# Patient Record
Sex: Male | Born: 1955 | Race: Black or African American | Hispanic: No | Marital: Single | Smoking: Current every day smoker
Health system: Southern US, Community
[De-identification: ages and names within clinical notes are randomized; demographics above are authoritative.]

## PROBLEM LIST (undated history)

## (undated) DIAGNOSIS — C801 Malignant (primary) neoplasm, unspecified: Secondary | ICD-10-CM

## (undated) DIAGNOSIS — F329 Major depressive disorder, single episode, unspecified: Secondary | ICD-10-CM

## (undated) DIAGNOSIS — F32A Depression, unspecified: Secondary | ICD-10-CM

## (undated) DIAGNOSIS — F419 Anxiety disorder, unspecified: Secondary | ICD-10-CM

## (undated) HISTORY — PX: OTHER SURGICAL HISTORY: SHX169

## (undated) SURGERY — BRONCHOSCOPY, FLEXIBLE
Anesthesia: Moderate Sedation | Laterality: Bilateral

---

## 2004-03-16 ENCOUNTER — Other Ambulatory Visit: Payer: Self-pay

## 2004-04-20 ENCOUNTER — Other Ambulatory Visit: Payer: Self-pay

## 2004-04-20 ENCOUNTER — Emergency Department: Payer: Self-pay | Admitting: Emergency Medicine

## 2004-05-26 ENCOUNTER — Other Ambulatory Visit: Payer: Self-pay

## 2004-05-26 ENCOUNTER — Emergency Department: Payer: Self-pay | Admitting: Emergency Medicine

## 2004-06-19 ENCOUNTER — Inpatient Hospital Stay: Payer: Self-pay

## 2004-07-29 ENCOUNTER — Ambulatory Visit: Payer: Self-pay | Admitting: Family Medicine

## 2004-10-10 ENCOUNTER — Emergency Department: Payer: Self-pay | Admitting: General Practice

## 2006-02-16 ENCOUNTER — Emergency Department: Payer: Self-pay | Admitting: Emergency Medicine

## 2006-02-17 ENCOUNTER — Other Ambulatory Visit: Payer: Self-pay

## 2006-07-21 ENCOUNTER — Emergency Department: Payer: Self-pay | Admitting: Emergency Medicine

## 2006-07-21 ENCOUNTER — Other Ambulatory Visit: Payer: Self-pay

## 2009-08-15 ENCOUNTER — Emergency Department: Payer: Self-pay | Admitting: Emergency Medicine

## 2010-04-17 ENCOUNTER — Ambulatory Visit: Payer: Self-pay | Admitting: Family Medicine

## 2011-11-05 ENCOUNTER — Emergency Department: Payer: Self-pay | Admitting: Emergency Medicine

## 2011-11-05 LAB — CBC
HCT: 41.8 % (ref 40.0–52.0)
HGB: 13.3 g/dL (ref 13.0–18.0)
MCV: 81 fL (ref 80–100)
RDW: 15.4 % — ABNORMAL HIGH (ref 11.5–14.5)
WBC: 4.4 10*3/uL (ref 3.8–10.6)

## 2011-11-05 LAB — COMPREHENSIVE METABOLIC PANEL
Calcium, Total: 8.6 mg/dL (ref 8.5–10.1)
Chloride: 107 mmol/L (ref 98–107)
EGFR (African American): >60
EGFR (Non-African Amer.): >60
Glucose: 94 mg/dL (ref 65–99)
Potassium: 3.9 mmol/L (ref 3.5–5.1)
SGOT(AST): 39 U/L — ABNORMAL HIGH (ref 15–37)
SGPT (ALT): 23 U/L
Sodium: 138 mmol/L (ref 136–145)
Total Protein: 7.9 g/dL (ref 6.4–8.2)

## 2011-11-05 LAB — URINALYSIS, COMPLETE
Bilirubin,UR: NEGATIVE
Glucose,UR: NEGATIVE mg/dL (ref 0–75)
Ketone: NEGATIVE
Nitrite: NEGATIVE
Ph: 5 (ref 4.5–8.0)

## 2011-11-05 LAB — DRUG SCREEN, URINE
Amphetamines, Ur Screen: NEGATIVE (ref ?–1000)
Barbiturates, Ur Screen: NEGATIVE (ref ?–200)
Benzodiazepine, Ur Scrn: NEGATIVE (ref ?–200)
Cannabinoid 50 Ng, Ur ~~LOC~~: NEGATIVE (ref ?–50)
Tricyclic, Ur Screen: NEGATIVE (ref ?–1000)

## 2011-11-05 LAB — TSH: Thyroid Stimulating Horm: 1.48 u[IU]/mL

## 2011-11-05 LAB — SALICYLATE LEVEL: Salicylates, Serum: 2.9 mg/dL — ABNORMAL HIGH

## 2011-11-05 LAB — ETHANOL: Ethanol: 27 mg/dL

## 2012-08-20 ENCOUNTER — Emergency Department: Payer: Self-pay | Admitting: Emergency Medicine

## 2014-07-07 ENCOUNTER — Emergency Department: Payer: Self-pay | Admitting: Emergency Medicine

## 2014-07-07 LAB — DRUG SCREEN, URINE
Amphetamines, Ur Screen: NEGATIVE (ref ?–1000)
BARBITURATES, UR SCREEN: NEGATIVE (ref ?–200)
Benzodiazepine, Ur Scrn: NEGATIVE (ref ?–200)
CANNABINOID 50 NG, UR ~~LOC~~: NEGATIVE (ref ?–50)
COCAINE METABOLITE, UR ~~LOC~~: POSITIVE (ref ?–300)
MDMA (Ecstasy)Ur Screen: NEGATIVE (ref ?–500)
Methadone, Ur Screen: NEGATIVE (ref ?–300)
OPIATE, UR SCREEN: NEGATIVE (ref ?–300)
Phencyclidine (PCP) Ur S: NEGATIVE (ref ?–25)
TRICYCLIC, UR SCREEN: NEGATIVE (ref ?–1000)

## 2014-07-07 LAB — URINALYSIS, COMPLETE
BACTERIA: NONE SEEN
Bilirubin,UR: NEGATIVE
Blood: NEGATIVE
GLUCOSE, UR: NEGATIVE mg/dL (ref 0–75)
Ketone: NEGATIVE
LEUKOCYTE ESTERASE: NEGATIVE
Nitrite: NEGATIVE
Ph: 5 (ref 4.5–8.0)
Protein: NEGATIVE
RBC, UR: NONE SEEN /HPF (ref 0–5)
Specific Gravity: 1.002 (ref 1.003–1.030)
Squamous Epithelial: <1
WBC UR: 1 /HPF (ref 0–5)

## 2014-07-07 LAB — CBC
HCT: 46.5 % (ref 40.0–52.0)
HGB: 15 g/dL (ref 13.0–18.0)
MCH: 25.8 pg — ABNORMAL LOW (ref 26.0–34.0)
MCHC: 32.3 g/dL (ref 32.0–36.0)
MCV: 80 fL (ref 80–100)
Platelet: 208 10*3/uL (ref 150–440)
RBC: 5.81 10*6/uL (ref 4.40–5.90)
RDW: 15.3 % — AB (ref 11.5–14.5)
WBC: 5.9 10*3/uL (ref 3.8–10.6)

## 2014-07-07 LAB — COMPREHENSIVE METABOLIC PANEL
ALBUMIN: 4 g/dL (ref 3.4–5.0)
ALK PHOS: 70 U/L
Anion Gap: 9 (ref 7–16)
BUN: 7 mg/dL (ref 7–18)
Bilirubin,Total: 0.3 mg/dL (ref 0.2–1.0)
CALCIUM: 8.4 mg/dL — AB (ref 8.5–10.1)
Chloride: 108 mmol/L — ABNORMAL HIGH (ref 98–107)
Co2: 25 mmol/L (ref 21–32)
Creatinine: 0.96 mg/dL (ref 0.60–1.30)
EGFR (African American): >60
EGFR (Non-African Amer.): >60
GLUCOSE: 88 mg/dL (ref 65–99)
OSMOLALITY: 281 (ref 275–301)
Potassium: 4.2 mmol/L (ref 3.5–5.1)
SGOT(AST): 58 U/L — ABNORMAL HIGH (ref 15–37)
SGPT (ALT): 35 U/L
SODIUM: 142 mmol/L (ref 136–145)
TOTAL PROTEIN: 8.4 g/dL — AB (ref 6.4–8.2)

## 2014-07-07 LAB — ACETAMINOPHEN LEVEL: Acetaminophen: <2 ug/mL

## 2014-07-07 LAB — ETHANOL: ETHANOL LVL: 296 mg/dL

## 2014-07-07 LAB — SALICYLATE LEVEL: SALICYLATES, SERUM: 3.3 mg/dL — AB

## 2014-10-20 NOTE — Consult Note (Signed)
Brief Consult Note: Diagnosis: Alcohol dependence, cocaine dependence, substance inbduced mood disorder.   Patient was seen by consultant.   Consult note dictated.   Recommend further assessment or treatment.   Orders entered.   Discussed with Attending MD.   Comments: Mr. Frontera has a h/o alcoholism and mood instability. He was discharged fron [facility] on 5/3. He relapsed on alcohol imediately drinking a case of beer daily. He came to the ER complaining of auditory command hallucinations requesting rehab. He did not require alcohol detox. He is no longer suicidal or homicidal. He is no longer psychotic.   PLAN: 1. The patient is on IVC.  2. He will be transferred to [facility] re[facility] facility today.   3. He is to continue current medications of Haldol and Trazodone.  4. He was started on Cipro on 5/11 for UTI.  Electronic Signatures: Orson Slick (MD)  (Signed 13-May-13 19:11)  Authored: Brief Consult Note   Last Updated: 13-May-13 19:11 by Orson Slick (MD)

## 2014-10-20 NOTE — Consult Note (Signed)
PATIENT NAME:  Wayne Chapman, Wayne Chapman MR#:  [id number] DATE OF BIRTH:  1956/04/29  DATE OF CONSULTATION:  11/08/2011  REFERRING PHYSICIAN:  Graciella Freer, MD  CONSULTING PHYSICIAN:  Jolanta B. Pucilowska, MD  REASON FOR CONSULTATION: To evaluate a patient with alcoholism.   IDENTIFYING DATA: Mr. Bolar is a 59 year old male with history of alcoholism and psychotic depression.   CHIEF COMPLAINT: "I hear voices".   HISTORY OF PRESENT ILLNESS: Mr. Dismuke reports that he was just detoxed from alcohol at [facility] in [facility] and immediately relapsed on alcohol. He reports drinking a case of beer a day and having auditory command hallucinations telling him to kill himself. He came to the Emergency Room asking for long-term treatment. He reportedly did see Dr. Jacqualine Code at [facility] but does not have access to his medication of Haldol and trazodone that he finds useful. In the Emergency Room, he was given his medications. He did not need alcohol detox. His vital signs were stable and he did not experience any symptoms of withdrawal. He was referred to [facility] rehab facility. The patient reports symptoms of depression with poor sleep, decreased appetite, anhedonia, feeling of guilt, hopelessness, worthlessness, crying, social isolation, low energy, poor memory and concentration. He tends to blame his bipolar rather than his drinking for it. He realizes that he was unrealistic believing that five day detox was enough and now wants to get proper rehab and would appreciate placement in a treatment center. He reports on and off psychotic symptoms that are usually exacerbated by substance abuse.   PAST PSYCHIATRIC HISTORY: He reportedly was diagnosed with depression at the age of 72 at [facility] where he was also hospitalized for alcoholism. He denies suicide attempt, participation with substance abuse treatment program.   FAMILY PSYCHIATRIC HISTORY: Multiple family members with alcoholism.   ALLERGIES: No  known drug allergies.   MEDICATIONS ON ADMISSION:  1. Haldol 2 mg at night. 2. Trazodone 100 mg at night.   SOCIAL HISTORY: He is homeless and unemployed. He has no resources, no health insurance. He has not been employed for several years after he lost his job at [employer]. He is not married and reports having no children.   REVIEW OF SYSTEMS: CONSTITUTIONAL: No fevers or chills. Positive for fatigue. No weight changes. EYES: No double or blurred vision. ENT: No hearing loss. RESPIRATORY: No shortness of breath or cough. CARDIOVASCULAR: No chest pain or orthopnea. GASTROINTESTINAL: No abdominal pain, nausea, vomiting, or diarrhea. GU: No incontinence or frequency. ENDOCRINE: No heat or cold intolerance. LYMPHATIC: No anemia or easy bruising. INTEGUMENTARY: No acne or rash. MUSCULOSKELETAL: No muscle or joint pain. NEUROLOGIC: No tingling or weakness. PSYCHIATRIC: See history of present illness for details.   PHYSICAL EXAMINATION:   VITAL SIGNS: Blood pressure 119/71, pulse 55, respirations 16, temperature 96.2.   GENERAL: This is a slender male in no acute distress. The rest of the physical examination is deferred to his primary attending.   LABORATORY DATA: Chemistries are within normal limits. Blood alcohol level 0.027. LFTs within normal limits except for AST of 39. TSH 1.48. Urine tox screen positive for cocaine. CBC within normal limits. Urinalysis is suggestive of urinary tract infection confirmed by urine culture. Serum acetaminophen less than 2. Serum salicylates 2.9.   MENTAL STATUS EXAMINATION: The patient is alert and oriented to person, place, time, and situation. He is pleasant, polite, and cooperative. He is wearing hospital scrubs. He is adequately groomed. He took a shower today. He maintains  good eye contact. His speech is of normal rhythm, rate, and volume. Mood is fine with full affect. Thought processing is logical and goal oriented. Thought content he denies suicidal or homicidal  ideation. There are no delusions or paranoia. There are no auditory or visual hallucinations. His cognition is grossly intact. His insight and judgment are questionable.   SUICIDE RISK ASSESSMENT: This is a patient with a lifelong history of substance abuse and mood problems who became psychotic after he relapsed on alcohol. He now is ready to start substance abuse rehab program.    DIAGNOSES:  AXIS I:  1. Alcohol dependence.  2. Cocaine dependence.  3. Mood disorder, not otherwise specified.   AXIS II: Deferred.   AXIS III: None.   AXIS IV: Mental illness, substance abuse, financial, employment, housing, primary support, access to care.   AXIS V: GAF 40.   PLAN:  1. The patient was accepted to [facility] re[facility] facility. He will be transferred today.  2. He is to continue trazodone and Haldol.  3. He was started on Cipro on May 11th for urinary tract infection and is to continue treatment.   ____________________________ Wardell Honour. Bary Leriche, MD jbp:drc D: 11/08/2011 19:20:17 ET T: 11/09/2011 10:13:32 ET JOB#: [id number]  cc: Jolanta B. Bary Leriche, MD, <Dictator> Clovis Fredrickson MD ELECTRONICALLY SIGNED 11/09/2011 22:16

## 2017-08-09 ENCOUNTER — Emergency Department: Payer: Medicare Other

## 2017-08-09 ENCOUNTER — Inpatient Hospital Stay
Admission: EM | Admit: 2017-08-09 | Discharge: 2017-08-12 | DRG: 177 | Disposition: A | Discharge status: Home / Self Care | Payer: Medicare Other | Attending: Internal Medicine | Admitting: Internal Medicine

## 2017-08-09 ENCOUNTER — Other Ambulatory Visit: Payer: Self-pay

## 2017-08-09 ENCOUNTER — Encounter: Payer: Self-pay | Admitting: Emergency Medicine

## 2017-08-09 DIAGNOSIS — R042 Hemoptysis: Secondary | ICD-10-CM | POA: Diagnosis present

## 2017-08-09 DIAGNOSIS — E876 Hypokalemia: Secondary | ICD-10-CM | POA: Diagnosis present

## 2017-08-09 DIAGNOSIS — R112 Nausea with vomiting, unspecified: Secondary | ICD-10-CM

## 2017-08-09 DIAGNOSIS — F17210 Nicotine dependence, cigarettes, uncomplicated: Secondary | ICD-10-CM | POA: Diagnosis present

## 2017-08-09 DIAGNOSIS — Z681 Body mass index (BMI) 19 or less, adult: Secondary | ICD-10-CM

## 2017-08-09 DIAGNOSIS — J984 Other disorders of lung: Secondary | ICD-10-CM | POA: Diagnosis present

## 2017-08-09 DIAGNOSIS — X58XXXA Exposure to other specified factors, initial encounter: Secondary | ICD-10-CM | POA: Diagnosis present

## 2017-08-09 DIAGNOSIS — F1410 Cocaine abuse, uncomplicated: Secondary | ICD-10-CM | POA: Diagnosis present

## 2017-08-09 DIAGNOSIS — E871 Hypo-osmolality and hyponatremia: Secondary | ICD-10-CM | POA: Diagnosis present

## 2017-08-09 DIAGNOSIS — Z8611 Personal history of tuberculosis: Secondary | ICD-10-CM

## 2017-08-09 DIAGNOSIS — R61 Generalized hyperhidrosis: Secondary | ICD-10-CM | POA: Diagnosis present

## 2017-08-09 DIAGNOSIS — J690 Pneumonitis due to inhalation of food and vomit: Secondary | ICD-10-CM | POA: Diagnosis present

## 2017-08-09 DIAGNOSIS — T17818A Gastric contents in other parts of respiratory tract causing other injury, initial encounter: Secondary | ICD-10-CM | POA: Diagnosis present

## 2017-08-09 DIAGNOSIS — Z7151 Drug abuse counseling and surveillance of drug abuser: Secondary | ICD-10-CM

## 2017-08-09 DIAGNOSIS — Z8659 Personal history of other mental and behavioral disorders: Secondary | ICD-10-CM

## 2017-08-09 DIAGNOSIS — E43 Unspecified severe protein-calorie malnutrition: Secondary | ICD-10-CM

## 2017-08-09 DIAGNOSIS — J850 Gangrene and necrosis of lung: Secondary | ICD-10-CM | POA: Diagnosis not present

## 2017-08-09 DIAGNOSIS — B9689 Other specified bacterial agents as the cause of diseases classified elsewhere: Secondary | ICD-10-CM | POA: Diagnosis present

## 2017-08-09 DIAGNOSIS — R402413 Glasgow coma scale score 13-15, at hospital admission: Secondary | ICD-10-CM | POA: Diagnosis present

## 2017-08-09 DIAGNOSIS — F1020 Alcohol dependence, uncomplicated: Secondary | ICD-10-CM | POA: Diagnosis present

## 2017-08-09 DIAGNOSIS — Z23 Encounter for immunization: Secondary | ICD-10-CM

## 2017-08-09 HISTORY — DX: Major depressive disorder, single episode, unspecified: F32.9

## 2017-08-09 HISTORY — DX: Depression, unspecified: F32.A

## 2017-08-09 HISTORY — DX: Anxiety disorder, unspecified: F41.9

## 2017-08-09 LAB — URINALYSIS, COMPLETE (UACMP) WITH MICROSCOPIC
BACTERIA UA: NONE SEEN
Bilirubin Urine: NEGATIVE
Glucose, UA: NEGATIVE mg/dL
Ketones, ur: NEGATIVE mg/dL
Leukocytes, UA: NEGATIVE
Nitrite: NEGATIVE
PH: 7 (ref 5.0–8.0)
Protein, ur: NEGATIVE mg/dL
SPECIFIC GRAVITY, URINE: 1.003 — AB (ref 1.005–1.030)
Squamous Epithelial / LPF: NONE SEEN

## 2017-08-09 LAB — URINE DRUG SCREEN, QUALITATIVE (ARMC ONLY)
Amphetamines, Ur Screen: NOT DETECTED
BENZODIAZEPINE, UR SCRN: NOT DETECTED
Barbiturates, Ur Screen: NOT DETECTED
Cannabinoid 50 Ng, Ur ~~LOC~~: NOT DETECTED
Cocaine Metabolite,Ur ~~LOC~~: POSITIVE — AB
MDMA (Ecstasy)Ur Screen: NOT DETECTED
METHADONE SCREEN, URINE: NOT DETECTED
OPIATE, UR SCREEN: NOT DETECTED
Phencyclidine (PCP) Ur S: NOT DETECTED
Tricyclic, Ur Screen: NOT DETECTED

## 2017-08-09 LAB — CBC WITH DIFFERENTIAL/PLATELET
BASOS ABS: 0 10*3/uL (ref 0–0.1)
Basophils Relative: 0 %
Eosinophils Absolute: 0 10*3/uL (ref 0–0.7)
Eosinophils Relative: 0 %
HEMATOCRIT: 41.2 % (ref 40.0–52.0)
Hemoglobin: 13.4 g/dL (ref 13.0–18.0)
LYMPHS PCT: 11 %
Lymphs Abs: 0.9 10*3/uL — ABNORMAL LOW (ref 1.0–3.6)
MCH: 24.9 pg — ABNORMAL LOW (ref 26.0–34.0)
MCHC: 32.5 g/dL (ref 32.0–36.0)
MCV: 76.6 fL — AB (ref 80.0–100.0)
MONO ABS: 1.5 10*3/uL — AB (ref 0.2–1.0)
Monocytes Relative: 18 %
NEUTROS ABS: 5.9 10*3/uL (ref 1.4–6.5)
Neutrophils Relative %: 71 %
Platelets: 218 10*3/uL (ref 150–440)
RBC: 5.38 MIL/uL (ref 4.40–5.90)
RDW: 15 % — AB (ref 11.5–14.5)
WBC: 8.3 10*3/uL (ref 3.8–10.6)

## 2017-08-09 LAB — TROPONIN I: Troponin I: <0.03 ng/mL (ref <0.03)

## 2017-08-09 LAB — COMPREHENSIVE METABOLIC PANEL
ALT: 13 U/L — AB (ref 17–63)
AST: 26 U/L (ref 15–41)
Albumin: 3.1 g/dL — ABNORMAL LOW (ref 3.5–5.0)
Alkaline Phosphatase: 65 U/L (ref 38–126)
Anion gap: 10 (ref 5–15)
BILIRUBIN TOTAL: 0.8 mg/dL (ref 0.3–1.2)
BUN: 7 mg/dL (ref 6–20)
CALCIUM: 8.3 mg/dL — AB (ref 8.9–10.3)
CO2: 28 mmol/L (ref 22–32)
CREATININE: 0.72 mg/dL (ref 0.61–1.24)
Chloride: 89 mmol/L — ABNORMAL LOW (ref 101–111)
GFR calc Af Amer: >60 mL/min (ref >60)
Glucose, Bld: 124 mg/dL — ABNORMAL HIGH (ref 65–99)
POTASSIUM: 3 mmol/L — AB (ref 3.5–5.1)
Sodium: 127 mmol/L — ABNORMAL LOW (ref 135–145)
TOTAL PROTEIN: 7.8 g/dL (ref 6.5–8.1)

## 2017-08-09 LAB — TSH: TSH: 1.377 u[IU]/mL (ref 0.350–4.500)

## 2017-08-09 LAB — EXPECTORATED SPUTUM ASSESSMENT W REFEX TO RESP CULTURE: SPECIAL REQUESTS: NORMAL

## 2017-08-09 LAB — INFLUENZA PANEL BY PCR (TYPE A & B)
INFLBPCR: NEGATIVE
Influenza A By PCR: NEGATIVE

## 2017-08-09 LAB — MRSA PCR SCREENING: MRSA BY PCR: NEGATIVE

## 2017-08-09 LAB — GLUCOSE, CAPILLARY: Glucose-Capillary: 115 mg/dL — ABNORMAL HIGH (ref 65–99)

## 2017-08-09 LAB — LIPASE, BLOOD: LIPASE: 17 U/L (ref 11–51)

## 2017-08-09 LAB — EXPECTORATED SPUTUM ASSESSMENT W GRAM STAIN, RFLX TO RESP C

## 2017-08-09 LAB — ETHANOL: Alcohol, Ethyl (B): <10 mg/dL (ref <10)

## 2017-08-09 MED ORDER — ACETAMINOPHEN 650 MG RE SUPP: 650.0000 mg | Freq: Four times a day (QID) | RECTAL | Status: DC | PRN

## 2017-08-09 MED ORDER — ONDANSETRON HCL 4 MG/2ML IJ SOLN
4.0000 mg | Freq: Once | INTRAMUSCULAR | Status: AC
  Administered 2017-08-09: 4 mg via INTRAVENOUS
  Filled 2017-08-09: qty 2

## 2017-08-09 MED ORDER — ONDANSETRON HCL 4 MG/2ML IJ SOLN: 4.0000 mg | Freq: Four times a day (QID) | INTRAMUSCULAR | Status: DC | PRN

## 2017-08-09 MED ORDER — ENSURE ENLIVE PO LIQD
237.0000 mL | Freq: Three times a day (TID) | ORAL | Status: DC
  Administered 2017-08-09 – 2017-08-12 (×7): 237 mL via ORAL

## 2017-08-09 MED ORDER — PIPERACILLIN-TAZOBACTAM 3.375 G IVPB
3.3750 g | Freq: Three times a day (TID) | INTRAVENOUS | Status: DC
  Administered 2017-08-09 (×2): 3.375 g via INTRAVENOUS
  Filled 2017-08-09 (×2): qty 50

## 2017-08-09 MED ORDER — VANCOMYCIN HCL IN DEXTROSE 1-5 GM/200ML-% IV SOLN
1000.0000 mg | Freq: Two times a day (BID) | INTRAVENOUS | Status: DC
  Administered 2017-08-09: 1000 mg via INTRAVENOUS
  Filled 2017-08-09 (×2): qty 200

## 2017-08-09 MED ORDER — PIPERACILLIN-TAZOBACTAM 3.375 G IVPB
3.3750 g | Freq: Three times a day (TID) | INTRAVENOUS | Status: DC
  Administered 2017-08-10 – 2017-08-12 (×8): 3.375 g via INTRAVENOUS
  Filled 2017-08-09 (×8): qty 50

## 2017-08-09 MED ORDER — KETOROLAC TROMETHAMINE 30 MG/ML IJ SOLN
30.0000 mg | Freq: Once | INTRAMUSCULAR | Status: AC
  Administered 2017-08-09: 30 mg via INTRAVENOUS
  Filled 2017-08-09: qty 1

## 2017-08-09 MED ORDER — ACETAMINOPHEN 325 MG PO TABS: 650.0000 mg | ORAL_TABLET | Freq: Four times a day (QID) | ORAL | Status: DC | PRN

## 2017-08-09 MED ORDER — SODIUM CHLORIDE 0.9 % IV BOLUS (SEPSIS)
1000.0000 mL | Freq: Once | INTRAVENOUS | Status: AC
  Administered 2017-08-09: 1000 mL via INTRAVENOUS

## 2017-08-09 MED ORDER — SODIUM CHLORIDE 0.9 % IV SOLN
INTRAVENOUS | Status: DC
  Administered 2017-08-09: 09:00:00 via INTRAVENOUS

## 2017-08-09 MED ORDER — INFLUENZA VAC SPLIT QUAD 0.5 ML IM SUSY
0.5000 mL | PREFILLED_SYRINGE | INTRAMUSCULAR | Status: AC
  Administered 2017-08-11: 0.5 mL via INTRAMUSCULAR
  Filled 2017-08-09: qty 0.5

## 2017-08-09 MED ORDER — ONDANSETRON HCL 4 MG PO TABS: 4.0000 mg | ORAL_TABLET | Freq: Four times a day (QID) | ORAL | Status: DC | PRN

## 2017-08-09 MED ORDER — CEFTRIAXONE SODIUM 1 G IJ SOLR: 1.0000 g | Freq: Once | INTRAMUSCULAR | Status: DC

## 2017-08-09 MED ORDER — ADULT MULTIVITAMIN W/MINERALS CH
1.0000 | ORAL_TABLET | Freq: Every day | ORAL | Status: DC
  Administered 2017-08-09 – 2017-08-12 (×4): 1 via ORAL
  Filled 2017-08-09 (×4): qty 1

## 2017-08-09 MED ORDER — DOCUSATE SODIUM 100 MG PO CAPS
100.0000 mg | ORAL_CAPSULE | Freq: Two times a day (BID) | ORAL | Status: DC
  Administered 2017-08-09 – 2017-08-10 (×3): 100 mg via ORAL
  Filled 2017-08-09 (×6): qty 1

## 2017-08-09 MED ORDER — FOLIC ACID 1 MG PO TABS
1.0000 mg | ORAL_TABLET | Freq: Every day | ORAL | Status: DC
  Administered 2017-08-09 – 2017-08-12 (×4): 1 mg via ORAL
  Filled 2017-08-09 (×4): qty 1

## 2017-08-09 MED ORDER — SODIUM CHLORIDE 0.9 % IV SOLN
500.0000 mg | Freq: Once | INTRAVENOUS | Status: AC
  Administered 2017-08-09: 500 mg via INTRAVENOUS
  Filled 2017-08-09: qty 500

## 2017-08-09 MED ORDER — LORAZEPAM 2 MG/ML IJ SOLN: 1.0000 mg | Freq: Four times a day (QID) | INTRAMUSCULAR | Status: AC | PRN

## 2017-08-09 MED ORDER — LORAZEPAM 2 MG/ML IJ SOLN: 0.0000 mg | Freq: Two times a day (BID) | INTRAMUSCULAR | Status: DC

## 2017-08-09 MED ORDER — LORAZEPAM 1 MG PO TABS: 1.0000 mg | ORAL_TABLET | Freq: Four times a day (QID) | ORAL | Status: AC | PRN

## 2017-08-09 MED ORDER — POTASSIUM CHLORIDE IN NACL 20-0.9 MEQ/L-% IV SOLN
INTRAVENOUS | Status: DC
  Administered 2017-08-09 – 2017-08-10 (×2): via INTRAVENOUS
  Filled 2017-08-09 (×3): qty 1000

## 2017-08-09 MED ORDER — IOPAMIDOL (ISOVUE-300) INJECTION 61%
75.0000 mL | Freq: Once | INTRAVENOUS | Status: AC | PRN
  Administered 2017-08-09: 75 mL via INTRAVENOUS

## 2017-08-09 MED ORDER — VANCOMYCIN HCL IN DEXTROSE 1-5 GM/200ML-% IV SOLN
1000.0000 mg | Freq: Once | INTRAVENOUS | Status: AC
  Administered 2017-08-09: 1000 mg via INTRAVENOUS
  Filled 2017-08-09: qty 200

## 2017-08-09 MED ORDER — SODIUM CHLORIDE 0.9 % IV SOLN
1.0000 g | Freq: Three times a day (TID) | INTRAVENOUS | Status: DC
  Administered 2017-08-09: 1 g via INTRAVENOUS
  Filled 2017-08-09 (×3): qty 1

## 2017-08-09 MED ORDER — THIAMINE HCL 100 MG/ML IJ SOLN: 100.0000 mg | Freq: Every day | INTRAMUSCULAR | Status: DC

## 2017-08-09 MED ORDER — POTASSIUM CHLORIDE CRYS ER 20 MEQ PO TBCR
40.0000 meq | EXTENDED_RELEASE_TABLET | ORAL | Status: AC
  Administered 2017-08-09 (×2): 40 meq via ORAL
  Filled 2017-08-09: qty 2

## 2017-08-09 MED ORDER — LORAZEPAM 2 MG/ML IJ SOLN: 0.0000 mg | Freq: Four times a day (QID) | INTRAMUSCULAR | Status: DC

## 2017-08-09 MED ORDER — VITAMIN B-1 100 MG PO TABS
100.0000 mg | ORAL_TABLET | Freq: Every day | ORAL | Status: DC
  Administered 2017-08-09 – 2017-08-12 (×4): 100 mg via ORAL
  Filled 2017-08-09 (×4): qty 1

## 2017-08-09 NOTE — Care Management (Signed)
RNCM team to follow for medication needs that can be determined closer to discharge. Patient placed in OBServation bed in ICU unit. Patient is currently being tested for TB also.

## 2017-08-09 NOTE — ED Triage Notes (Signed)
Patient complaining of emesis, coughing up blood and abd pain and black stools.  Patient coming in EMS from home and given 215ml of NS on truck. Patient did some recreational drugs on Saturday.

## 2017-08-09 NOTE — Consult Note (Signed)
Pharmacy Antibiotic Note  Wayne Chapman is a 62 y.o. male admitted on 08/09/2017 with Necrotizing Pneumonia.   Pharmacy has been consulted for Vancomycin and Zosyn  Dosing. Patient received Vancomycin 1g IV x 1 dose.   Plan: Ke: 0.071   VD: 42.4  T1.2: 9.76  Start vancomycin 1g IV every 12 hours with 6 hour stack dosing. Trough ordered prior to 4th dose. Calculated trough at Css is 19. Will monitor renal function and adjust dose as needed.   Start Zosyn 3.375 IV EI every 8 hours.    Height: 6\' 1"  (185.4 cm) Weight: 133 lb 6.1 oz (60.5 kg) IBW/kg (Calculated) : 79.9  Temp (24hrs), Avg:98.6 F (37 C), Min:98.1 F (36.7 C), Max:99.1 F (37.3 C)  Recent Labs  Lab 08/09/17 0116  WBC 8.3  CREATININE 0.72    Estimated Creatinine Clearance: 84 mL/min (by C-G formula based on SCr of 0.72 mg/dL).    No Known Allergies  Antimicrobials this admission: 2/12 vancomycin >>  2/12 Zosyn  >>   Dose adjustments this admission:  Microbiology results: 2/12 BCx: pending 2/12  MRSA PCR: negative   Thank you for allowing pharmacy to be a part of this patient's care.  Pernell Dupre, PharmD, BCPS Clinical Pharmacist 08/09/2017 11:22 AM

## 2017-08-09 NOTE — Consult Note (Signed)
[facility] Infectious Disease     Reason for Consult: PNA    Referring Physician: Claria Dice Date of Admission:  08/09/2017   Active Problems:   Hemoptysis   Protein-calorie malnutrition, severe   HPI: Wayne Chapman is a 62 y.o. male admitted with necrotizing PNA. He reports that he developed a cough approx 3 days ago after he celebrated his birtday on 2/8. He did drink more than usual that night and did vomit some.  He then developed worsening productive cough, sob and fevers. He has hx of tobacco and etoh use (3-4 40 oz beer daily) as well as cocaine use. He lives with his wife, is on disability. 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  He has a hx as well of incarceration.  On further questioning he does report a month long hx of not feeling well with some weight loss due to poor appetite and some night sweats.    Past Medical History:  Diagnosis Date  . Anxiety   . Depression    Past Surgical History:  Procedure Laterality Date  . none     Social History   Tobacco Use  . Smoking status: Current Every Day Smoker    Packs/day: 1.00    Types: Cigarettes  . Smokeless tobacco: Never Used  Substance Use Topics  . Alcohol use: Yes    Comment: alot   . Drug use: Yes   Family History  Problem Relation Age of Onset  . Diabetes Mellitus II Mother     Allergies: No Known Allergies  Current antibiotics: Antibiotics Given (last 72 hours)    Date/Time Action Medication Dose Rate   08/09/17 0723 New Bag/Given   meropenem (MERREM) 1 g in sodium chloride 0.9 % 100 mL IVPB 1 g 200 mL/hr   08/09/17 0724 New Bag/Given   azithromycin (ZITHROMAX) 500 mg in sodium chloride 0.9 % 250 mL IVPB 500 mg 250 mL/hr   08/09/17 0912 New Bag/Given   vancomycin (VANCOCIN) IVPB 1000 mg/200 mL premix 1,000 mg 200 mL/hr   08/09/17 0913 New Bag/Given   piperacillin-tazobactam (ZOSYN) IVPB 3.375 g 3.375 g 12.5 mL/hr       MEDICATIONS: . docusate sodium  100 mg Oral BID  . feeding supplement (ENSURE ENLIVE)  237 mL Oral TID BM  . folic acid  1 mg Oral Daily  . [START ON 08/10/2017] Influenza vac split quadrivalent PF  0.5 mL Intramuscular Tomorrow-1000  . LORazepam  0-4 mg Intravenous Q6H   Followed by  . [START ON 08/11/2017] LORazepam  0-4 mg Intravenous Q12H  . multivitamin with minerals  1 tablet Oral Daily  . thiamine  100 mg Oral Daily   Or  . thiamine  100 mg Intravenous Daily    Review of Systems - 11 systems reviewed and negative per HPI   OBJECTIVE: Temp:  [98.1 F (36.7 C)-99.1 F (37.3 C)] 98.1 F (36.7 C) (02/12 0830) Pulse Rate:  [73-102] 73 (02/12 0830) Resp:  [18-26] 22 (02/12 0730) BP: (86-107)/(65-68) 92/68 (02/12 0830) SpO2:  [97 %-99 %] 99 % (02/12 0730) Weight:  [60.5 kg (133 lb 6.1 oz)-73.5 kg (162 lb)] 60.5 kg (133 lb 6.1 oz) (02/12 0830) Physical Exam  Constitutional: He is oriented to person, place, and time. Thin, nad HENT: anicteric, muddy sclera Mouth/Throat: Oropharynx is clear and moist. Edentulous, has dentures Cardiovascular: Normal rate, regular rhythm and normal heart sounds. Exam  reveals no gallop and no friction rub.  No murmur heard.  Pulmonary/Chest: bil rhonhci Abdominal: Soft. Bowel sounds are normal. He exhibits no distension. There is no tenderness.  Lymphadenopathy:  He has no cervical adenopathy.  Neurological: He is alert and oriented to person, place, and time.  Skin: Skin is warm and dry. No rash noted. No erythema.  Psychiatric: He has a normal mood and affect. His behavior is normal.     LABS: Results for orders placed or performed during the hospital encounter of 08/09/17 (from the past 48 hour(s))  CBC with Differential     Status: Abnormal   Collection Time: 08/09/17  1:16 AM  Result Value Ref Range   WBC 8.3 3.8 - 10.6 K/uL   RBC 5.38 4.40 - 5.90 MIL/uL   Hemoglobin 13.4 13.0 - 18.0 g/dL   HCT 41.2 40.0 - 52.0 %   MCV 76.6 (L)  80.0 - 100.0 fL   MCH 24.9 (L) 26.0 - 34.0 pg   MCHC 32.5 32.0 - 36.0 g/dL   RDW 15.0 (H) 11.5 - 14.5 %   Platelets 218 150 - 440 K/uL   Neutrophils Relative % 71 %   Neutro Abs 5.9 1.4 - 6.5 K/uL   Lymphocytes Relative 11 %   Lymphs Abs 0.9 (L) 1.0 - 3.6 K/uL   Monocytes Relative 18 %   Monocytes Absolute 1.5 (H) 0.2 - 1.0 K/uL   Eosinophils Relative 0 %   Eosinophils Absolute 0.0 0 - 0.7 K/uL   Basophils Relative 0 %   Basophils Absolute 0.0 0 - 0.1 K/uL    Comment: Performed at [facility], [address] [place], [place] [address]  Comprehensive metabolic panel     Status: Abnormal   Collection Time: 08/09/17  1:16 AM  Result Value Ref Range   Sodium 127 (L) 135 - 145 mmol/L   Potassium 3.0 (L) 3.5 - 5.1 mmol/L   Chloride 89 (L) 101 - 111 mmol/L   CO2 28 22 - 32 mmol/L   Glucose, Bld 124 (H) 65 - 99 mg/dL   BUN 7 6 - 20 mg/dL   Creatinine, Ser 0.72 0.61 - 1.24 mg/dL   Calcium 8.3 (L) 8.9 - 10.3 mg/dL   Total Protein 7.8 6.5 - 8.1 g/dL   Albumin 3.1 (L) 3.5 - 5.0 g/dL   AST 26 15 - 41 U/L   ALT 13 (L) 17 - 63 U/L   Alkaline Phosphatase 65 38 - 126 U/L   Total Bilirubin 0.8 0.3 - 1.2 mg/dL   GFR calc non Af Amer >60 >60 mL/min   GFR calc Af Amer >60 >60 mL/min    Comment: (NOTE) The eGFR has been calculated using the CKD EPI equation. This calculation has not been validated in all clinical situations. eGFR's persistently <60 mL/min signify possible Chronic Kidney Disease.    Anion gap 10 5 - 15    Comment: Performed at [facility], [address] [place], [place] [address]  Lipase, blood     Status: None   Collection Time: 08/09/17  1:16 AM  Result Value Ref Range   Lipase 17 11 - 51 U/L    Comment: Performed at [facility], [address] [place], [place] [address]  Troponin I     Status: None   Collection Time: 08/09/17  1:16 AM  Result Value Ref Range   Troponin I <0.03 <0.03 ng/mL    Comment: Performed at [facility], [address] [place], [place] [address]  Ethanol     Status: None   Collection Time: 08/09/17  1:16 AM  Result Value Ref Range   Alcohol, Ethyl (B) <10 <10 mg/dL    Comment:        LOWEST DETECTABLE LIMIT FOR SERUM ALCOHOL IS 10 mg/dL FOR MEDICAL PURPOSES ONLY Performed at [facility], [address] [place], [place] [address]   Urinalysis, Complete w Microscopic     Status: Abnormal   Collection Time: 08/09/17  1:18 AM  Result Value Ref Range   Color, Urine YELLOW (A) YELLOW   APPearance CLEAR (A) CLEAR   Specific Gravity, Urine 1.003 (L) 1.005 - 1.030   pH 7.0 5.0 - 8.0   Glucose, UA NEGATIVE NEGATIVE mg/dL   Hgb urine dipstick SMALL (A) NEGATIVE   Bilirubin Urine NEGATIVE NEGATIVE   Ketones, ur NEGATIVE NEGATIVE mg/dL   Protein, ur NEGATIVE NEGATIVE mg/dL   Nitrite NEGATIVE NEGATIVE   Leukocytes, UA NEGATIVE NEGATIVE   RBC / HPF 0-5 0 - 5 RBC/hpf   WBC, UA 0-5 0 - 5 WBC/hpf   Bacteria, UA NONE SEEN NONE SEEN   Squamous Epithelial / LPF NONE SEEN NONE SEEN    Comment: Performed at [facility], [address] [place], [place] [address]  Urine Drug Screen, Qualitative ([facility] only)     Status: Abnormal   Collection Time: 08/09/17  1:18 AM  Result Value Ref Range   Tricyclic, Ur Screen NONE DETECTED NONE DETECTED   Amphetamines, Ur Screen NONE DETECTED NONE DETECTED   MDMA (Ecstasy)Ur Screen NONE DETECTED NONE DETECTED   Cocaine Metabolite,Ur [place] POSITIVE (A) NONE DETECTED   Opiate, Ur Screen NONE DETECTED NONE DETECTED   Phencyclidine (PCP) Ur S NONE DETECTED NONE DETECTED   Cannabinoid 50 Ng, Ur [place] NONE DETECTED NONE DETECTED   Barbiturates, Ur Screen NONE DETECTED NONE DETECTED   Benzodiazepine, Ur Scrn NONE DETECTED NONE DETECTED   Methadone Scn, Ur NONE DETECTED NONE DETECTED    Comment: (NOTE) Tricyclics + metabolites, urine    Cutoff 1000 ng/mL Amphetamines + metabolites, urine  Cutoff 1000 ng/mL MDMA (Ecstasy), urine               Cutoff 500 ng/mL Cocaine Metabolite, urine          Cutoff 300 ng/mL Opiate + metabolites, urine        Cutoff 300 ng/mL Phencyclidine (PCP), urine         Cutoff 25 ng/mL Cannabinoid, urine                 Cutoff 50 ng/mL Barbiturates + metabolites, urine  Cutoff 200 ng/mL Benzodiazepine, urine              Cutoff 200 ng/mL Methadone, urine                   Cutoff 300 ng/mL The urine drug screen provides only a preliminary, unconfirmed analytical test result and should not be used for non-medical purposes. Clinical consideration and professional judgment should be applied to any positive drug screen result due to possible interfering substances. A more specific alternate chemical method must be used in order to obtain a confirmed analytical result. Gas chromatography / mass spectrometry (GC/MS) is the preferred confirmat ory method. Performed at [facility], [address] [place], [place] [address]   Influenza panel by PCR (type A & B)     Status: None   Collection Time:  08/09/17  1:18 AM  Result Value Ref Range   Influenza A By PCR NEGATIVE NEGATIVE   Influenza B By PCR NEGATIVE NEGATIVE    Comment: (NOTE) The Xpert Xpress Flu assay is intended as an aid in the diagnosis of  influenza and should not be used as a sole basis for treatment.  This  assay is FDA approved for nasopharyngeal swab specimens only. Nasal  washings and aspirates are unacceptable for Xpert Xpress Flu testing. Performed at [facility], [address] [place], [place] [address]   Glucose, capillary     Status: Abnormal   Collection Time: 08/09/17  8:29 AM  Result Value Ref Range   Glucose-Capillary 115 (H) 65 - 99 mg/dL  MRSA PCR Screening     Status: None   Collection Time: 08/09/17  8:42 AM  Result Value Ref Range   MRSA by PCR NEGATIVE NEGATIVE    Comment:        The GeneXpert MRSA Assay (FDA approved for NASAL specimens only), is one component of a comprehensive MRSA  colonization surveillance program. It is not intended to diagnose MRSA infection nor to guide or monitor treatment for MRSA infections. Performed at [facility], [address] [place], [place] [address]   TSH     Status: None   Collection Time: 08/09/17 10:14 AM  Result Value Ref Range   TSH 1.377 0.350 - 4.500 uIU/mL    Comment: Performed by a 3rd Generation assay with a functional sensitivity of <=0.01 uIU/mL. Performed at [facility], [address] [place], [place] [address]   Culture, expectorated sputum-assessment     Status: None   Collection Time: 08/09/17 11:44 AM  Result Value Ref Range   Specimen Description Expect. Sput    Special Requests Normal    Sputum evaluation      THIS SPECIMEN IS ACCEPTABLE FOR SPUTUM CULTURE Performed at [facility], [address] [place], [place] [address]    Report Status 08/09/2017 FINAL    No components found for: ESR, C REACTIVE PROTEIN MICRO: Recent Results (from the past 720 hour(s))  MRSA PCR Screening     Status: None   Collection Time: 08/09/17  8:42 AM  Result Value Ref Range Status   MRSA by PCR NEGATIVE NEGATIVE Final    Comment:        The GeneXpert MRSA Assay (FDA approved for NASAL specimens only), is one component of a comprehensive MRSA colonization surveillance program. It is not intended to diagnose MRSA infection nor to guide or monitor treatment for MRSA infections. Performed at [facility], [address] [place], [place] [address]   Culture, expectorated sputum-assessment     Status: None   Collection Time: 08/09/17 11:44 AM  Result Value Ref Range Status   Specimen Description Expect. Sput  Final   Special Requests Normal  Final   Sputum evaluation   Final    THIS SPECIMEN IS ACCEPTABLE FOR SPUTUM CULTURE Performed at [facility], [address] [place], [place] [address]    Report Status 08/09/2017 FINAL  Final    IMAGING: Dg Chest 2  View  Result Date: 08/09/2017 CLINICAL DATA:  Acute onset of vomiting and hemoptysis. EXAM: CHEST  2 VIEW COMPARISON:  Chest radiograph performed 04/17/2010 FINDINGS: A new right apical cavitary mass is noted, measuring perhaps 9-10 cm. Malignancy is a concern. Alternatively, atypical infection might have a similar appearance. No pleural effusion or pneumothorax is seen. The heart  is normal in size. No acute osseous abnormalities are identified. IMPRESSION: New right apical cavitary mass, measuring perhaps 9-10 cm. Malignancy is a concern. Alternatively, atypical infection might have a similar appearance. CT of the chest is recommended for further evaluation. These results were called by telephone at the time of interpretation on 08/09/2017 at 3:20 am to Dr. Charlesetta Ivory, who verbally acknowledged these results. Electronically Signed   By: Garald Balding M.D.   On: 08/09/2017 03:21   Ct Chest W Contrast  Result Date: 08/09/2017 CLINICAL DATA:  62 y/o M; emesis, hemoptysis, abdominal pain, black stools. EXAM: CT CHEST WITH CONTRAST TECHNIQUE: Multidetector CT imaging of the chest was performed during intravenous contrast administration. CONTRAST:  65m ISOVUE-300 IOPAMIDOL (ISOVUE-300) INJECTION 61% COMPARISON:  08/09/2017 chest radiograph FINDINGS: Cardiovascular: Normal caliber thoracic aorta with mild calcific atherosclerosis. Moderate coronary artery calcification. Normal heart size. No pericardial effusion. Mediastinum/Nodes: Right supraclavicular, right paratracheal, right hilar adenopathy. Right supraclavicular node measures 10 x 13 mm (series 2: Image 17), right lower paratracheal measures 25 x 22 mm (2:57), right lower hilar measures 13 x 18 mm (2:73). Additionally, there is a prominent left lower paratracheal node measuring 16 x 11 mm (2:57). Lungs/Pleura: Partial collapse of right lung with necrosis. Confluent consolidation/soft tissue is present extending into the right hilum and along course of  right mainstem bronchus to the carina. Focus of consolidation right lower lobe superior segment. Bronchiolitis in the right lung base. No pleural effusion or pneumothorax. Upper Abdomen: No acute abnormality. Musculoskeletal: No chest wall abnormality. No acute or significant osseous findings. IMPRESSION: 1. Partial collapse and necrosis of the right upper lobe probably representing necrotic pneumonia. Tuberculosis should be excluded if there is a risk of exposure. An underlying hilar mass with postobstructive necrotic pneumonia is possible. 2. Right supraclavicular, paratracheal, and hilar adenopathy. These results were called by telephone at the time of interpretation on 08/09/2017 at 5:05 am to Dr. ACharlesetta Ivory, who verbally acknowledged these results. Electronically Signed   By: LKristine GarbeM.D.   On: 08/09/2017 05:08    Assessment:   Wayne WESTHOFFis a 62y.o. male with hx of tobacco, etoh (3-4 40 oz beer daily) as well as cocaine use admitted with RUL necrotizing PNA.  He has hx Latent TB treated in 2007. Also has hx of incarceration. HIV test pending.  Flu PCR negative.  He likely has aspiration PNA given substance use but some concern for malignancy and post obstructive PNA esp given his LAN noted on CT but could be reactive. 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  The sputum at bedside is thick and brown in character and is a large amount.  Suspect mixed, anaerobic infection  Will need wu for TB, coverage for aspiration PNA, and eventual fu imaging. Will likely need prolonged oral therapy. Hopefully will have growth on culture to guide therapy.    Recommendations Since MRSA PCR neg can dc vanco.  Cont zosyn No need for azitrho  Thank you very much for allowing me to participate in the care of this patient. Please call with questions.   DCheral Marker FOla Spurr MD

## 2017-08-09 NOTE — H&P (Signed)
Wayne Chapman is an 62 y.o. male.   Chief Complaint: Vomiting HPI: The patient with past medical history of anxiety and depression presents to the emergency department complaining of vomiting and abdominal pain.  The patient states that he has been unable to keep food down for the last few days including alcohol with which he admits he has an ongoing problem.  The patient admits to weight loss of approximately 15-20 pounds over a matter of days (?)  As well as night sweats and hemoptysis.  CT of his chest revealed collapse and necrosis of the right upper lobe.  The patient was started on antibiotics prior to the emergency department staff called the hospitalist service for admission.  Past Medical History:  Diagnosis Date  . Anxiety   . Depression     Past Surgical History:  Procedure Laterality Date  . none      Family History  Problem Relation Age of Onset  . Diabetes Mellitus II Mother    Social History:  reports that he has been smoking cigarettes.  He has been smoking about 1.00 pack per day. He does not have any smokeless tobacco history on file. He reports that he drinks alcohol. He reports that he uses drugs.  Allergies: No Known Allergies  Prior to Admission medications   Not on File     Results for orders placed or performed during the hospital encounter of 08/09/17 (from the past 48 hour(s))  CBC with Differential     Status: Abnormal   Collection Time: 08/09/17  1:16 AM  Result Value Ref Range   WBC 8.3 3.8 - 10.6 K/uL   RBC 5.38 4.40 - 5.90 MIL/uL   Hemoglobin 13.4 13.0 - 18.0 g/dL   HCT 41.2 40.0 - 52.0 %   MCV 76.6 (L) 80.0 - 100.0 fL   MCH 24.9 (L) 26.0 - 34.0 pg   MCHC 32.5 32.0 - 36.0 g/dL   RDW 15.0 (H) 11.5 - 14.5 %   Platelets 218 150 - 440 K/uL   Neutrophils Relative % 71 %   Neutro Abs 5.9 1.4 - 6.5 K/uL   Lymphocytes Relative 11 %   Lymphs Abs 0.9 (L) 1.0 - 3.6 K/uL   Monocytes Relative 18 %   Monocytes Absolute 1.5 (H) 0.2 - 1.0 K/uL    Eosinophils Relative 0 %   Eosinophils Absolute 0.0 0 - 0.7 K/uL   Basophils Relative 0 %   Basophils Absolute 0.0 0 - 0.1 K/uL    Comment: Performed at [facility], [address] [place], [place] [address]  Comprehensive metabolic panel     Status: Abnormal   Collection Time: 08/09/17  1:16 AM  Result Value Ref Range   Sodium 127 (L) 135 - 145 mmol/L   Potassium 3.0 (L) 3.5 - 5.1 mmol/L   Chloride 89 (L) 101 - 111 mmol/L   CO2 28 22 - 32 mmol/L   Glucose, Bld 124 (H) 65 - 99 mg/dL   BUN 7 6 - 20 mg/dL   Creatinine, Ser 0.72 0.61 - 1.24 mg/dL   Calcium 8.3 (L) 8.9 - 10.3 mg/dL   Total Protein 7.8 6.5 - 8.1 g/dL   Albumin 3.1 (L) 3.5 - 5.0 g/dL   AST 26 15 - 41 U/L   ALT 13 (L) 17 - 63 U/L   Alkaline Phosphatase 65 38 - 126 U/L   Total Bilirubin 0.8 0.3 - 1.2 mg/dL   GFR calc non Af Amer >60 >60 mL/min  GFR calc Af Amer >60 >60 mL/min    Comment: (NOTE) The eGFR has been calculated using the CKD EPI equation. This calculation has not been validated in all clinical situations. eGFR's persistently <60 mL/min signify possible Chronic Kidney Disease.    Anion gap 10 5 - 15    Comment: Performed at [facility], [address] [place], [place] [address]  Lipase, blood     Status: None   Collection Time: 08/09/17  1:16 AM  Result Value Ref Range   Lipase 17 11 - 51 U/L    Comment: Performed at [facility], [address] [place], [place] [address]  Troponin I     Status: None   Collection Time: 08/09/17  1:16 AM  Result Value Ref Range   Troponin I <0.03 <0.03 ng/mL    Comment: Performed at [facility], [address] [place], [place] [address]  Ethanol     Status: None   Collection Time: 08/09/17  1:16 AM  Result Value Ref Range   Alcohol, Ethyl (B) <10 <10 mg/dL    Comment:        LOWEST DETECTABLE LIMIT FOR SERUM ALCOHOL IS 10 mg/dL FOR MEDICAL PURPOSES ONLY Performed at [facility], [address]  [place], [place] [address]   Urinalysis, Complete w Microscopic     Status: Abnormal   Collection Time: 08/09/17  1:18 AM  Result Value Ref Range   Color, Urine YELLOW (A) YELLOW   APPearance CLEAR (A) CLEAR   Specific Gravity, Urine 1.003 (L) 1.005 - 1.030   pH 7.0 5.0 - 8.0   Glucose, UA NEGATIVE NEGATIVE mg/dL   Hgb urine dipstick SMALL (A) NEGATIVE   Bilirubin Urine NEGATIVE NEGATIVE   Ketones, ur NEGATIVE NEGATIVE mg/dL   Protein, ur NEGATIVE NEGATIVE mg/dL   Nitrite NEGATIVE NEGATIVE   Leukocytes, UA NEGATIVE NEGATIVE   RBC / HPF 0-5 0 - 5 RBC/hpf   WBC, UA 0-5 0 - 5 WBC/hpf   Bacteria, UA NONE SEEN NONE SEEN   Squamous Epithelial / LPF NONE SEEN NONE SEEN    Comment: Performed at [facility], [address], [place], [place] [address]  Urine Drug Screen, Qualitative ([facility] only)     Status: Abnormal   Collection Time: 08/09/17  1:18 AM  Result Value Ref Range   Tricyclic, Ur Screen NONE DETECTED NONE DETECTED   Amphetamines, Ur Screen NONE DETECTED NONE DETECTED   MDMA (Ecstasy)Ur Screen NONE DETECTED NONE DETECTED   Cocaine Metabolite,Ur [place] POSITIVE (A) NONE DETECTED   Opiate, Ur Screen NONE DETECTED NONE DETECTED   Phencyclidine (PCP) Ur S NONE DETECTED NONE DETECTED   Cannabinoid 50 Ng, Ur [place] NONE DETECTED NONE DETECTED   Barbiturates, Ur Screen NONE DETECTED NONE DETECTED   Benzodiazepine, Ur Scrn NONE DETECTED NONE DETECTED   Methadone Scn, Ur NONE DETECTED NONE DETECTED    Comment: (NOTE) Tricyclics + metabolites, urine    Cutoff 1000 ng/mL Amphetamines + metabolites, urine  Cutoff 1000 ng/mL MDMA (Ecstasy), urine              Cutoff 500 ng/mL Cocaine Metabolite, urine          Cutoff 300 ng/mL Opiate + metabolites, urine        Cutoff 300 ng/mL Phencyclidine (PCP), urine         Cutoff 25 ng/mL Cannabinoid, urine                 Cutoff  50 ng/mL Barbiturates + metabolites, urine  Cutoff 200 ng/mL Benzodiazepine, urine              Cutoff 200  ng/mL Methadone, urine                   Cutoff 300 ng/mL The urine drug screen provides only a preliminary, unconfirmed analytical test result and should not be used for non-medical purposes. Clinical consideration and professional judgment should be applied to any positive drug screen result due to possible interfering substances. A more specific alternate chemical method must be used in order to obtain a confirmed analytical result. Gas chromatography / mass spectrometry (GC/MS) is the preferred confirmat ory method. Performed at [facility], [address] [place], [place] [address]   Influenza panel by PCR (type A & B)     Status: None   Collection Time: 08/09/17  1:18 AM  Result Value Ref Range   Influenza A By PCR NEGATIVE NEGATIVE   Influenza B By PCR NEGATIVE NEGATIVE    Comment: (NOTE) The Xpert Xpress Flu assay is intended as an aid in the diagnosis of  influenza and should not be used as a sole basis for treatment.  This  assay is FDA approved for nasopharyngeal swab specimens only. Nasal  washings and aspirates are unacceptable for Xpert Xpress Flu testing. Performed at [facility], [address] [place], [place] [address]    Dg Chest 2 View  Result Date: 08/09/2017 CLINICAL DATA:  Acute onset of vomiting and hemoptysis. EXAM: CHEST  2 VIEW COMPARISON:  Chest radiograph performed 04/17/2010 FINDINGS: A new right apical cavitary mass is noted, measuring perhaps 9-10 cm. Malignancy is a concern. Alternatively, atypical infection might have a similar appearance. No pleural effusion or pneumothorax is seen. The heart is normal in size. No acute osseous abnormalities are identified. IMPRESSION: New right apical cavitary mass, measuring perhaps 9-10 cm. Malignancy is a concern. Alternatively, atypical infection might have a similar appearance. CT of the chest is recommended for further evaluation. These results were called by telephone at the time of  interpretation on 08/09/2017 at 3:20 am to Dr. Charlesetta Ivory, who verbally acknowledged these results. Electronically Signed   By: Garald Balding M.D.   On: 08/09/2017 03:21   Ct Chest W Contrast  Result Date: 08/09/2017 CLINICAL DATA:  62 y/o M; emesis, hemoptysis, abdominal pain, black stools. EXAM: CT CHEST WITH CONTRAST TECHNIQUE: Multidetector CT imaging of the chest was performed during intravenous contrast administration. CONTRAST:  75m ISOVUE-300 IOPAMIDOL (ISOVUE-300) INJECTION 61% COMPARISON:  08/09/2017 chest radiograph FINDINGS: Cardiovascular: Normal caliber thoracic aorta with mild calcific atherosclerosis. Moderate coronary artery calcification. Normal heart size. No pericardial effusion. Mediastinum/Nodes: Right supraclavicular, right paratracheal, right hilar adenopathy. Right supraclavicular node measures 10 x 13 mm (series 2: Image 17), right lower paratracheal measures 25 x 22 mm (2:57), right lower hilar measures 13 x 18 mm (2:73). Additionally, there is a prominent left lower paratracheal node measuring 16 x 11 mm (2:57). Lungs/Pleura: Partial collapse of right lung with necrosis. Confluent consolidation/soft tissue is present extending into the right hilum and along course of right mainstem bronchus to the carina. Focus of consolidation right lower lobe superior segment. Bronchiolitis in the right lung base. No pleural effusion or pneumothorax. Upper Abdomen: No acute abnormality. Musculoskeletal: No chest wall abnormality. No acute or significant osseous findings. IMPRESSION: 1. Partial collapse and necrosis of the right upper lobe probably representing necrotic pneumonia. Tuberculosis should be excluded if  there is a risk of exposure. An underlying hilar mass with postobstructive necrotic pneumonia is possible. 2. Right supraclavicular, paratracheal, and hilar adenopathy. These results were called by telephone at the time of interpretation on 08/09/2017 at 5:05 am to Dr. Charlesetta Ivory  , who verbally acknowledged these results. Electronically Signed   By: Kristine Garbe M.D.   On: 08/09/2017 05:08    Review of Systems  Constitutional: Positive for chills and weight loss. Negative for fever.  HENT: Negative for sore throat and tinnitus.   Eyes: Negative for blurred vision and redness.  Respiratory: Positive for cough and hemoptysis. Negative for shortness of breath.   Cardiovascular: Negative for chest pain, palpitations, orthopnea and PND.  Gastrointestinal: Positive for abdominal pain and vomiting. Negative for diarrhea and nausea.  Genitourinary: Negative for dysuria, frequency and urgency.  Musculoskeletal: Negative for joint pain and myalgias.  Skin: Negative for rash.       No lesions  Neurological: Negative for speech change, focal weakness and weakness.  Endo/Heme/Allergies: Does not bruise/bleed easily.       No temperature intolerance  Psychiatric/Behavioral: Negative for depression and suicidal ideas.    Blood pressure 95/67, pulse 86, temperature 99.1 F (37.3 C), temperature source Oral, resp. rate 18, height _0  (1.854 m), weight 73.5 kg (162 lb), SpO2 97 %. Physical Exam  Vitals reviewed. Constitutional: He is oriented to person, place, and time. He appears well-developed and well-nourished. No distress.  HENT:  Head: Normocephalic and atraumatic.  Mouth/Throat: Oropharynx is clear and moist.  Eyes: Conjunctivae and EOM are normal. Pupils are equal, round, and reactive to light. No scleral icterus.  Neck: Normal range of motion. Neck supple. No JVD present. No tracheal deviation present. No thyromegaly present.  Cardiovascular: Normal rate, regular rhythm and normal heart sounds. Exam reveals no gallop and no friction rub.  No murmur heard. Respiratory: Effort normal and breath sounds normal. No respiratory distress.  GI: Soft. Bowel sounds are normal. He exhibits no distension. There is no tenderness.  Genitourinary:  Genitourinary  Comments: Deferred  Musculoskeletal: Normal range of motion. He exhibits no edema.  Lymphadenopathy:    He has no cervical adenopathy.  Neurological: He is alert and oriented to person, place, and time. No cranial nerve deficit.  Skin: Skin is warm and dry. No rash noted. No erythema.  Psychiatric: He has a normal mood and affect. His behavior is normal. Judgment and thought content normal.     Assessment/Plan This is a 62 year old male admitted for hemoptysis. 1.  Hemoptysis: Necrotizing pneumonia of the lung.  Differential diagnosis includes tuberculosis and Klebsiella.  The patient is on airborne precautions.  We have started meropenem for Klebsiella coverage and we will obtain a QuantiFERON gold assay to rule out TB.  Consult infectious disease.  No hypoxia.  Check HIV status.  (The patient admits to incarceration at one point). 2.  Hyponatremia: Secondary to chronic lung disease.  Hydrate with normal saline. 3.  Alcohol abuse: Predispose patient to opportunistic infection such as Klebsiella.  The patient is on a CIWA scale.  (The patient's last drink was 24 hours ago). 4.  Cocaine abuse: Counseled on cessation of polysubstance abuse. 5.  DVT prophylaxis: SCDs 6.  GI prophylaxis: None The patient is a full code.  Time spent on admission orders and patient care approximately 45 minutes  Harrie Foreman, MD 08/09/2017, 6:03 AM

## 2017-08-09 NOTE — ED Provider Notes (Signed)
[facility] Emergency Department Provider Note   ____________________________________________   First MD Initiated Contact with Patient 08/09/17 0109     (approximate)  I have reviewed the triage vital signs and the nursing notes.   HISTORY  Chief Complaint Emesis and Abdominal Pain    HPI Wayne Chapman is a 62 y.o. male who comes into the hospital today with some fever nausea and vomiting.  His emesis and stools are dark appearing with a concern for blood.  The patient is also had some chest pain for a couple of days.  2 days ago he used a recreational drug that he is not sure exactly what it was.  There is a possibility it could have been cocaine.  The patient states that normally he drinks but he was unable to keep down any alcohol today as well.  He also has not smokes.  The patient states that he is been having the symptoms for 3 days.  He has not taken his temperature at home but he has had cough and cold flashes with some sweats.  The patient has some generalized abdominal pain as well as some chest pain.  The patient rates his pain a 4 out of 10 in intensity.  He reports that when he coughs that it is blood.  He denies any sick contacts.  The patient is here today for evaluation.   Past Medical History:  Diagnosis Date  . Anxiety   . Depression     Patient Active Problem List   Diagnosis Date Noted  . Hemoptysis 08/09/2017    Past Surgical History:  Procedure Laterality Date  . none      Prior to Admission medications   Not on File    Allergies Patient has no known allergies.  Family History  Problem Relation Age of Onset  . Diabetes Mellitus II Mother     Social History Social History   Tobacco Use  . Smoking status: Current Every Day Smoker    Packs/day: 1.00    Types: Cigarettes  Substance Use Topics  . Alcohol use: Yes    Comment: alot   . Drug use: Yes    Review of Systems  Constitutional:  fever/chills Eyes: No  visual changes. ENT: No sore throat. Cardiovascular:  chest pain. Respiratory: cough and shortness of breath. Gastrointestinal:  abdominal pain.   nausea,  vomiting.  No diarrhea.  No constipation. Genitourinary: Negative for dysuria. Musculoskeletal: Negative for back pain. Skin: Negative for rash. Neurological: Negative for headaches, focal weakness or numbness.   ____________________________________________   PHYSICAL EXAM:  VITAL SIGNS: ED Triage Vitals  Enc Vitals Group     BP 08/09/17 0116 105/65     Pulse Rate 08/09/17 0116 (!) 102     Resp 08/09/17 0116 (!) 25     Temp 08/09/17 0116 99.1 F (37.3 C)     Temp Source 08/09/17 0116 Oral     SpO2 08/09/17 0116 99 %     Weight 08/09/17 0118 162 lb (73.5 kg)     Height 08/09/17 0118 6\' 1"  (1.854 m)     Head Circumference --      Peak Flow --      Pain Score 08/09/17 0118 4     Pain Loc --      Pain Edu? --      Excl. in [place]? --    Constitutional: Alert and oriented. Well appearing and in moderate distress. Eyes: Conjunctivae are normal. PERRL.  EOMI. Head: Atraumatic. Nose: No congestion/rhinnorhea. Mouth/Throat: Mucous membranes are moist.  Oropharynx non-erythematous. Cardiovascular:Tachycardia, regular rhythm. Grossly normal heart sounds.  Good peripheral circulation. Respiratory: Normal respiratory effort.  No retractions. Lungs CTAB. Gastrointestinal: Soft and nontender. No distention. Positive bowel sounds Musculoskeletal: No lower extremity tenderness nor edema.   Neurologic:  Normal speech and language.  Skin:  Skin is warm, dry and intact.  Psychiatric: Mood and affect are normal.   ____________________________________________   LABS (all labs ordered are listed, but only abnormal results are displayed)  Labs Reviewed  CBC WITH DIFFERENTIAL/PLATELET - Abnormal; Notable for the following components:      Result Value   MCV 76.6 (*)    MCH 24.9 (*)    RDW 15.0 (*)    Lymphs Abs 0.9 (*)    Monocytes  Absolute 1.5 (*)    All other components within normal limits  COMPREHENSIVE METABOLIC PANEL - Abnormal; Notable for the following components:   Sodium 127 (*)    Potassium 3.0 (*)    Chloride 89 (*)    Glucose, Bld 124 (*)    Calcium 8.3 (*)    Albumin 3.1 (*)    ALT 13 (*)    All other components within normal limits  URINALYSIS, COMPLETE (UACMP) WITH MICROSCOPIC - Abnormal; Notable for the following components:   Color, Urine YELLOW (*)    APPearance CLEAR (*)    Specific Gravity, Urine 1.003 (*)    Hgb urine dipstick SMALL (*)    All other components within normal limits  URINE DRUG SCREEN, QUALITATIVE ([facility] ONLY) - Abnormal; Notable for the following components:   Cocaine Metabolite,Ur [place] POSITIVE (*)    All other components within normal limits  CULTURE, BLOOD (ROUTINE X 2)  CULTURE, BLOOD (ROUTINE X 2)  LIPASE, BLOOD  TROPONIN I  ETHANOL  INFLUENZA PANEL BY PCR (TYPE A & B)   ____________________________________________  EKG  ED ECG REPORT I, Loney Hering, the attending physician, personally viewed and interpreted this ECG.   Date: 08/09/2017  EKG Time: 127  Rate: 97  Rhythm: normal sinus rhythm  Axis: normal  Intervals:none  ST&T Change: none  ____________________________________________  RADIOLOGY  ED MD interpretation:  CXR  Official radiology report(s): Dg Chest 2 View  Result Date: 08/09/2017 CLINICAL DATA:  Acute onset of vomiting and hemoptysis. EXAM: CHEST  2 VIEW COMPARISON:  Chest radiograph performed 04/17/2010 FINDINGS: A new right apical cavitary mass is noted, measuring perhaps 9-10 cm. Malignancy is a concern. Alternatively, atypical infection might have a similar appearance. No pleural effusion or pneumothorax is seen. The heart is normal in size. No acute osseous abnormalities are identified. IMPRESSION: New right apical cavitary mass, measuring perhaps 9-10 cm. Malignancy is a concern. Alternatively, atypical infection might have a  similar appearance. CT of the chest is recommended for further evaluation. These results were called by telephone at the time of interpretation on 08/09/2017 at 3:20 am to Dr. Charlesetta Ivory, who verbally acknowledged these results. Electronically Signed   By: Garald Balding M.D.   On: 08/09/2017 03:21   Ct Chest W Contrast  Result Date: 08/09/2017 CLINICAL DATA:  62 y/o M; emesis, hemoptysis, abdominal pain, black stools. EXAM: CT CHEST WITH CONTRAST TECHNIQUE: Multidetector CT imaging of the chest was performed during intravenous contrast administration. CONTRAST:  68mL ISOVUE-300 IOPAMIDOL (ISOVUE-300) INJECTION 61% COMPARISON:  08/09/2017 chest radiograph FINDINGS: Cardiovascular: Normal caliber thoracic aorta with mild calcific atherosclerosis. Moderate coronary artery calcification. Normal heart size. No  pericardial effusion. Mediastinum/Nodes: Right supraclavicular, right paratracheal, right hilar adenopathy. Right supraclavicular node measures 10 x 13 mm (series 2: Image 17), right lower paratracheal measures 25 x 22 mm (2:57), right lower hilar measures 13 x 18 mm (2:73). Additionally, there is a prominent left lower paratracheal node measuring 16 x 11 mm (2:57). Lungs/Pleura: Partial collapse of right lung with necrosis. Confluent consolidation/soft tissue is present extending into the right hilum and along course of right mainstem bronchus to the carina. Focus of consolidation right lower lobe superior segment. Bronchiolitis in the right lung base. No pleural effusion or pneumothorax. Upper Abdomen: No acute abnormality. Musculoskeletal: No chest wall abnormality. No acute or significant osseous findings. IMPRESSION: 1. Partial collapse and necrosis of the right upper lobe probably representing necrotic pneumonia. Tuberculosis should be excluded if there is a risk of exposure. An underlying hilar mass with postobstructive necrotic pneumonia is possible. 2. Right supraclavicular, paratracheal, and  hilar adenopathy. These results were called by telephone at the time of interpretation on 08/09/2017 at 5:05 am to Dr. Charlesetta Ivory , who verbally acknowledged these results. Electronically Signed   By: Kristine Garbe M.D.   On: 08/09/2017 05:08    ____________________________________________   PROCEDURES  Procedure(s) performed: None  Procedures  Critical Care performed: No  ____________________________________________   INITIAL IMPRESSION / ASSESSMENT AND PLAN / ED COURSE  As part of my medical decision making, I reviewed the following data within the electronic MEDICAL RECORD NUMBER Notes from prior ED visits and  Controlled Substance Database   This is a 24-year-old male who comes into the hospital today with fever nausea and vomiting.  The patient states that he has been having black appearing emesis.  The patient did do some illicit substances and is a drinker.  My differential diagnosis includes GI bleed, influenza, pneumonia, drug-induced chest pain, acute coronary syndrome.  I did check some blood work on the patient.  The patient CBC is negative but his CMP shows a sodium of 127.  The patient had a urine drug screen with that was positive for cocaine.  I will check a chest x-ray and I am awaiting the results of the influenza panel.  I will give the patient a shot of Toradol and he will be reassessed.  Also had a lipase troponin and ethanol there were all negative.  Clinical Course as of Aug 09 610  Tue Aug 09, 2017  0405 I did receive a phone call from radiology with a concern about a right apical cavitary mass.  They did recommend sending the patient for a CT of his chest with contrast looking for either atypical infection versus mass.  [AW]    Clinical Course User Index [AW] Loney Hering, MD   I received a subsequent call from the radiologist stating that he had noticed some partial collapse and necrosis of the right upper lobe representing necrotic  pneumonia.  He did state that tuberculosis should be excluded.  Given this necrotic pneumonia I will give the patient a dose of meropenem and azithromycin.  I will admit the patient to the hospitalist service for TB evaluation and further treatment.  The patient's vital signs remained unremarkable he will be admitted to the hospitalist service.  ____________________________________________   FINAL CLINICAL IMPRESSION(S) / ED DIAGNOSES  Final diagnoses:  Necrotizing pneumonia ([place])  Non-intractable vomiting with nausea, unspecified vomiting type     ED Discharge Orders    None       Note:  This document  was prepared using Systems analyst and may include unintentional dictation errors.    Loney Hering, MD 08/09/17 [phone]

## 2017-08-09 NOTE — Progress Notes (Signed)
Initial Nutrition Assessment  DOCUMENTATION CODES:   Severe malnutrition in context of chronic illness, Underweight  INTERVENTION:  Provide Ensure Enlive po TID, each supplement provides 350 kcal and 20 grams of protein.  Encouraged adequate intake of calories and protein from meals, snacks, and oral nutrition supplements.  Continue MVI daily, folic acid 1 mg daily, thiamine 100 mg daily.  Monitor magnesium, potassium, and phosphorus daily for at least 3 days, MD to replete as needed, as pt is at risk for refeeding syndrome given severe malnutrition.  NUTRITION DIAGNOSIS:   Severe Malnutrition related to chronic illness as evidenced by severe fat depletion, severe muscle depletion.  GOAL:   Patient will meet greater than or equal to 90% of their needs  MONITOR:   PO intake, Supplement acceptance, Labs, Weight trends, I & O's  REASON FOR ASSESSMENT:   Malnutrition Screening Tool, Consult Assessment of nutrition requirement/status  ASSESSMENT:   62 year old male with PMHx of anxiety, depression, EtOH abuse, cocaine abuse, and a self-reported hx of TB s/p treatment for 1 year (not in chart) who presented with a few day hx of vomiting and abdominal pain, weight loss, night sweats, and hemoptysis found to have collapse and necrosis of right upper lobe on CT chest admitted to rule out TB and HIV.   Met with patient at bedside. He reports he has had a poor appetite for 2-3 days PTA and was not able to keep any food down. Before that he reports he typically has a good appetite and eats 3-4 good meals daily. He reports eating a good breakfast today and denies any post-prandial abdominal pain or nausea. Denies any difficulty chewing or swallowing.  Patient reports his UBW is 170-180 lbs. He is reporting he has lost about 40 lbs (23.5% body weight) sometime over the past year. No weight history in chart to verify.  Medications reviewed and include: Colace, folic acid 1 mg daily, Ativan,  MVI daily, potassium chloride 40 mEq Q4hrs PO, thiamine 100 mg daily, NS @ 125 mL/hr, Zosyn, vancomycin.  Labs reviewed: CBG 115, Sodium 127, Potassium 3, Chloride 89.   Discussed with RN.  NUTRITION - FOCUSED PHYSICAL EXAM:    Most Recent Value  Orbital Region  Severe depletion  Upper Arm Region  Severe depletion  Thoracic and Lumbar Region  Severe depletion  Buccal Region  Severe depletion  Temple Region  Severe depletion  Clavicle Bone Region  Severe depletion  Clavicle and Acromion Bone Region  Severe depletion  Scapular Bone Region  Severe depletion  Dorsal Hand  Moderate depletion  Patellar Region  Moderate depletion  Anterior Thigh Region  Moderate depletion  Posterior Calf Region  Moderate depletion  Edema (RD Assessment)  None  Hair  Reviewed  Eyes  Reviewed  Mouth  Reviewed  Skin  Reviewed  Nails  Reviewed     Diet Order:  Diet regular Room service appropriate? Yes; Fluid consistency: Thin  EDUCATION NEEDS:   No education needs have been identified at this time  Skin:  Skin Assessment: Reviewed RN Assessment  Last BM:  PTA (08/08/2017 per chart)  Height:   Ht Readings from Last 1 Encounters:  08/09/17 6' 1"  (1.854 m)    Weight:   Wt Readings from Last 1 Encounters:  08/09/17 133 lb 6.1 oz (60.5 kg)    Ideal Body Weight:  83.6 kg  BMI:  Body mass index is 17.6 kg/m.  Estimated Nutritional Needs:   Kcal:  1770-2060 (MSJ x 1.2-1.4)  Protein:  90-100 grams (1.5-1.7 grams/kg)  Fluid:  1.8-2.1 L/day (30-35 mL/kg)  Willey Blade, MS, RD, LDN Office: [phone] Pager: [phone] After Hours/Weekend Pager: [phone]

## 2017-08-09 NOTE — Progress Notes (Signed)
[facility] [place] NAME: [place] Chapman    MR#:  [id number]  DATE OF BIRTH:  1955/11/11  SUBJECTIVE:  CHIEF COMPLAINT:   Chief Complaint  Patient presents with  . Emesis  . Abdominal Pain   -Came in with nausea vomiting and difficulty breathing. Found to have necrotizing pneumonia. -Complains of some shortness of breath and cough.  REVIEW OF SYSTEMS:  Review of Systems  Constitutional: Positive for fever and malaise/fatigue. Negative for chills.  HENT: Negative for congestion, ear discharge, hearing loss, nosebleeds and sinus pain.   Eyes: Negative for blurred vision and double vision.  Respiratory: Positive for cough and sputum production. Negative for shortness of breath and wheezing.   Cardiovascular: Negative for chest pain and palpitations.  Gastrointestinal: Negative for abdominal pain, constipation, diarrhea, nausea and vomiting.  Genitourinary: Negative for dysuria.  Musculoskeletal: Negative for myalgias.  Neurological: Negative for dizziness, speech change, focal weakness, seizures and headaches.  Psychiatric/Behavioral: Negative for depression.    DRUG ALLERGIES:  No Known Allergies  VITALS:  Blood pressure 92/68, pulse 73, temperature 98.1 F (36.7 C), temperature source Oral, resp. rate (!) 22, height 6\' 1"  (1.854 m), weight 60.5 kg (133 lb 6.1 oz), SpO2 99 %.  PHYSICAL EXAMINATION:  Physical Exam  GENERAL:  63 y.o.-year-old ill nourished patient lying in the bed with no acute distress.  EYES: Pupils equal, round, reactive to light and accommodation. No scleral icterus. Extraocular muscles intact.  HEENT: Head atraumatic, normocephalic. Oropharynx and nasopharynx clear.  NECK:  Supple, no jugular venous distention. No thyroid enlargement, no tenderness.  LUNGS: Normal breath sounds bilaterally, upper airway coarse rhonchi, no wheezing, rales or crepitation. No use of accessory muscles of respiration.    CARDIOVASCULAR: S1, S2 normal. No murmurs, rubs, or gallops.  ABDOMEN: Soft, nontender, nondistended. Bowel sounds present. No organomegaly or mass.  EXTREMITIES: No pedal edema, cyanosis, or clubbing.  NEUROLOGIC: Cranial nerves II through XII are intact. Muscle strength 5/5 in all extremities. Sensation intact. Gait not checked.  PSYCHIATRIC: The patient is alert and oriented x 3.  SKIN: No obvious rash, lesion, or ulcer.    LABORATORY PANEL:   CBC Recent Labs  Lab 08/09/17 0116  WBC 8.3  HGB 13.4  HCT 41.2  PLT 218   ------------------------------------------------------------------------------------------------------------------  Chemistries  Recent Labs  Lab 08/09/17 0116  NA 127*  K 3.0*  CL 89*  CO2 28  GLUCOSE 124*  BUN 7  CREATININE 0.72  CALCIUM 8.3*  AST 26  ALT 13*  ALKPHOS 65  BILITOT 0.8   ------------------------------------------------------------------------------------------------------------------  Cardiac Enzymes Recent Labs  Lab 08/09/17 0116  TROPONINI <0.03   ------------------------------------------------------------------------------------------------------------------  RADIOLOGY:  Dg Chest 2 View  Result Date: 08/09/2017 CLINICAL DATA:  Acute onset of vomiting and hemoptysis. EXAM: CHEST  2 VIEW COMPARISON:  Chest radiograph performed 04/17/2010 FINDINGS: A new right apical cavitary mass is noted, measuring perhaps 9-10 cm. Malignancy is a concern. Alternatively, atypical infection might have a similar appearance. No pleural effusion or pneumothorax is seen. The heart is normal in size. No acute osseous abnormalities are identified. IMPRESSION: New right apical cavitary mass, measuring perhaps 9-10 cm. Malignancy is a concern. Alternatively, atypical infection might have a similar appearance. CT of the chest is recommended for further evaluation. These results were called by telephone at the time of interpretation on 08/09/2017 at 3:20 am  to Dr. Charlesetta Ivory, who verbally acknowledged these results. Electronically Signed   By: Jacqulynn Cadet  Chang M.D.   On: 08/09/2017 03:21   Ct Chest W Contrast  Result Date: 08/09/2017 CLINICAL DATA:  62 y/o M; emesis, hemoptysis, abdominal pain, black stools. EXAM: CT CHEST WITH CONTRAST TECHNIQUE: Multidetector CT imaging of the chest was performed during intravenous contrast administration. CONTRAST:  35mL ISOVUE-300 IOPAMIDOL (ISOVUE-300) INJECTION 61% COMPARISON:  08/09/2017 chest radiograph FINDINGS: Cardiovascular: Normal caliber thoracic aorta with mild calcific atherosclerosis. Moderate coronary artery calcification. Normal heart size. No pericardial effusion. Mediastinum/Nodes: Right supraclavicular, right paratracheal, right hilar adenopathy. Right supraclavicular node measures 10 x 13 mm (series 2: Image 17), right lower paratracheal measures 25 x 22 mm (2:57), right lower hilar measures 13 x 18 mm (2:73). Additionally, there is a prominent left lower paratracheal node measuring 16 x 11 mm (2:57). Lungs/Pleura: Partial collapse of right lung with necrosis. Confluent consolidation/soft tissue is present extending into the right hilum and along course of right mainstem bronchus to the carina. Focus of consolidation right lower lobe superior segment. Bronchiolitis in the right lung base. No pleural effusion or pneumothorax. Upper Abdomen: No acute abnormality. Musculoskeletal: No chest wall abnormality. No acute or significant osseous findings. IMPRESSION: 1. Partial collapse and necrosis of the right upper lobe probably representing necrotic pneumonia. Tuberculosis should be excluded if there is a risk of exposure. An underlying hilar mass with postobstructive necrotic pneumonia is possible. 2. Right supraclavicular, paratracheal, and hilar adenopathy. These results were called by telephone at the time of interpretation on 08/09/2017 at 5:05 am to Dr. Charlesetta Ivory , who verbally acknowledged these  results. Electronically Signed   By: Kristine Garbe M.D.   On: 08/09/2017 05:08    EKG:   Orders placed or performed during the hospital encounter of 08/09/17  . ED EKG  . ED EKG  . EKG 12-Lead  . EKG 12-Lead    ASSESSMENT AND PLAN:   62 year old male with past medical history significant for alcohol abuse, anxiety and depression presents to hospital secondary to ongoing nausea and vomiting associated with night sweats and weight loss.  1. Right upper lobe necrotizing pneumonia-could be aspiration pneumonia since he is an alcoholic, cannot rule out underlying tuberculosis. -Patient has had history of tuberculosis that was treated several years ago. Also has history of incarceration - TB quantiferon test, AFB smears pending - Infectious disease consult. -For now started on vancomycin and Zosyn -Oxygen support as needed.  2. Hyponatremia and hypokalemia-will be replaced appropriately with fluids  3. Severe malnutrition-appreciate dietitian input. Electrolytes will be managed  4. Alcohol abuse-monitor for any withdrawals. On Ativan as needed and -also polysubstance abuse. Counseled on admission  5. DVT prophylaxis-since patient came in with hemoptysis, monitor at this time. Teds and SCDs on the    All the records are reviewed and case discussed with Care Management/Social Workerr. Management plans discussed with the patient, family and they are in agreement.  CODE STATUS: Full Code  TOTAL TIME TAKING CARE OF THIS PATIENT: 38 minutes.   POSSIBLE D/C IN 3-4 DAYS, DEPENDING ON CLINICAL CONDITION.   Gladstone Lighter M.D on 08/09/2017 at 1:52 PM  Between 7am to 6pm - Pager - [phone]  After 6pm go to [email or web address] - password [email or web address] [facility]  Office  [phone]  CC: Primary care physician; Patient, [name]

## 2017-08-09 NOTE — ED Notes (Signed)
Patient moved to new negative pressure room

## 2017-08-10 LAB — PHOSPHORUS: PHOSPHORUS: 1.8 mg/dL — AB (ref 2.5–4.6)

## 2017-08-10 LAB — BASIC METABOLIC PANEL
Anion gap: 7 (ref 5–15)
BUN: 10 mg/dL (ref 6–20)
CALCIUM: 8.3 mg/dL — AB (ref 8.9–10.3)
CO2: 23 mmol/L (ref 22–32)
Chloride: 103 mmol/L (ref 101–111)
Creatinine, Ser: 0.68 mg/dL (ref 0.61–1.24)
GFR calc Af Amer: >60 mL/min (ref >60)
GLUCOSE: 115 mg/dL — AB (ref 65–99)
Potassium: 3.6 mmol/L (ref 3.5–5.1)
Sodium: 133 mmol/L — ABNORMAL LOW (ref 135–145)

## 2017-08-10 LAB — MAGNESIUM: MAGNESIUM: 2.1 mg/dL (ref 1.7–2.4)

## 2017-08-10 LAB — HIV ANTIBODY (ROUTINE TESTING W REFLEX): HIV Screen 4th Generation wRfx: NONREACTIVE

## 2017-08-10 MED ORDER — POTASSIUM PHOSPHATES 15 MMOLE/5ML IV SOLN
30.0000 mmol | Freq: Once | INTRAVENOUS | Status: AC
  Administered 2017-08-10: 30 mmol via INTRAVENOUS
  Filled 2017-08-10: qty 10

## 2017-08-10 MED ORDER — SODIUM CHLORIDE 0.9 % IV SOLN
20.0000 mmol | Freq: Once | INTRAVENOUS | Status: DC
  Filled 2017-08-10: qty 20

## 2017-08-10 MED ORDER — K PHOS MONO-SOD PHOS DI & MONO 155-852-130 MG PO TABS
500.0000 mg | ORAL_TABLET | Freq: Once | ORAL | Status: DC
  Filled 2017-08-10: qty 2

## 2017-08-10 NOTE — Progress Notes (Signed)
Pt is alert and oriented x4 . Cooperative. CIWA score=0. Up in room to use bathroom.Steady on feet. No ShOB noted. States he feels more tired than normal. But otherwise much better. Lungs are diminished. Congested strong cough of clear sputum.  New orders for repeat TB acidfast smear and cx. Pt will call when specimen collected. Appetite good for breakfast and supper. UOP adequate.

## 2017-08-10 NOTE — Progress Notes (Signed)
[facility] [place] NAME: [place] Chapman    MR#:  [id number]  DATE OF BIRTH:  Nov 05, 1955  SUBJECTIVE:  CHIEF COMPLAINT:   Chief Complaint  Patient presents with  . Emesis  . Abdominal Pain   -Feeling much better today.  Cough is improving.  Appetite is improving  REVIEW OF SYSTEMS:  Review of Systems  Constitutional: Positive for malaise/fatigue. Negative for chills and fever.  HENT: Negative for congestion, ear discharge, hearing loss, nosebleeds and sinus pain.   Eyes: Negative for blurred vision and double vision.  Respiratory: Positive for cough and sputum production. Negative for shortness of breath and wheezing.   Cardiovascular: Negative for chest pain and palpitations.  Gastrointestinal: Negative for abdominal pain, constipation, diarrhea, nausea and vomiting.  Genitourinary: Negative for dysuria.  Musculoskeletal: Negative for myalgias.  Neurological: Negative for dizziness, speech change, focal weakness, seizures and headaches.  Psychiatric/Behavioral: Negative for depression.    DRUG ALLERGIES:  No Known Allergies  VITALS:  Blood pressure 104/68, pulse 65, temperature 98.4 F (36.9 C), temperature source Oral, resp. rate (!) 27, height 6\' 1"  (1.854 m), weight 65.2 kg (143 lb 11.8 oz), SpO2 99 %.  PHYSICAL EXAMINATION:  Physical Exam  GENERAL:  62 y.o.-year-old ill nourished patient lying in the bed with no acute distress.  EYES: Pupils equal, round, reactive to light and accommodation. No scleral icterus. Extraocular muscles intact.  HEENT: Head atraumatic, normocephalic. Oropharynx and nasopharynx clear.  NECK:  Supple, no jugular venous distention. No thyroid enlargement, no tenderness.  LUNGS: Normal breath sounds bilaterally, decreased upper lobe breath sounds, or tumor, no wheezing, rales or crepitation. No use of accessory muscles of respiration.  CARDIOVASCULAR: S1, S2 normal. No murmurs, rubs, or gallops.    ABDOMEN: Soft, nontender, nondistended. Bowel sounds present. No organomegaly or mass.  EXTREMITIES: No pedal edema, cyanosis, or clubbing.  NEUROLOGIC: Cranial nerves II through XII are intact. Muscle strength 5/5 in all extremities. Sensation intact. Gait not checked.  PSYCHIATRIC: The patient is alert and oriented x 3.  SKIN: No obvious rash, lesion, or ulcer.    LABORATORY PANEL:   CBC Recent Labs  Lab 08/09/17 0116  WBC 8.3  HGB 13.4  HCT 41.2  PLT 218   ------------------------------------------------------------------------------------------------------------------  Chemistries  Recent Labs  Lab 08/09/17 0116 08/10/17 0537  NA 127* 133*  K 3.0* 3.6  CL 89* 103  CO2 28 23  GLUCOSE 124* 115*  BUN 7 10  CREATININE 0.72 0.68  CALCIUM 8.3* 8.3*  MG  --  2.1  AST 26  --   ALT 13*  --   ALKPHOS 65  --   BILITOT 0.8  --    ------------------------------------------------------------------------------------------------------------------  Cardiac Enzymes Recent Labs  Lab 08/09/17 0116  TROPONINI <0.03   ------------------------------------------------------------------------------------------------------------------  RADIOLOGY:  Dg Chest 2 View  Result Date: 08/09/2017 CLINICAL DATA:  Acute onset of vomiting and hemoptysis. EXAM: CHEST  2 VIEW COMPARISON:  Chest radiograph performed 04/17/2010 FINDINGS: A new right apical cavitary mass is noted, measuring perhaps 9-10 cm. Malignancy is a concern. Alternatively, atypical infection might have a similar appearance. No pleural effusion or pneumothorax is seen. The heart is normal in size. No acute osseous abnormalities are identified. IMPRESSION: New right apical cavitary mass, measuring perhaps 9-10 cm. Malignancy is a concern. Alternatively, atypical infection might have a similar appearance. CT of the chest is recommended for further evaluation. These results were called by telephone at the time of interpretation  on  08/09/2017 at 3:20 am to Dr. Charlesetta Ivory, who verbally acknowledged these results. Electronically Signed   By: Garald Balding M.D.   On: 08/09/2017 03:21   Ct Chest W Contrast  Result Date: 08/09/2017 CLINICAL DATA:  62 y/o M; emesis, hemoptysis, abdominal pain, black stools. EXAM: CT CHEST WITH CONTRAST TECHNIQUE: Multidetector CT imaging of the chest was performed during intravenous contrast administration. CONTRAST:  16mL ISOVUE-300 IOPAMIDOL (ISOVUE-300) INJECTION 61% COMPARISON:  08/09/2017 chest radiograph FINDINGS: Cardiovascular: Normal caliber thoracic aorta with mild calcific atherosclerosis. Moderate coronary artery calcification. Normal heart size. No pericardial effusion. Mediastinum/Nodes: Right supraclavicular, right paratracheal, right hilar adenopathy. Right supraclavicular node measures 10 x 13 mm (series 2: Image 17), right lower paratracheal measures 25 x 22 mm (2:57), right lower hilar measures 13 x 18 mm (2:73). Additionally, there is a prominent left lower paratracheal node measuring 16 x 11 mm (2:57). Lungs/Pleura: Partial collapse of right lung with necrosis. Confluent consolidation/soft tissue is present extending into the right hilum and along course of right mainstem bronchus to the carina. Focus of consolidation right lower lobe superior segment. Bronchiolitis in the right lung base. No pleural effusion or pneumothorax. Upper Abdomen: No acute abnormality. Musculoskeletal: No chest wall abnormality. No acute or significant osseous findings. IMPRESSION: 1. Partial collapse and necrosis of the right upper lobe probably representing necrotic pneumonia. Tuberculosis should be excluded if there is a risk of exposure. An underlying hilar mass with postobstructive necrotic pneumonia is possible. 2. Right supraclavicular, paratracheal, and hilar adenopathy. These results were called by telephone at the time of interpretation on 08/09/2017 at 5:05 am to Dr. Charlesetta Ivory , who verbally  acknowledged these results. Electronically Signed   By: Kristine Garbe M.D.   On: 08/09/2017 05:08    EKG:   Orders placed or performed during the hospital encounter of 08/09/17  . ED EKG  . ED EKG  . EKG 12-Lead  . EKG 12-Lead    ASSESSMENT AND PLAN:   62 year old male with past medical history significant for alcohol abuse, anxiety and depression presents to hospital secondary to ongoing nausea and vomiting associated with night sweats and weight loss.  1. Right upper lobe necrotizing pneumonia-could be aspiration pneumonia since he is an alcoholic, cannot rule out underlying tuberculosis or tumor. -Patient has had history of tuberculosis that was treated in 2007.  Also has history of incarceration -Sputum culture sent, AFB smears pending - Infectious disease consult is appreciated -MRSA PCR is negative, discontinued vancomycin.  Currently only on Zosyn -Continue IV antibiotics for now.  Will need repeat CT follow-up as outpatient to rule out underlying malignancy -Oxygen support as needed.  2. Hyponatremia and hypokalemia-replaced appropriately with fluids  3. Severe malnutrition-appreciate dietitian input. Electrolytes will be managed  4. Alcohol abuse-monitor for any withdrawals. On Ativan as needed and -also polysubstance abuse. Counseled on admission  5. DVT prophylaxis-since patient came in with hemoptysis, monitor at this time. Teds and SCDs on the    All the records are reviewed and case discussed with Care Management/Social Workerr. Management plans discussed with the patient, family and they are in agreement.  CODE STATUS: Full Code  TOTAL TIME TAKING CARE OF THIS PATIENT: 36 minutes.   POSSIBLE D/C IN 3-4 DAYS, DEPENDING ON CLINICAL CONDITION.   Gladstone Lighter M.D on 08/10/2017 at 9:03 AM  Between 7am to 6pm - Pager - [phone]  After 6pm go to [email or web address] - Proofreader  [employer]   [phone]  CC: Primary care physician; Patient, [name]

## 2017-08-10 NOTE — Care Management Obs Status (Signed)
[place] NOTIFICATION   Patient Details  Name: CHAWN SPRAGGINS MRN: [id number] Date of Birth: 16-May-1956   Medicare Observation Status Notification Given:  Yes  Patient struggled with [facility] mouse on signature  Marshell Garfinkel, RN 08/10/2017, 9:41 AM

## 2017-08-10 NOTE — Progress Notes (Signed)
[facility] PROGRESS NOTE Date of Admission:  08/09/2017     ID: Wayne Chapman is a 62 y.o. male with  Severe PNA Active Problems:   Hemoptysis   Protein-calorie malnutrition, severe   Subjective: No fevers, cough a little better.   ROS  Eleven systems are reviewed and negative except per hpi  Medications:  Antibiotics Given (last 72 hours)    Date/Time Action Medication Dose Rate   08/09/17 0723 New Bag/Given   meropenem (MERREM) 1 g in sodium chloride 0.9 % 100 mL IVPB 1 g 200 mL/hr   08/09/17 0724 New Bag/Given   azithromycin (ZITHROMAX) 500 mg in sodium chloride 0.9 % 250 mL IVPB 500 mg 250 mL/hr   08/09/17 0912 New Bag/Given   vancomycin (VANCOCIN) IVPB 1000 mg/200 mL premix 1,000 mg 200 mL/hr   08/09/17 0913 New Bag/Given   piperacillin-tazobactam (ZOSYN) IVPB 3.375 g 3.375 g 12.5 mL/hr   08/09/17 1606 New Bag/Given   piperacillin-tazobactam (ZOSYN) IVPB 3.375 g 3.375 g 12.5 mL/hr   08/09/17 1606 New Bag/Given   vancomycin (VANCOCIN) IVPB 1000 mg/200 mL premix 1,000 mg 200 mL/hr   08/10/17 0153 New Bag/Given   piperacillin-tazobactam (ZOSYN) IVPB 3.375 g 3.375 g 12.5 mL/hr   08/10/17 0854 New Bag/Given   piperacillin-tazobactam (ZOSYN) IVPB 3.375 g 3.375 g 12.5 mL/hr     . docusate sodium  100 mg Oral BID  . feeding supplement (ENSURE ENLIVE)  237 mL Oral TID BM  . folic acid  1 mg Oral Daily  . Influenza vac split quadrivalent PF  0.5 mL Intramuscular Tomorrow-1000  . LORazepam  0-4 mg Intravenous Q6H   Followed by  . [START ON 08/11/2017] LORazepam  0-4 mg Intravenous Q12H  . multivitamin with minerals  1 tablet Oral Daily  . thiamine  100 mg Oral Daily    Objective: Vital signs in last 24 hours: Temp:  [98 F (36.7 C)-98.4 F (36.9 C)] 98 F (36.7 C) (02/13 1200) Pulse Rate:  [57-80] 57 (02/13 1300) Resp:  [12-30] 21 (02/13 1300) BP: (80-119)/(56-72) 105/66 (02/13 1300) SpO2:  [95 %-100 %] 98 % (02/13 1300) Weight:  [65.2 kg (143  lb 11.8 oz)] 65.2 kg (143 lb 11.8 oz) (02/13 0425) Constitutional: He is oriented to person, place, and time. Thin, nad HENT: anicteric, muddy sclera Mouth/Throat: Oropharynx is clear and moist. Edentulous, has dentures Cardiovascular: Normal rate, regular rhythm and normal heart sounds. Exam reveals no gallop and no friction rub.  No murmur heard.  Pulmonary/Chest: bil rhonhci Abdominal: Soft. Bowel sounds are normal. He exhibits no distension. There is no tenderness.  Lymphadenopathy:  He has no cervical adenopathy.  Neurological: He is alert and oriented to person, place, and time.  Skin: Skin is warm and dry. No rash noted. No erythema.  Psychiatric: He has a normal mood and affect. His behavior is normal.     Lab Results Recent Labs    08/09/17 0116 08/10/17 0537  WBC 8.3  --   HGB 13.4  --   HCT 41.2  --   NA 127* 133*  K 3.0* 3.6  CL 89* 103  CO2 28 23  BUN 7 10  CREATININE 0.72 0.68    Microbiology: Results for orders placed or performed during the hospital encounter of 08/09/17  Blood culture (routine x 2)     Status: None (Preliminary result)   Collection Time: 08/09/17  6:04 AM  Result Value Ref Range Status   Specimen Description BLOOD LEFT  FA  Final   Special Requests   Final    BOTTLES DRAWN AEROBIC AND ANAEROBIC Blood Culture results may not be optimal due to an excessive volume of blood received in culture bottles   Culture   Final    NO GROWTH < 24 HOURS Performed at [facility], [address], [place], [place] [address]    Report Status PENDING  Incomplete  Blood culture (routine x 2)     Status: None (Preliminary result)   Collection Time: 08/09/17  6:04 AM  Result Value Ref Range Status   Specimen Description BLOOD LEFT AC  Final   Special Requests   Final    BOTTLES DRAWN AEROBIC AND ANAEROBIC Blood Culture results may not be optimal due to an excessive volume of blood received in culture bottles   Culture   Final    NO GROWTH < 24  HOURS Performed at [facility], [address], [place], [place] [address]    Report Status PENDING  Incomplete  MRSA PCR Screening     Status: None   Collection Time: 08/09/17  8:42 AM  Result Value Ref Range Status   MRSA by PCR NEGATIVE NEGATIVE Final    Comment:        The GeneXpert MRSA Assay (FDA approved for NASAL specimens only), is one component of a comprehensive MRSA colonization surveillance program. It is not intended to diagnose MRSA infection nor to guide or monitor treatment for MRSA infections. Performed at [facility], [address] [place], [place] [address]   Culture, expectorated sputum-assessment     Status: None   Collection Time: 08/09/17 11:44 AM  Result Value Ref Range Status   Specimen Description Expect. Sput  Final   Special Requests Normal  Final   Sputum evaluation   Final    THIS SPECIMEN IS ACCEPTABLE FOR SPUTUM CULTURE Performed at [facility], [address] [place], [place] [address]    Report Status 08/09/2017 FINAL  Final  Culture, respiratory (NON-Expectorated)     Status: None (Preliminary result)   Collection Time: 08/09/17 11:44 AM  Result Value Ref Range Status   Specimen Description   Final    Expect. Sput Performed at [facility], [address] [place], [place] [address]    Special Requests   Final    Normal Reflexed from [id number] Performed at [facility], [address] [place], [place] [address]    Gram Stain   Final    MODERATE WBC PRESENT, PREDOMINANTLY PMN NO SQUAMOUS EPITHELIAL CELLS SEEN FEW GRAM POSITIVE COCCI IN CLUSTERS FEW GRAM POSITIVE RODS    Culture   Final    TOO YOUNG TO READ Performed at [facility], [place] [address], [place], [place] [address]    Report Status PENDING  Incomplete    Studies/Results: Dg Chest 2 View  Result Date: 08/09/2017 CLINICAL DATA:  Acute onset of vomiting and hemoptysis. EXAM: CHEST  2 VIEW COMPARISON:  Chest  radiograph performed 04/17/2010 FINDINGS: A new right apical cavitary mass is noted, measuring perhaps 9-10 cm. Malignancy is a concern. Alternatively, atypical infection might have a similar appearance. No pleural effusion or pneumothorax is seen. The heart is normal in size. No acute osseous abnormalities are identified. IMPRESSION: New right apical cavitary mass, measuring perhaps 9-10 cm. Malignancy is a concern. Alternatively, atypical infection might have a similar appearance. CT of the chest is recommended for further evaluation. These results were called by telephone  at the time of interpretation on 08/09/2017 at 3:20 am to Dr. Charlesetta Ivory, who verbally acknowledged these results. Electronically Signed   By: Garald Balding M.D.   On: 08/09/2017 03:21   Ct Chest W Contrast  Result Date: 08/09/2017 CLINICAL DATA:  62 y/o M; emesis, hemoptysis, abdominal pain, black stools. EXAM: CT CHEST WITH CONTRAST TECHNIQUE: Multidetector CT imaging of the chest was performed during intravenous contrast administration. CONTRAST:  23mL ISOVUE-300 IOPAMIDOL (ISOVUE-300) INJECTION 61% COMPARISON:  08/09/2017 chest radiograph FINDINGS: Cardiovascular: Normal caliber thoracic aorta with mild calcific atherosclerosis. Moderate coronary artery calcification. Normal heart size. No pericardial effusion. Mediastinum/Nodes: Right supraclavicular, right paratracheal, right hilar adenopathy. Right supraclavicular node measures 10 x 13 mm (series 2: Image 17), right lower paratracheal measures 25 x 22 mm (2:57), right lower hilar measures 13 x 18 mm (2:73). Additionally, there is a prominent left lower paratracheal node measuring 16 x 11 mm (2:57). Lungs/Pleura: Partial collapse of right lung with necrosis. Confluent consolidation/soft tissue is present extending into the right hilum and along course of right mainstem bronchus to the carina. Focus of consolidation right lower lobe superior segment. Bronchiolitis in the right lung  base. No pleural effusion or pneumothorax. Upper Abdomen: No acute abnormality. Musculoskeletal: No chest wall abnormality. No acute or significant osseous findings. IMPRESSION: 1. Partial collapse and necrosis of the right upper lobe probably representing necrotic pneumonia. Tuberculosis should be excluded if there is a risk of exposure. An underlying hilar mass with postobstructive necrotic pneumonia is possible. 2. Right supraclavicular, paratracheal, and hilar adenopathy. These results were called by telephone at the time of interpretation on 08/09/2017 at 5:05 am to Dr. Charlesetta Ivory , who verbally acknowledged these results. Electronically Signed   By: Kristine Garbe M.D.   On: 08/09/2017 05:08    Assessment/Plan: Wayne Chapman is a 62 y.o. male with hx of tobacco, etoh (3-4 40 oz beer daily) as well as cocaine use admitted with RUL necrotizing PNA.  He has hx Latent TB treated in 2007. Also has hx of incarceration. HIV test pending.  Flu PCR negative.  He likely has aspiration PNA given substance use but some concern for malignancy and post obstructive PNA esp given his LAN noted on CT but could be reactive. 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  The sputum at bedside is thick and brown in character and is a large amount.  Suspect mixed, anaerobic infection  Will need wu for TB, coverage for aspiration PNA, and eventual fu imaging. Will likely need prolonged oral therapy. Hopefully will have growth on culture to guide therapy.    Recommendations Cont zosyn Await cultures. Needs one more AFB sent     Thank you very much for the consult. Will follow with you.  Leonel Ramsay   08/10/2017, 3:31 PM

## 2017-08-10 NOTE — Care Management (Signed)
RNCM met with patient to explain [place] letter. He is from home with his girlfriend. He states he is not having problems obtaining his medications. I explained the CM referral and he was not sure what that was about.  He is independent from home. No RNCM needs.

## 2017-08-11 DIAGNOSIS — Z7151 Drug abuse counseling and surveillance of drug abuser: Secondary | ICD-10-CM | POA: Diagnosis not present

## 2017-08-11 DIAGNOSIS — X58XXXA Exposure to other specified factors, initial encounter: Secondary | ICD-10-CM | POA: Diagnosis not present

## 2017-08-11 DIAGNOSIS — Z8611 Personal history of tuberculosis: Secondary | ICD-10-CM | POA: Diagnosis not present

## 2017-08-11 DIAGNOSIS — Z8659 Personal history of other mental and behavioral disorders: Secondary | ICD-10-CM | POA: Diagnosis not present

## 2017-08-11 DIAGNOSIS — E876 Hypokalemia: Secondary | ICD-10-CM | POA: Diagnosis present

## 2017-08-11 DIAGNOSIS — E43 Unspecified severe protein-calorie malnutrition: Secondary | ICD-10-CM | POA: Diagnosis present

## 2017-08-11 DIAGNOSIS — J690 Pneumonitis due to inhalation of food and vomit: Secondary | ICD-10-CM | POA: Diagnosis present

## 2017-08-11 DIAGNOSIS — J850 Gangrene and necrosis of lung: Secondary | ICD-10-CM | POA: Diagnosis present

## 2017-08-11 DIAGNOSIS — R402413 Glasgow coma scale score 13-15, at hospital admission: Secondary | ICD-10-CM | POA: Diagnosis present

## 2017-08-11 DIAGNOSIS — F1020 Alcohol dependence, uncomplicated: Secondary | ICD-10-CM | POA: Diagnosis present

## 2017-08-11 DIAGNOSIS — B9689 Other specified bacterial agents as the cause of diseases classified elsewhere: Secondary | ICD-10-CM | POA: Diagnosis present

## 2017-08-11 DIAGNOSIS — Z23 Encounter for immunization: Secondary | ICD-10-CM | POA: Diagnosis present

## 2017-08-11 DIAGNOSIS — J984 Other disorders of lung: Secondary | ICD-10-CM | POA: Diagnosis present

## 2017-08-11 DIAGNOSIS — E871 Hypo-osmolality and hyponatremia: Secondary | ICD-10-CM | POA: Diagnosis present

## 2017-08-11 DIAGNOSIS — T17818A Gastric contents in other parts of respiratory tract causing other injury, initial encounter: Secondary | ICD-10-CM | POA: Diagnosis present

## 2017-08-11 DIAGNOSIS — R042 Hemoptysis: Secondary | ICD-10-CM | POA: Diagnosis present

## 2017-08-11 DIAGNOSIS — F17210 Nicotine dependence, cigarettes, uncomplicated: Secondary | ICD-10-CM | POA: Diagnosis present

## 2017-08-11 DIAGNOSIS — Z681 Body mass index (BMI) 19 or less, adult: Secondary | ICD-10-CM | POA: Diagnosis not present

## 2017-08-11 DIAGNOSIS — R61 Generalized hyperhidrosis: Secondary | ICD-10-CM | POA: Diagnosis present

## 2017-08-11 DIAGNOSIS — F1410 Cocaine abuse, uncomplicated: Secondary | ICD-10-CM | POA: Diagnosis present

## 2017-08-11 LAB — ACID FAST SMEAR (AFB): ACID FAST SMEAR - AFSCU2: NEGATIVE

## 2017-08-11 LAB — BASIC METABOLIC PANEL
Anion gap: 11 (ref 5–15)
BUN: 7 mg/dL (ref 6–20)
CO2: 19 mmol/L — ABNORMAL LOW (ref 22–32)
Calcium: 8.9 mg/dL (ref 8.9–10.3)
Chloride: 105 mmol/L (ref 101–111)
Creatinine, Ser: 0.73 mg/dL (ref 0.61–1.24)
GFR calc Af Amer: >60 mL/min (ref >60)
GLUCOSE: 93 mg/dL (ref 65–99)
POTASSIUM: 4.5 mmol/L (ref 3.5–5.1)
Sodium: 135 mmol/L (ref 135–145)

## 2017-08-11 LAB — MAGNESIUM: Magnesium: 2.1 mg/dL (ref 1.7–2.4)

## 2017-08-11 LAB — PHOSPHORUS: Phosphorus: 3.3 mg/dL (ref 2.5–4.6)

## 2017-08-11 LAB — ACID FAST SMEAR (AFB, MYCOBACTERIA)

## 2017-08-11 MED ORDER — ENOXAPARIN SODIUM 40 MG/0.4ML ~~LOC~~ SOLN
40.0000 mg | SUBCUTANEOUS | Status: DC
  Administered 2017-08-11: 40 mg via SUBCUTANEOUS
  Filled 2017-08-11: qty 0.4

## 2017-08-11 NOTE — Progress Notes (Signed)
[facility] [place] NAME: Wayne Chapman    MR#:  [id number]  DATE OF BIRTH:  [birth year]  SUBJECTIVE:  CHIEF COMPLAINT:   Chief Complaint  Patient presents with  . Emesis  . Abdominal Pain   Feels much better today. Cough and breathing are improving. No fevers. AFB 1 is negative.  REVIEW OF SYSTEMS:  Review of Systems  Constitutional: Negative for chills, fever and malaise/fatigue.  HENT: Negative for congestion, ear discharge, hearing loss, nosebleeds and sinus pain.   Eyes: Negative for blurred vision and double vision.  Respiratory: Positive for cough and sputum production. Negative for shortness of breath and wheezing.   Cardiovascular: Negative for chest pain and palpitations.  Gastrointestinal: Negative for abdominal pain, constipation, diarrhea, nausea and vomiting.  Genitourinary: Negative for dysuria.  Musculoskeletal: Negative for myalgias.  Neurological: Negative for dizziness, speech change, focal weakness, seizures and headaches.  Psychiatric/Behavioral: Negative for depression.    DRUG ALLERGIES:  No Known Allergies  VITALS:  Blood pressure 124/69, pulse (!) 56, temperature 98.1 F (36.7 C), temperature source Oral, resp. rate 19, height 6\' 1"  (1.854 m), weight 64.9 kg (143 lb), SpO2 100 %.  PHYSICAL EXAMINATION:  Physical Exam  GENERAL:  62 y.o.-year-old ill nourished patient lying in the bed with no acute distress.  EYES: Pupils equal, round, reactive to light and accommodation. No scleral icterus. Extraocular muscles intact.  HEENT: Head atraumatic, normocephalic. Oropharynx and nasopharynx clear.  NECK:  Supple, no jugular venous distention. No thyroid enlargement, no tenderness.  LUNGS: Normal breath sounds bilaterally, decreased upper lobe breath sounds, no wheezing, rales or crepitation. No use of accessory muscles of respiration.  CARDIOVASCULAR: S1, S2 normal. No murmurs, rubs, or gallops.  ABDOMEN: Soft,  nontender, nondistended. Bowel sounds present. No organomegaly or mass.  EXTREMITIES: No pedal edema, cyanosis, or clubbing.  NEUROLOGIC: Cranial nerves II through XII are intact. Muscle strength 5/5 in all extremities. Sensation intact. Gait not checked.  PSYCHIATRIC: The patient is alert and oriented x 3.  SKIN: No obvious rash, lesion, or ulcer.    LABORATORY PANEL:   CBC Recent Labs  Lab 08/09/17 0116  WBC 8.3  HGB 13.4  HCT 41.2  PLT 218   ------------------------------------------------------------------------------------------------------------------  Chemistries  Recent Labs  Lab 08/09/17 0116  08/11/17 0404  NA 127*   < > 135  K 3.0*   < > 4.5  CL 89*   < > 105  CO2 28   < > 19*  GLUCOSE 124*   < > 93  BUN 7   < > 7  CREATININE 0.72   < > 0.73  CALCIUM 8.3*   < > 8.9  MG  --    < > 2.1  AST 26  --   --   ALT 13*  --   --   ALKPHOS 65  --   --   BILITOT 0.8  --   --    < > = values in this interval not displayed.   ------------------------------------------------------------------------------------------------------------------  Cardiac Enzymes Recent Labs  Lab 08/09/17 0116  TROPONINI <0.03   ------------------------------------------------------------------------------------------------------------------  RADIOLOGY:  No results found.  EKG:   Orders placed or performed during the hospital encounter of 08/09/17  . ED EKG  . ED EKG  . EKG 12-Lead  . EKG 12-Lead    ASSESSMENT AND PLAN:   62 year old male with past medical history significant for alcohol abuse, anxiety and depression presents to hospital secondary  to ongoing nausea and vomiting associated with night sweats and weight loss.  1. Right upper lobe necrotizing pneumonia-could be aspiration pneumonia since he is an alcoholic, -  cannot rule out underlying tuberculosis or tumor. -Patient has had history of tuberculosis that was treated in 2007.  Also has history of  incarceration -Sputum culture sent, AFB smear x 1 is negative, second smear is pending - Infectious disease consult is appreciated -MRSA PCR is negative, discontinued vancomycin.  Currently only on Zosyn -Continue IV antibiotics for now.   - might need repeat CT follow-up as outpatient to rule out underlying malignancy -off supplemental oxygen. -If AFB remains negative, will discharge on Augmentin for 4 weeks and outpatient follow-up.  2. Hyponatremia and hypokalemia-replaced appropriately with fluids If appetite is improved, can discontinue fluids  3. Severe malnutrition-appreciate dietitian input.  4. Alcohol abuse-monitor for any withdrawals. On Ativan as needed and -also polysubstance abuse. Counseled on admission  5. DVT prophylaxis- no further hemoptysis. We'll start Lovenox    All the records are reviewed and case discussed with Care Management/Social Workerr. Management plans discussed with the patient, family and they are in agreement.  CODE STATUS: Full Code  TOTAL TIME TAKING CARE OF THIS PATIENT: 36 minutes.   POSSIBLE D/C IN 1-2 DAYS, DEPENDING ON CLINICAL CONDITION.   Gladstone Lighter M.D on 08/11/2017 at 4:04 PM  Between 7am to 6pm - Pager - [phone]  After 6pm go to [email or web address] - password [email or web address] [facility]  Office  [phone]  CC: Primary care physician; Patient, [name]

## 2017-08-11 NOTE — Clinical Social Work Note (Addendum)
Clinical Social Work Assessment  Patient Details  Name: Wayne Chapman MRN: [id number] Date of Birth: 06-20-1956  Date of referral:  08/11/17               Reason for consult:  Substance Use/ETOH Abuse                Permission sought to share information with:    Permission granted to share information::     Name::        Agency::     Relationship::     Contact Information:     Housing/Transportation Living arrangements for the past 2 months:  Single Family Home Source of Information:  Patient Patient Interpreter Needed:  None Criminal Activity/Legal Involvement Pertinent to Current Situation/Hospitalization:  No - Comment as needed Significant Relationships:  Significant Other Lives with:  Significant Other Do you feel safe going back to the place where you live?  Yes Need for family participation in patient care:  No (Coment)  Care giving concerns:  Patient lives in [place] with his Wayne Chapman [phone]).   Social Worker assessment / plan: Holiday representative ([place]) received verbal consult during progression meeting for substance abuse. Social work Theatre manager met with patient alone at bedside. Patient was laying down in the bed and was alert and oriented x4. Social work Theatre manager introduced self and explained the role of the [place]. Patient shared he lives with Engineer, maintenance in [place]. Patient also shared that he drinks every other day and is interested in receiving help. Social work Theatre manager provided [employer] and highlighted the [employer]. Patient stated that he is aware of [facility] and has been before but could not make all his appointments because lack of transportation. Patient also shared he is aware of the [facility] and can now transport there. Patient plans to schedule an appointment once he is discharged. CSW and social work Theatre manager will continue to follow up and assist.  Employment status:  Retired Forensic scientist:  Medicare PT Recommendations:   Not assessed at this time Information / Referral to community resources:     Patient/Family's Response to care: Patient plans to reach out to the [place] [place] after discharge and schedule an appointment.  Patient/Family's Understanding of and Emotional Response to Diagnosis, Current Treatment, and Prognosis: Patient was pleasant to social work Theatre manager and thanked her for her assistance.  Emotional Assessment Appearance:  Appears stated age Attitude/Demeanor/Rapport:    Affect (typically observed):  Accepting, Pleasant, Calm Orientation:  Oriented to Self, Oriented to Place, Oriented to  Time, Oriented to Situation Alcohol / Substance use:  Alcohol Use Psych involvement (Current and /or in the community):  No (Comment)  Discharge Needs  Concerns to be addressed:  Substance Abuse Concerns Readmission within the last 30 days:    Current discharge risk:  Substance Abuse Barriers to Discharge:  Continued Medical Work up   Wayne Chapman, Student-Social Work 08/11/2017, 11:50 AM

## 2017-08-11 NOTE — Progress Notes (Signed)
[name] INFECTIOUS DISE[facility] PROGRESS NOTE Date of Admission:  08/09/2017     Wayne Chapman is a 62 y.o. male with  Severe PNA Active Problems:   Hemoptysis   Protein-calorie malnutrition, severe   Necrotizing pneumonia (HCC)   Subjective: Out of unit, no fevers, less cough.  ROS  Eleven systems are reviewed and negative except per hpi  Medications:  Antibiotics Given (last 72 hours)    Date/Time Action Medication Dose Rate   08/09/17 0723 New Bag/Given   meropenem (MERREM) 1 g in sodium chloride 0.9 % 100 mL IVPB 1 g 200 mL/hr   08/09/17 0724 New Bag/Given   azithromycin (ZITHROMAX) 500 mg in sodium chloride 0.9 % 250 mL IVPB 500 mg 250 mL/hr   08/09/17 0912 New Bag/Given   vancomycin (VANCOCIN) IVPB 1000 mg/200 mL premix 1,000 mg 200 mL/hr   08/09/17 0913 New Bag/Given   piperacillin-tazobactam (ZOSYN) IVPB 3.375 g 3.375 g 12.5 mL/hr   08/09/17 1606 New Bag/Given   piperacillin-tazobactam (ZOSYN) IVPB 3.375 g 3.375 g 12.5 mL/hr   08/09/17 1606 New Bag/Given   vancomycin (VANCOCIN) IVPB 1000 mg/200 mL premix 1,000 mg 200 mL/hr   08/10/17 0153 New Bag/Given   piperacillin-tazobactam (ZOSYN) IVPB 3.375 g 3.375 g 12.5 mL/hr   08/10/17 0854 New Bag/Given   piperacillin-tazobactam (ZOSYN) IVPB 3.375 g 3.375 g 12.5 mL/hr   08/10/17 1650 New Bag/Given   piperacillin-tazobactam (ZOSYN) IVPB 3.375 g 3.375 g 12.5 mL/hr   08/11/17 0156 New Bag/Given   piperacillin-tazobactam (ZOSYN) IVPB 3.375 g 3.375 g 12.5 mL/hr   08/11/17 0843 New Bag/Given   piperacillin-tazobactam (ZOSYN) IVPB 3.375 g 3.375 g 12.5 mL/hr     . docusate sodium  100 mg Oral BID  . feeding supplement (ENSURE ENLIVE)  237 mL Oral TID BM  . folic acid  1 mg Oral Daily  . LORazepam  0-4 mg Intravenous Q12H  . multivitamin with minerals  1 tablet Oral Daily  . thiamine  100 mg Oral Daily    Objective: Vital signs in last 24 hours: Temp:  [97.8 F (36.6 C)-98.2 F (36.8 C)] 98.1 F (36.7 C) (02/14  0800) Pulse Rate:  [56-68] 56 (02/14 0800) Resp:  [19-22] 19 (02/14 0800) BP: (101-128)/(59-84) 124/69 (02/14 0800) SpO2:  [100 %] 100 % (02/14 0800) Weight:  [64.9 kg (143 lb)] 64.9 kg (143 lb) (02/14 0256) Constitutional: He is oriented to person, place, and time. Thin, nad HENT: anicteric, muddy sclera Mouth/Throat: Oropharynx is clear and moist. Edentulous, has dentures Cardiovascular: Normal rate, regular rhythm and normal heart sounds. Exam reveals no gallop and no friction rub.  No murmur heard.  Pulmonary/Chest: bil rhonhci Abdominal: Soft. Bowel sounds are normal. He exhibits no distension. There is no tenderness.  Lymphadenopathy:  He has no cervical adenopathy.  Neurological: He is alert and oriented to person, place, and time.  Skin: Skin is warm and dry. No rash noted. No erythema.  Psychiatric: He has a normal mood and affect. His behavior is normal.     Lab Results Recent Labs    08/09/17 0116 08/10/17 0537 08/11/17 0404  WBC 8.3  --   --   HGB 13.4  --   --   HCT 41.2  --   --   NA 127* 133* 135  K 3.0* 3.6 4.5  CL 89* 103 105  CO2 28 23 19*  BUN 7 10 7   CREATININE 0.72 0.68 0.73    Microbiology: Results for orders placed or  performed during the hospital encounter of 08/09/17  Blood culture (routine x 2)     Status: None (Preliminary result)   Collection Time: 08/09/17  6:04 AM  Result Value Ref Range Status   Specimen Description BLOOD LEFT FA  Final   Special Requests   Final    BOTTLES DRAWN AEROBIC AND ANAEROBIC Blood Culture results may not be optimal due to an excessive volume of blood received in culture bottles   Culture   Final    NO GROWTH 2 DAYS Performed at [facility], [address] [place], [place] [address]    Report Status PENDING  Incomplete  Blood culture (routine x 2)     Status: None (Preliminary result)   Collection Time: 08/09/17  6:04 AM  Result Value Ref Range Status   Specimen Description BLOOD LEFT AC  Final    Special Requests   Final    BOTTLES DRAWN AEROBIC AND ANAEROBIC Blood Culture results may not be optimal due to an excessive volume of blood received in culture bottles   Culture   Final    NO GROWTH 2 DAYS Performed at [facility], [address] [place], [place] [address]    Report Status PENDING  Incomplete  MRSA PCR Screening     Status: None   Collection Time: 08/09/17  8:42 AM  Result Value Ref Range Status   MRSA by PCR NEGATIVE NEGATIVE Final    Comment:        The GeneXpert MRSA Assay (FDA approved for NASAL specimens only), is one component of a comprehensive MRSA colonization surveillance program. It is not intended to diagnose MRSA infection nor to guide or monitor treatment for MRSA infections. Performed at [facility], [address] [place], [place] [address]   Culture, expectorated sputum-assessment     Status: None   Collection Time: 08/09/17 11:44 AM  Result Value Ref Range Status   Specimen Description Expect. Sput  Final   Special Requests Normal  Final   Sputum evaluation   Final    THIS SPECIMEN IS ACCEPTABLE FOR SPUTUM CULTURE Performed at [facility], [address] [place], [place] [address]    Report Status 08/09/2017 FINAL  Final  Acid Fast Smear (AFB)     Status: None   Collection Time: 08/09/17 11:44 AM  Result Value Ref Range Status   AFB Specimen Processing Concentration  Final   Acid Fast Smear Negative  Final    Comment: (NOTE) Performed At: [facility] [address] [place], [place] [id number] Rush Farmer MD [id number]    Source (AFB) EXPECTORATED SPUTUM  Final    Comment: Performed at [facility], [address] [place], [place] [address]  Culture, respiratory (NON-Expectorated)     Status: None (Preliminary result)   Collection Time: 08/09/17 11:44 AM  Result Value Ref Range Status   Specimen Description   Final    Expect. Sput Performed at [facility],  [address] [place], [place] [address]    Special Requests   Final    Normal Reflexed from [id number] Performed at [facility], [place], [place] [address]    Gram Stain   Final    MODERATE WBC PRESENT, PREDOMINANTLY PMN NO SQUAMOUS EPITHELIAL CELLS SEEN FEW GRAM POSITIVE COCCI IN CLUSTERS FEW GRAM POSITIVE RODS    Culture   Final    CULTURE REINCUBATED FOR BETTER GROWTH Performed at [facility], [place]  [address] [place], [place] [address]    Report Status PENDING  Incomplete    Studies/Results: No results found.  Assessment/Plan: DOIL KAMARA is a 62 y.o. male with hx of tobacco, etoh (3-4 40 oz beer daily) as well as cocaine use admitted with RUL necrotizing PNA.  He has hx Latent TB treated in 2007. Also has hx of incarceration. HIV test pending.  Flu PCR negative.  He likely has aspiration PNA given substance use but some concern for malignancy and post obstructive PNA esp given his LAN noted on CT but could be reactive. 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  The sputum at bedside is thick and brown in character and is a large amount.  Suspect mixed, anaerobic infection  Will need wu for TB, coverage for aspiration PNA, and eventual fu imaging. Will likely need prolonged oral therapy. Hopefully will have growth on culture to guide therapy.   2/14- no fevers, out of unit. AFB neg 2/12.  Recommendations Cont zosyn Await cultures. If cultures unrevealing will plan to treat for 4 weeks with augmentin with otpt fu to ensure resolution prior to stopping abx.   Thank you very much for the consult. Will follow with you.  Leonel Ramsay   08/11/2017, 3:28 PM

## 2017-08-12 LAB — CBC
HCT: 42 % (ref 40.0–52.0)
Hemoglobin: 13.4 g/dL (ref 13.0–18.0)
MCH: 24.8 pg — AB (ref 26.0–34.0)
MCHC: 31.9 g/dL — ABNORMAL LOW (ref 32.0–36.0)
MCV: 77.7 fL — ABNORMAL LOW (ref 80.0–100.0)
PLATELETS: 328 10*3/uL (ref 150–440)
RBC: 5.4 MIL/uL (ref 4.40–5.90)
RDW: 15.4 % — ABNORMAL HIGH (ref 11.5–14.5)
WBC: 5.3 10*3/uL (ref 3.8–10.6)

## 2017-08-12 LAB — BASIC METABOLIC PANEL
Anion gap: 12 (ref 5–15)
BUN: 11 mg/dL (ref 6–20)
CO2: 22 mmol/L (ref 22–32)
Calcium: 9.3 mg/dL (ref 8.9–10.3)
Chloride: 101 mmol/L (ref 101–111)
Creatinine, Ser: 0.71 mg/dL (ref 0.61–1.24)
Glucose, Bld: 101 mg/dL — ABNORMAL HIGH (ref 65–99)
POTASSIUM: 4.2 mmol/L (ref 3.5–5.1)
SODIUM: 135 mmol/L (ref 135–145)

## 2017-08-12 LAB — CULTURE, RESPIRATORY W GRAM STAIN: Culture: NORMAL

## 2017-08-12 LAB — CULTURE, RESPIRATORY: SPECIAL REQUESTS: NORMAL

## 2017-08-12 MED ORDER — AMOXICILLIN-POT CLAVULANATE 875-125 MG PO TABS: 1.0000 | ORAL_TABLET | Freq: Two times a day (BID) | ORAL | 0 refills | Status: AC

## 2017-08-12 NOTE — Progress Notes (Signed)
Per RN patient does not have a ride home today. Clinical Education officer, museum (CSW) contacted patient's brother Simona Huh who stated that he is going to work at 26 but he will come pick patient up in a few minutes. RN aware of above.   [employer], LCSW [phone]

## 2017-08-12 NOTE — Progress Notes (Signed)
Pt ready for d/c home today per MD. Discharge instructions and prescription reviewed with pt, he denies questions. Pts VSS, in NAD. PIV removed. Pt's brother, Simona Huh, will be picking him up.  [place], Jerry Caras

## 2017-08-12 NOTE — Consult Note (Signed)
Pharmacy Antibiotic Note  Wayne Chapman is a 61 y.o. male admitted on 08/09/2017 with aspiration PNA.  Pharmacy has been consulted for zosyn dosing.  Plan: Zosyn 3.375g IV q8h (4 hour infusion).  Height: 6\' 1"  (185.4 cm) Weight: 144 lb 8 oz (65.5 kg) IBW/kg (Calculated) : 79.9  Temp (24hrs), Avg:98.2 F (36.8 C), Min:98 F (36.7 C), Max:98.6 F (37 C)  Recent Labs  Lab 08/09/17 0116 08/10/17 0537 08/11/17 0404 08/12/17 0803  WBC 8.3  --   --  5.3  CREATININE 0.72 0.68 0.73 0.71    Estimated Creatinine Clearance: 91 mL/min (by C-G formula based on SCr of 0.71 mg/dL).    No Known Allergies  Antimicrobials this admission: 2/12 vancomycin >> 2/12 2/12 Zosyn  >>     Dose adjustments this admission:   Microbiology results: 2/12 BCx: NG 2/12 Sputum: few GPC, few gram positive rods  2/12 MRSA PCR: neg Acid fast neg x1 waiting second lab  Thank you for allowing pharmacy to be a part of this patient's care.  Ramond Dial, Pharm.D, BCPS Clinical Pharmacist  08/12/2017 10:10 AM

## 2017-08-12 NOTE — Discharge Summary (Signed)
[facility] [place] NAME: Wayne Chapman    MR#:  [id number]  DATE OF BIRTH:  12-10-1955  DATE OF ADMISSION:  08/09/2017   ADMITTING PHYSICIAN: Harrie Foreman, MD  DATE OF DISCHARGE: 08/12/2017  PRIMARY CARE PHYSICIAN: Patient, [name]   ADMISSION DIAGNOSIS:   Necrotizing pneumonia ([facility]) [J85.0] Non-intractable vomiting with nausea, unspecified vomiting type [R11.2]  DISCHARGE DIAGNOSIS:   Active Problems:   Hemoptysis   Protein-calorie malnutrition, severe   Necrotizing pneumonia ([place])   SECONDARY DIAGNOSIS:   Past Medical History:  Diagnosis Date  . Anxiety   . Depression     HOSPITAL COURSE:   62 year old male with past medical history significant for alcohol abuse, anxiety and depression presents to hospital secondary to ongoing nausea and vomiting associated with night sweats and weight loss.  1. Right upper lobe necrotizing pneumonia-could be aspiration pneumonia since he is an alcoholic, - was admitted to r/o tuberculosis or tumor. -Patient has had history of tuberculosis that was treated in 2007.  Also has history of incarceration -Sputum culture growing gram-positive cocci in clusters and some positive rods, AFB smear x 1 is negative, second smear is pending - Infectious disease consult is appreciated -MRSA PCR is negative, received vancomycin and Zosyn in the hospital. Vancomycin has been discontinued after day 1. Will be discharged on Augmentin for 4 weeks and will need an outpatient CT   - might need repeat CT follow-up as outpatient to ensure healing and rule out underlying malignancy -off supplemental oxygen.  2. Hyponatremia and hypokalemia-replaced appropriately with fluids  3. Severe malnutrition-appreciate dietitian input.  4. Alcohol abuse- was on Ativan as needed for withdrawals.   Clinically much improved. Ambulating well without any discomfort. Will be discharged if his second AFB is  negative   DISCHARGE CONDITIONS:   Guarded  CONSULTS OBTAINED:   Treatment Team:  Leonel Ramsay, MD  DRUG ALLERGIES:   No Known Allergies DISCHARGE MEDICATIONS:   Allergies as of 08/12/2017   No Known Allergies     Medication List    TAKE these medications   amoxicillin-clavulanate 875-125 MG tablet Commonly known as:  AUGMENTIN Take 1 tablet by mouth 2 (two) times daily for 28 days. X 4 weeks        DISCHARGE INSTRUCTIONS:   1. PCP follow-up in 1-2 weeks 2. ID follow up in 3 weeks 3. Will need an outpatient CT chest for follow-up in 2-3 weeks  DIET:   Regular diet  ACTIVITY:   Activity as tolerated  OXYGEN:   Home Oxygen: No.  Oxygen Delivery: room air  DISCHARGE LOCATION:   home   If you experience worsening of your admission symptoms, develop shortness of breath, life threatening emergency, suicidal or homicidal thoughts you must seek medical attention immediately by calling 911 or calling your MD immediately  if symptoms less severe.  You Must read complete instructions/literature along with all the possible adverse reactions/side effects for all the Medicines you take and that have been prescribed to you. Take any new Medicines after you have completely understood and accpet all the possible adverse reactions/side effects.   Please note  You were cared for by a hospitalist during your hospital stay. If you have any questions about your discharge medications or the care you received while you were in the hospital after you are discharged, you can call the unit and asked to speak with the hospitalist on call if the hospitalist that  took care of you is not available. Once you are discharged, your primary care physician will handle any further medical issues. Please note that NO REFILLS for any discharge medications will be authorized once you are discharged, as it is imperative that you return to your primary care physician (or establish a  relationship with a primary care physician if you do not have one) for your aftercare needs so that they can reassess your need for medications and monitor your lab values.    On the day of Discharge:  VITAL SIGNS:   Blood pressure 102/69, pulse 61, temperature 98 F (36.7 C), temperature source Oral, resp. rate 16, height 6\' 1"  (1.854 m), weight 65.5 kg (144 lb 8 oz), SpO2 100 %.  PHYSICAL EXAMINATION:    GENERAL:  62 y.o.-year-old ill nourished patient lying in the bed with no acute distress.  EYES: Pupils equal, round, reactive to light and accommodation. No scleral icterus. Extraocular muscles intact.  HEENT: Head atraumatic, normocephalic. Oropharynx and nasopharynx clear.  NECK:  Supple, no jugular venous distention. No thyroid enlargement, no tenderness.  LUNGS: Normal breath sounds bilaterally, decreased upper lobe breath sounds, no wheezing, rales or crepitation. No use of accessory muscles of respiration.  CARDIOVASCULAR: S1, S2 normal. No murmurs, rubs, or gallops.  ABDOMEN: Soft, nontender, nondistended. Bowel sounds present. No organomegaly or mass.  EXTREMITIES: No pedal edema, cyanosis, or clubbing.  NEUROLOGIC: Cranial nerves II through XII are intact. Muscle strength 5/5 in all extremities. Sensation intact. Gait not checked.  PSYCHIATRIC: The patient is alert and oriented x 3.  SKIN: No obvious rash, lesion, or ulcer.     DATA REVIEW:   CBC Recent Labs  Lab 08/12/17 0803  WBC 5.3  HGB 13.4  HCT 42.0  PLT 328    Chemistries  Recent Labs  Lab 08/09/17 0116  08/11/17 0404 08/12/17 0803  NA 127*   < > 135 135  K 3.0*   < > 4.5 4.2  CL 89*   < > 105 101  CO2 28   < > 19* 22  GLUCOSE 124*   < > 93 101*  BUN 7   < > 7 11  CREATININE 0.72   < > 0.73 0.71  CALCIUM 8.3*   < > 8.9 9.3  MG  --    < > 2.1  --   AST 26  --   --   --   ALT 13*  --   --   --   ALKPHOS 65  --   --   --   BILITOT 0.8  --   --   --    < > = values in this interval not  displayed.     Microbiology Results  Results for orders placed or performed during the hospital encounter of 08/09/17  Blood culture (routine x 2)     Status: None (Preliminary result)   Collection Time: 08/09/17  6:04 AM  Result Value Ref Range Status   Specimen Description BLOOD LEFT FA  Final   Special Requests   Final    BOTTLES DRAWN AEROBIC AND ANAEROBIC Blood Culture results may not be optimal due to an excessive volume of blood received in culture bottles   Culture   Final    NO GROWTH 3 DAYS Performed at [facility], [address], [place], [place] [address]    Report Status PENDING  Incomplete  Blood culture (routine x 2)     Status: None (Preliminary  result)   Collection Time: 08/09/17  6:04 AM  Result Value Ref Range Status   Specimen Description BLOOD LEFT AC  Final   Special Requests   Final    BOTTLES DRAWN AEROBIC AND ANAEROBIC Blood Culture results may not be optimal due to an excessive volume of blood received in culture bottles   Culture   Final    NO GROWTH 3 DAYS Performed at [facility], [address] [place], [place] [address]    Report Status PENDING  Incomplete  MRSA PCR Screening     Status: None   Collection Time: 08/09/17  8:42 AM  Result Value Ref Range Status   MRSA by PCR NEGATIVE NEGATIVE Final    Comment:        The GeneXpert MRSA Assay (FDA approved for NASAL specimens only), is one component of a comprehensive MRSA colonization surveillance program. It is not intended to diagnose MRSA infection nor to guide or monitor treatment for MRSA infections. Performed at [facility], [address] [place], [place] [address]   Culture, expectorated sputum-assessment     Status: None   Collection Time: 08/09/17 11:44 AM  Result Value Ref Range Status   Specimen Description Expect. Sput  Final   Special Requests Normal  Final   Sputum evaluation   Final    THIS SPECIMEN IS ACCEPTABLE FOR SPUTUM  CULTURE Performed at [facility], [address] [place], [place] [address]    Report Status 08/09/2017 FINAL  Final  Acid Fast Smear (AFB)     Status: None   Collection Time: 08/09/17 11:44 AM  Result Value Ref Range Status   AFB Specimen Processing Concentration  Final   Acid Fast Smear Negative  Final    Comment: (NOTE) Performed At: [facility] [address] [place], [place] [id number] Rush Farmer MD [id number]    Source (AFB) EXPECTORATED SPUTUM  Final    Comment: Performed at [facility], [address] [place], [place] [address]  Culture, respiratory (NON-Expectorated)     Status: None   Collection Time: 08/09/17 11:44 AM  Result Value Ref Range Status   Specimen Description   Final    Expect. Sput Performed at [facility], [address] [place], [place] [address]    Special Requests   Final    Normal Reflexed from [id number] Performed at [facility], [place], [place] [address]    Gram Stain   Final    MODERATE WBC PRESENT, PREDOMINANTLY PMN NO SQUAMOUS EPITHELIAL CELLS SEEN FEW GRAM POSITIVE COCCI IN CLUSTERS FEW GRAM POSITIVE RODS    Culture   Final    Consistent with normal respiratory flora. Performed at [facility], [place] [address] [place], [place] [address]    Report Status 08/12/2017 FINAL  Final    RADIOLOGY:  No results found.   Management plans discussed with the patient, family and they are in agreement.  CODE STATUS:     Code Status Orders  (From admission, onward)        Start     Ordered   08/09/17 0842  Full code  Continuous     08/09/17 0841    Code Status History    Date Active Date Inactive Code Status Order ID Comments User Context   This patient has a current code status but no historical code status.      TOTAL TIME TAKING CARE OF THIS PATIENT: 38 minutes.  Gladstone Lighter M.D on 08/12/2017 at 1:52 PM  Between 7am to 6pm - Pager -  [phone]  After 6pm go to [email or web address] - Proofreader  [employer] [facility] [facility]  Office  [phone]  CC: Primary care physician; Patient, [name]   Note: This dictation was prepared with Dragon dictation along with smaller phrase technology. Any transcriptional errors that result from this process are unintentional.

## 2017-08-13 LAB — ACID FAST SMEAR (AFB): ACID FAST SMEAR - AFSCU2: NEGATIVE

## 2017-08-13 LAB — ACID FAST SMEAR (AFB, MYCOBACTERIA)

## 2017-08-14 LAB — CULTURE, BLOOD (ROUTINE X 2)
CULTURE: NO GROWTH
Culture: NO GROWTH

## 2017-08-15 LAB — QUANTIFERON-TB GOLD PLUS (RQFGPL)
QuantiFERON Mitogen Value: 0.17 IU/mL
QuantiFERON Nil Value: 0.02 IU/mL
QuantiFERON TB1 Ag Value: 0.03 IU/mL
QuantiFERON TB2 Ag Value: 0.02 IU/mL

## 2017-08-15 LAB — QUANTIFERON-TB GOLD PLUS: QuantiFERON-TB Gold Plus: UNDETERMINED

## 2017-08-18 LAB — ACID FAST SMEAR (AFB, MYCOBACTERIA)

## 2017-08-18 LAB — ACID FAST SMEAR (AFB): ACID FAST SMEAR - AFSCU2: NEGATIVE

## 2017-09-23 LAB — ACID FAST CULTURE WITH REFLEXED SENSITIVITIES

## 2017-09-23 LAB — ACID FAST CULTURE WITH REFLEXED SENSITIVITIES (MYCOBACTERIA): Acid Fast Culture: NEGATIVE

## 2017-10-05 LAB — ACID FAST CULTURE WITH REFLEXED SENSITIVITIES (MYCOBACTERIA): Acid Fast Culture: NEGATIVE

## 2017-11-09 ENCOUNTER — Inpatient Hospital Stay
Admission: EM | Admit: 2017-11-09 | Discharge: 2017-11-13 | DRG: 177 | Disposition: A | Discharge status: Home / Self Care | Payer: Medicare Other | Attending: Internal Medicine | Admitting: Internal Medicine

## 2017-11-09 ENCOUNTER — Emergency Department: Payer: Medicare Other

## 2017-11-09 ENCOUNTER — Other Ambulatory Visit: Payer: Self-pay

## 2017-11-09 ENCOUNTER — Encounter: Payer: Self-pay | Admitting: Emergency Medicine

## 2017-11-09 DIAGNOSIS — R918 Other nonspecific abnormal finding of lung field: Secondary | ICD-10-CM | POA: Diagnosis present

## 2017-11-09 DIAGNOSIS — R042 Hemoptysis: Secondary | ICD-10-CM | POA: Diagnosis present

## 2017-11-09 DIAGNOSIS — Z681 Body mass index (BMI) 19 or less, adult: Secondary | ICD-10-CM | POA: Diagnosis not present

## 2017-11-09 DIAGNOSIS — F17210 Nicotine dependence, cigarettes, uncomplicated: Secondary | ICD-10-CM | POA: Diagnosis present

## 2017-11-09 DIAGNOSIS — F1020 Alcohol dependence, uncomplicated: Secondary | ICD-10-CM | POA: Diagnosis present

## 2017-11-09 DIAGNOSIS — E222 Syndrome of inappropriate secretion of antidiuretic hormone: Secondary | ICD-10-CM | POA: Diagnosis present

## 2017-11-09 DIAGNOSIS — K921 Melena: Secondary | ICD-10-CM | POA: Diagnosis present

## 2017-11-09 DIAGNOSIS — J850 Gangrene and necrosis of lung: Secondary | ICD-10-CM | POA: Diagnosis present

## 2017-11-09 DIAGNOSIS — F418 Other specified anxiety disorders: Secondary | ICD-10-CM | POA: Diagnosis not present

## 2017-11-09 DIAGNOSIS — Z8701 Personal history of pneumonia (recurrent): Secondary | ICD-10-CM

## 2017-11-09 DIAGNOSIS — E43 Unspecified severe protein-calorie malnutrition: Secondary | ICD-10-CM | POA: Diagnosis present

## 2017-11-09 DIAGNOSIS — R627 Adult failure to thrive: Secondary | ICD-10-CM | POA: Diagnosis present

## 2017-11-09 DIAGNOSIS — J188 Other pneumonia, unspecified organism: Secondary | ICD-10-CM | POA: Diagnosis not present

## 2017-11-09 DIAGNOSIS — Z79899 Other long term (current) drug therapy: Secondary | ICD-10-CM | POA: Diagnosis not present

## 2017-11-09 LAB — STREP PNEUMONIAE URINARY ANTIGEN: Strep Pneumo Urinary Antigen: NEGATIVE

## 2017-11-09 LAB — PROTIME-INR
INR: 1.07
PROTHROMBIN TIME: 13.8 s (ref 11.4–15.2)

## 2017-11-09 LAB — BASIC METABOLIC PANEL
Anion gap: 11 (ref 5–15)
BUN: 11 mg/dL (ref 6–20)
CALCIUM: 9.5 mg/dL (ref 8.9–10.3)
CO2: 28 mmol/L (ref 22–32)
Chloride: 89 mmol/L — ABNORMAL LOW (ref 101–111)
Creatinine, Ser: 0.7 mg/dL (ref 0.61–1.24)
GFR calc Af Amer: >60 mL/min (ref >60)
GLUCOSE: 110 mg/dL — AB (ref 65–99)
POTASSIUM: 4.2 mmol/L (ref 3.5–5.1)
SODIUM: 128 mmol/L — AB (ref 135–145)

## 2017-11-09 LAB — MRSA PCR SCREENING: MRSA by PCR: NEGATIVE

## 2017-11-09 LAB — SODIUM, URINE, RANDOM: SODIUM UR: 38 mmol/L

## 2017-11-09 LAB — LACTIC ACID, PLASMA: LACTIC ACID, VENOUS: 1.2 mmol/L (ref 0.5–1.9)

## 2017-11-09 LAB — CHLORIDE, URINE, RANDOM: Chloride Urine: 18 mmol/L

## 2017-11-09 LAB — CBC
HCT: 44.1 % (ref 40.0–52.0)
Hemoglobin: 14.3 g/dL (ref 13.0–18.0)
MCH: 25.1 pg — ABNORMAL LOW (ref 26.0–34.0)
MCHC: 32.4 g/dL (ref 32.0–36.0)
MCV: 77.5 fL — AB (ref 80.0–100.0)
PLATELETS: 194 10*3/uL (ref 150–440)
RBC: 5.68 MIL/uL (ref 4.40–5.90)
RDW: 15.1 % — ABNORMAL HIGH (ref 11.5–14.5)
WBC: 6.7 10*3/uL (ref 3.8–10.6)

## 2017-11-09 LAB — OSMOLALITY, URINE: OSMOLALITY UR: 231 mosm/kg — AB (ref 300–900)

## 2017-11-09 LAB — APTT: APTT: 34 s (ref 24–36)

## 2017-11-09 LAB — PROCALCITONIN

## 2017-11-09 MED ORDER — IPRATROPIUM-ALBUTEROL 0.5-2.5 (3) MG/3ML IN SOLN
3.0000 mL | Freq: Once | RESPIRATORY_TRACT | Status: AC
  Administered 2017-11-09: 3 mL via RESPIRATORY_TRACT
  Filled 2017-11-09: qty 3

## 2017-11-09 MED ORDER — FOLIC ACID 1 MG PO TABS
1.0000 mg | ORAL_TABLET | Freq: Every day | ORAL | Status: DC
  Administered 2017-11-10 – 2017-11-13 (×4): 1 mg via ORAL
  Filled 2017-11-09 (×4): qty 1

## 2017-11-09 MED ORDER — PIPERACILLIN-TAZOBACTAM 3.375 G IVPB 30 MIN
3.3750 g | Freq: Once | INTRAVENOUS | Status: AC
  Administered 2017-11-09: 3.375 g via INTRAVENOUS
  Filled 2017-11-09: qty 50

## 2017-11-09 MED ORDER — LORAZEPAM 2 MG/ML IJ SOLN: 1.0000 mg | Freq: Four times a day (QID) | INTRAMUSCULAR | Status: DC | PRN

## 2017-11-09 MED ORDER — VANCOMYCIN HCL IN DEXTROSE 1-5 GM/200ML-% IV SOLN
1000.0000 mg | Freq: Once | INTRAVENOUS | Status: AC
  Administered 2017-11-09: 1000 mg via INTRAVENOUS
  Filled 2017-11-09: qty 200

## 2017-11-09 MED ORDER — THIAMINE HCL 100 MG/ML IJ SOLN
100.0000 mg | Freq: Every day | INTRAMUSCULAR | Status: DC
  Filled 2017-11-09 (×5): qty 1

## 2017-11-09 MED ORDER — NICOTINE 14 MG/24HR TD PT24
14.0000 mg | MEDICATED_PATCH | Freq: Every day | TRANSDERMAL | Status: DC
  Administered 2017-11-09 – 2017-11-12 (×4): 14 mg via TRANSDERMAL
  Filled 2017-11-09 (×5): qty 1

## 2017-11-09 MED ORDER — LORAZEPAM 1 MG PO TABS: 1.0000 mg | ORAL_TABLET | Freq: Four times a day (QID) | ORAL | Status: DC | PRN

## 2017-11-09 MED ORDER — M.V.I. ADULT IV INJ
INJECTION | INTRAVENOUS | Status: AC
  Administered 2017-11-09 – 2017-11-10 (×2): via INTRAVENOUS
  Filled 2017-11-09 (×2): qty 1000

## 2017-11-09 MED ORDER — PIPERACILLIN-TAZOBACTAM 3.375 G IVPB
3.3750 g | Freq: Three times a day (TID) | INTRAVENOUS | Status: DC
  Administered 2017-11-09 – 2017-11-13 (×11): 3.375 g via INTRAVENOUS
  Filled 2017-11-09 (×11): qty 50

## 2017-11-09 MED ORDER — VITAMIN B-1 100 MG PO TABS
100.0000 mg | ORAL_TABLET | Freq: Every day | ORAL | Status: DC
  Administered 2017-11-10 – 2017-11-13 (×4): 100 mg via ORAL
  Filled 2017-11-09 (×4): qty 1

## 2017-11-09 MED ORDER — IOPAMIDOL (ISOVUE-370) INJECTION 76%
75.0000 mL | Freq: Once | INTRAVENOUS | Status: AC | PRN
  Administered 2017-11-09: 75 mL via INTRAVENOUS

## 2017-11-09 MED ORDER — VANCOMYCIN HCL IN DEXTROSE 1-5 GM/200ML-% IV SOLN
1000.0000 mg | Freq: Two times a day (BID) | INTRAVENOUS | Status: DC
  Administered 2017-11-09 – 2017-11-10 (×2): 1000 mg via INTRAVENOUS
  Filled 2017-11-09 (×3): qty 200

## 2017-11-09 MED ORDER — ALBUTEROL SULFATE (2.5 MG/3ML) 0.083% IN NEBU
5.0000 mg | INHALATION_SOLUTION | Freq: Once | RESPIRATORY_TRACT | Status: AC
  Administered 2017-11-09: 5 mg via RESPIRATORY_TRACT
  Filled 2017-11-09: qty 6

## 2017-11-09 MED ORDER — ADULT MULTIVITAMIN W/MINERALS CH
1.0000 | ORAL_TABLET | Freq: Every day | ORAL | Status: DC
  Administered 2017-11-10 – 2017-11-13 (×4): 1 via ORAL
  Filled 2017-11-09 (×4): qty 1

## 2017-11-09 NOTE — Progress Notes (Signed)
Pharmacy Antibiotic Note  Wayne Chapman is a 62 y.o. male admitted on 11/09/2017 with PNA.  Pharmacy has been consulted for Zosyn and vancomycin dosing.  Plan: 1. Zosyn 3.375 gm IV Q8H EI 2. Vancomycin 1 gm IV x 1 followed in approximately 6 hours (stacked dosing) by vancomycin 1 gm IV Q12H, predicted trough 17 mcg/mL. Pharmacy will continue to follow and adjust as needed to maintain trough 15 to 20 mcg/mL.   Vd 42.8 L, Ke 0.075 hr-1, T1/2 9.2 hr  Height: 6\' 1"  (185.4 cm) Weight: 135 lb (61.2 kg) IBW/kg (Calculated) : 79.9  Temp (24hrs), Avg:98.5 F (36.9 C), Min:98.5 F (36.9 C), Max:98.5 F (36.9 C)  Recent Labs  Lab 11/09/17 1101 11/09/17 1248  WBC 6.7  --   CREATININE 0.70  --   LATICACIDVEN  --  1.2    Estimated Creatinine Clearance: 85 mL/min (by C-G formula based on SCr of 0.7 mg/dL).    No Known Allergies  Antimicrobials this admission:   Dose adjustments this admission:   Microbiology results:  BCx:   UCx:    Sputum:    MRSA PCR:   Thank you for allowing pharmacy to be a part of this patient's care.  Laural Benes, Pharm.D., BCPS Clinical Pharmacist 11/09/2017 3:45 PM

## 2017-11-09 NOTE — Consult Note (Signed)
Pulmonary Critical Care  Initial Consult Note  Wayne Chapman:[id number] DOB: 07-10-1955 DOA: 11/09/2017  Referring physician: Dr salary  Chief Complaint: hemoptysis  HPI: Wayne Chapman is a 62 y.o. male with a known history of alcohol abuse and prior aspiration pneumonia presents to the hospital with coughing up blood.  Patient also has been having blood in his stool apparently.  Patient had been last admitted in February with similar complaints.  He was discharged with antibiotics.  Now presents with similar symptoms.  Had a binge of alcohol recently and the last drink was about 2 days ago.  CT scan of the chest was done which showed density in the right apical area suspicious for a mycetoma or possible neoplasm.  In addition patient has narrowing of the right upper lobe bronchus and bronchus intermedius which was seen previously.  I do not know for sure if he was bronched at that time.  Review of Systems:  Constitutional:  + weight loss, night sweats, Fevers, chills, +fatigue.  HEENT:  No headaches, nasal congestion, post nasal drip,  Cardio-vascular:  No chest pain  GI:  No heartburn, indigestion, abdominal pain, nausea, vomiting, diarrhea  Resp:  +shortness of breath. +coughing up of blood.No wheezing Skin:  no rash or lesions.  Musculoskeletal:  No joint pain or swelling.   Remainder ROS performed and is unremarkable other than noted in HPI  Past Medical History:  Diagnosis Date  . Anxiety   . Depression    Past Surgical History:  Procedure Laterality Date  . none     Social History:  reports that he has been smoking cigarettes.  He has been smoking about 1.00 pack per day. He has never used smokeless tobacco. He reports that he drinks alcohol. He reports that he has current or past drug history.  No Known Allergies  Family History  Problem Relation Age of Onset  . Diabetes Mellitus II Mother     Prior to Admission medications   Not on File   Physical  Exam: Vitals:   11/09/17 1515 11/09/17 1530 11/09/17 1545 11/09/17 1639  BP: 116/90 124/80 112/89 102/64  Pulse: 75 75 70 71  Resp:  18  20  Temp:    97.9 F (36.6 C)  TempSrc:    Oral  SpO2: 96% 92% 100% 99%  Weight:    133 lb (60.3 kg)  Height:    6' 1.5" (1.867 m)    Wt Readings from Last 3 Encounters:  11/09/17 133 lb (60.3 kg)  08/12/17 144 lb 8 oz (65.5 kg)    General:  Appears calm and comfortable Eyes: PERRL, normal lids, irises & conjunctiva ENT: grossly normal hearing, lips & tongue Neck: no LAD, masses or thyromegaly Cardiovascular: RRR, no m/r/g. No LE edema. Respiratory: CTA bilaterally, no w/r/r.       Normal respiratory effort. Abdomen: soft, nontender Skin: no rash or induration seen on limited exam Musculoskeletal: grossly normal tone BUE/BLE Psychiatric: grossly normal mood and affect Neurologic: grossly non-focal.          Labs on Admission:  Basic Metabolic Panel: Recent Labs  Lab 11/09/17 1101  NA 128*  K 4.2  CL 89*  CO2 28  GLUCOSE 110*  BUN 11  CREATININE 0.70  CALCIUM 9.5   Liver Function Tests: No results for input(s): AST, ALT, ALKPHOS, BILITOT, PROT, ALBUMIN in the last 168 hours. No results for input(s): LIPASE, AMYLASE in the last 168 hours. No results for input(s): AMMONIA in  the last 168 hours. CBC: Recent Labs  Lab 11/09/17 1101  WBC 6.7  HGB 14.3  HCT 44.1  MCV 77.5*  PLT 194   Cardiac Enzymes: No results for input(s): CKTOTAL, CKMB, CKMBINDEX, TROPONINI in the last 168 hours.  BNP (last 3 results) No results for input(s): BNP in the last 8760 hours.  ProBNP (last 3 results) No results for input(s): PROBNP in the last 8760 hours.  CBG: No results for input(s): GLUCAP in the last 168 hours.  Radiological Exams on Admission: Dg Chest 2 View  Result Date: 11/09/2017 CLINICAL DATA:  Hemoptysis 2 days ago. Also dark blood in stools. Diagnosed with pneumonia 3 months ago. Current smoker. History of necrotizing  pneumonia and malnutrition. EXAM: CHEST - 2 VIEW COMPARISON:  CT scan of the chest of August 09, 2017 and PA and lateral chest x-ray of the same day. FINDINGS: There is persistent abnormal heterogeneous opacity in the right upper lobe. There is pleural thickening. The appearance of the abnormality is somewhat less prominent today. Elsewhere the right lung is clear as is the left lung. The heart and pulmonary vascularity are normal. The bony thorax exhibits no acute abnormality. IMPRESSION: Slightly decreased conspicuity of consolidation in the right upper lobe superiorly. This may reflect incomplete clearing of the suspected necrotizing pneumonia. However, atypical infection or malignancy are not excluded. Repeat chest CT scanning is recommended. Electronically Signed   By: David  Martinique M.D.   On: 11/09/2017 12:35   Ct Chest W Contrast  Result Date: 11/09/2017 CLINICAL DATA:  Hemoptysis. Non resolving right upper lobe opacity on recent chest radiograph. EXAM: CT CHEST WITH CONTRAST TECHNIQUE: Multidetector CT imaging of the chest was performed during intravenous contrast administration. CONTRAST:  51mL ISOVUE-370 IOPAMIDOL (ISOVUE-370) INJECTION 76% COMPARISON:  Chest radiograph on 11/09/2017, and chest CT on 08/09/2017 FINDINGS: Cardiovascular: No acute findings. Aortic and coronary artery atherosclerosis. Mediastinum/Nodes: Mediastinal lymphadenopathy is again seen throughout the right paratracheal, pretracheal, and subcarinal regions, and to lesser degree the AP window. Largest area in the precarinal region measures 2.6 cm on image 50/2, without significant change compared to prior study. Mild right hilar lymphadenopathy shows no significant change, with soft tissue density encasing the right mainstem bronchus and causing stenosis of the central right upper lobe bronchus. Lungs/Pleura: Persistent right upper lobe atelectasis is seen, with central bronchiectasis and larger cystic areas in the right lung  apex. A rounded opacity is seen within 1 of these cystic areas in the medial right lung apex measuring 2.3 x 3.3 cm on image 26/2. This is increased in size compared to previous study and is suspicious for mycetoma, although neoplasm cannot definitely be excluded. Previously seen area of consolidation in the superior and medial aspect of the right lower lobe has resolved since previous study. New peribronchovascular nodular opacities are seen throughout the right lower lobe, most consistent with inflammatory or infectious etiology. No evidence of pleural effusion. Left lung remains clear. Upper Abdomen:  Unremarkable. Musculoskeletal:  No suspicious bone lesions. IMPRESSION: Persistent right upper lobe atelectasis with bronchiectasis and cystic lucencies. Increased size of 3 cm nodular density within right apical cystic lucency is suspicious for mycetoma, although neoplasm cannot definitely be excluded. Mediastinal and right hilar lymphadenopathy without significant change. Narrowing of central right upper lobe bronchus and bronchus intermedius again seen. Malignancy cannot be excluded. Consider bronchoscopy and EBUS for further evaluation. New peribronchovascular nodular opacities in right lower lobe, consistent with inflammatory or infectious etiology. No evidence of pleural effusion. Aortic Atherosclerosis (ICD10-I70.0).  Coronary artery calcification. Electronically Signed   By: Earle Gell M.D.   On: 11/09/2017 14:57    EKG: Independently reviewed.  Assessment/Plan Active Problems:   Hemoptysis   1. Hemoptysis at this time patient is going to be continued on antibiotics which she is been started on and agree with the current selection.  He has been started on Zosyn and vancomycin.  Currently is on droplet precautions and once the isolation is lifted with then consider doing bronchoscopy on him. 2. 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 Weight loss this  again raises concern that he may actually have an underlying malignancy  Code Status: Full code  Family Communication: None Disposition Plan: Home   I have personally obtained a history, examined the patient, evaluated laboratory and imaging results, formulated the assessment and plan and placed orders.  The Patient requires high complexity decision making for assessment and support. Total Time Spent 27min   Azalie Harbeck A Laurene Melendrez, MD [facility] Pulmonary Critical Care Medicine Sleep Medicine

## 2017-11-09 NOTE — Progress Notes (Signed)
Family Meeting Note  Advance Directive:yes  Today a meeting took place with the Patient.  Patient is able to participate in the conversation The following clinical team members were present during this meeting:MD  The following were discussed:Patient's diagnosis: Hemoptysis from necrotizing pneumonia, failure to thrive with significant weight loss,, hyponatremia, alcohol abuse, tobacco abuse, treatment plan of care discussed in detail with the patient.  He verbalized understanding of the plan  patient's progosis: Unable to determine and Goals for treatment: Full Code, wife is the healthcare POA  Additional follow-up to be provided: Hospitalist, infectious disease and pulmonology  Time spent during discussion:17 MIN  Nicholes Mango, MD

## 2017-11-09 NOTE — ED Notes (Signed)
"  stool comes out black."

## 2017-11-09 NOTE — ED Provider Notes (Signed)
[facility] Emergency Department Provider Note  ____________________________________________  Time seen: Approximately 1:23 PM  I have reviewed the triage vital signs and the nursing notes.   HISTORY  Chief Complaint Cough and Blood In Stools   HPI Wayne Chapman is a 62 y.o. male with a history of smoking, alcohol abuse, and prior necrotizing pneumonia February 2019 who presents for evaluation of hemoptysis and shortness of breath.  Patient reports 3 to 4 days of cough that is productive initially of yellow phlegm and now bringing up blood.  He reports several episodes of bringing up large blood clots.  Has had mild constant shortness of breath.  He is also complaining of intermittent subjective fever.  Last alcohol use was 2 days ago.  He smokes half a pack a day.  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  He was to be negative in February 2019.  Patient also noted black stools over the last few days.  Is not on blood thinners.  No prior history of GI bleed.  Patient denies chest pain, nausea, vomiting.  Patient endorses significant weight loss since February however is unable to tell me how many pounds.  Past Medical History:  Diagnosis Date  . Anxiety   . Depression     Patient Active Problem List   Diagnosis Date Noted  . Necrotizing pneumonia ([place]) 08/11/2017  . Hemoptysis 08/09/2017  . Protein-calorie malnutrition, severe 08/09/2017    Past Surgical History:  Procedure Laterality Date  . none      Prior to Admission medications   Not on File    Allergies Patient has no known allergies.  Family History  Problem Relation Age of Onset  . Diabetes Mellitus II Mother     Social History Social History   Tobacco Use  . Smoking status: Current Every Day Smoker    Packs/day: 1.00    Types: Cigarettes  . Smokeless tobacco: Never Used  Substance Use Topics  . Alcohol use: Yes    Comment: alot   . Drug use: Yes     Review of Systems  Constitutional: + fever, unintentional weight loss Eyes: Negative for visual changes. ENT: Negative for sore throat. Neck: No neck pain  Cardiovascular: Negative for chest pain. Respiratory: + shortness of breath, hemoptysis Gastrointestinal: Negative for abdominal pain, vomiting or diarrhea. + black stool Genitourinary: Negative for dysuria. Musculoskeletal: Negative for back pain. Skin: Negative for rash. Neurological: Negative for headaches, weakness or numbness. Psych: No SI or HI  ____________________________________________   PHYSICAL EXAM:  VITAL SIGNS: ED Triage Vitals  Enc Vitals Group     BP 11/09/17 1126 98/73     Pulse Rate 11/09/17 1126 78     Resp 11/09/17 1126 18     Temp 11/09/17 1126 98.5 F (36.9 C)     Temp Source 11/09/17 1126 Oral     SpO2 11/09/17 1126 99 %     Weight 11/09/17 1126 130 lb (59 kg)     Height 11/09/17 1126 6' 1.5" (1.867 m)     Head Circumference --      Peak Flow --      Pain Score 11/09/17 1132 0     Pain Loc --      Pain Edu? --      Excl. in [place]? --     Constitutional: Alert and oriented. Well appearing and in no apparent distress. HEENT:      Head: Normocephalic and  atraumatic.         Eyes: Conjunctivae are normal. Sclera is non-icteric.       Mouth/Throat: Mucous membranes are moist.       Neck: Supple with no signs of meningismus. Cardiovascular: Regular rate and rhythm. No murmurs, gallops, or rubs. 2+ symmetrical distal pulses are present in all extremities. No JVD. Respiratory: Normal respiratory effort. Lungs are clear to auscultation bilaterally with expiratory wheezing.  Gastrointestinal: Soft, non tender, and non distended with positive bowel sounds. No rebound or guarding. Genitourinary: No CVA tenderness.  Rectal exam showing black stool guaiac negative Musculoskeletal: Nontender with normal range of motion in all extremities. No edema, cyanosis, or erythema of extremities. Neurologic:  Normal speech and language. Face is symmetric. Moving all extremities. No gross focal neurologic deficits are appreciated. Skin: Skin is warm, dry and intact. No rash noted. Psychiatric: Mood and affect are normal. Speech and behavior are normal.  ____________________________________________   LABS (all labs ordered are listed, but only abnormal results are displayed)  Labs Reviewed  CBC - Abnormal; Notable for the following components:      Result Value   MCV 77.5 (*)    MCH 25.1 (*)    RDW 15.1 (*)    All other components within normal limits  BASIC METABOLIC PANEL - Abnormal; Notable for the following components:   Sodium 128 (*)    Chloride 89 (*)    Glucose, Bld 110 (*)    All other components within normal limits  CULTURE, BLOOD (ROUTINE X 2)  CULTURE, BLOOD (ROUTINE X 2)  LACTIC ACID, PLASMA  PROTIME-INR  APTT   ____________________________________________  EKG  ED ECG REPORT I, Rudene Re, the attending physician, personally viewed and interpreted this ECG.  Normal sinus rhythm, rate of 79, normal intervals, normal axis, no ST elevations or depressions.  Unchanged from prior from 2/90 ____________________________________________  RADIOLOGY  I have personally reviewed the images performed during this visit and I agree with the Radiologist's read.   Interpretation by Radiologist:  Dg Chest 2 View  Result Date: 11/09/2017 CLINICAL DATA:  Hemoptysis 2 days ago. Also dark blood in stools. Diagnosed with pneumonia 3 months ago. Current smoker. History of necrotizing pneumonia and malnutrition. EXAM: CHEST - 2 VIEW COMPARISON:  CT scan of the chest of August 09, 2017 and PA and lateral chest x-ray of the same day. FINDINGS: There is persistent abnormal heterogeneous opacity in the right upper lobe. There is pleural thickening. The appearance of the abnormality is somewhat less prominent today. Elsewhere the right lung is clear as is the left lung. The heart and  pulmonary vascularity are normal. The bony thorax exhibits no acute abnormality. IMPRESSION: Slightly decreased conspicuity of consolidation in the right upper lobe superiorly. This may reflect incomplete clearing of the suspected necrotizing pneumonia. However, atypical infection or malignancy are not excluded. Repeat chest CT scanning is recommended. Electronically Signed   By: David  Martinique M.D.   On: 11/09/2017 12:35   Ct Chest W Contrast  Result Date: 11/09/2017 CLINICAL DATA:  Hemoptysis. Non resolving right upper lobe opacity on recent chest radiograph. EXAM: CT CHEST WITH CONTRAST TECHNIQUE: Multidetector CT imaging of the chest was performed during intravenous contrast administration. CONTRAST:  13mL ISOVUE-370 IOPAMIDOL (ISOVUE-370) INJECTION 76% COMPARISON:  Chest radiograph on 11/09/2017, and chest CT on 08/09/2017 FINDINGS: Cardiovascular: No acute findings. Aortic and coronary artery atherosclerosis. Mediastinum/Nodes: Mediastinal lymphadenopathy is again seen throughout the right paratracheal, pretracheal, and subcarinal regions, and to lesser degree the  AP window. Largest area in the precarinal region measures 2.6 cm on image 50/2, without significant change compared to prior study. Mild right hilar lymphadenopathy shows no significant change, with soft tissue density encasing the right mainstem bronchus and causing stenosis of the central right upper lobe bronchus. Lungs/Pleura: Persistent right upper lobe atelectasis is seen, with central bronchiectasis and larger cystic areas in the right lung apex. A rounded opacity is seen within 1 of these cystic areas in the medial right lung apex measuring 2.3 x 3.3 cm on image 26/2. This is increased in size compared to previous study and is suspicious for mycetoma, although neoplasm cannot definitely be excluded. Previously seen area of consolidation in the superior and medial aspect of the right lower lobe has resolved since previous study. New  peribronchovascular nodular opacities are seen throughout the right lower lobe, most consistent with inflammatory or infectious etiology. No evidence of pleural effusion. Left lung remains clear. Upper Abdomen:  Unremarkable. Musculoskeletal:  No suspicious bone lesions. IMPRESSION: Persistent right upper lobe atelectasis with bronchiectasis and cystic lucencies. Increased size of 3 cm nodular density within right apical cystic lucency is suspicious for mycetoma, although neoplasm cannot definitely be excluded. Mediastinal and right hilar lymphadenopathy without significant change. Narrowing of central right upper lobe bronchus and bronchus intermedius again seen. Malignancy cannot be excluded. Consider bronchoscopy and EBUS for further evaluation. New peribronchovascular nodular opacities in right lower lobe, consistent with inflammatory or infectious etiology. No evidence of pleural effusion. Aortic Atherosclerosis (ICD10-I70.0). Coronary artery calcification. Electronically Signed   By: Earle Gell M.D.   On: 11/09/2017 14:57     ____________________________________________   PROCEDURES  Procedure(s) performed: None Procedures Critical Care performed:  None ____________________________________________   INITIAL IMPRESSION / ASSESSMENT AND PLAN / ED COURSE  62 y.o. male with a history of smoking, alcohol abuse, and prior necrotizing pneumonia February 2019 who presents for evaluation of hemoptysis and shortness of breath.  Patient looks very thin but in no distress, he does have expiratory wheezes bilaterally, rectal exam shows black stool guaiac negative.  Patient's BP is slightly soft at 98/73 with a normal pulse of 78, he has normal saturations on room air.  Chest x-ray is concerning for right upper lobe consolidation which could be a recurrence of his necrotizing pneumonia versus an atypical infection or malignancy.  We will send patient for CT chest with contrast to better evaluate.  There is  no signs of sepsis at this time.  Labs show normal white count, stable hemoglobin, slight hyponatremia which patient has had in the past most likely due to chronic alcohol abuse. Will start patient on vanc and zosyn for possible necrotizing infection. Will give duoneb. Dispo pending CT    _________________________ 3:07 PM on 11/09/2017 -----------------------------------------  CT concerning for necrotizing infection +/- malignancy. Patient given IV zosyn and vanc and will be admitted to the Hospitalist service.    As part of my medical decision making, I reviewed the following data within the [place] notes reviewed and incorporated, Labs reviewed , EKG interpreted , Radiograph reviewed , Discussed with admitting physician , Notes from prior ED visits and [place] Controlled Substance Database    Pertinent labs & imaging results that were available during my care of the patient were reviewed by me and considered in my medical decision making (see chart for details).    ____________________________________________   FINAL CLINICAL IMPRESSION(S) / ED DIAGNOSES  Final diagnoses:  Necrotizing pneumonia ([place])  NEW MEDICATIONS STARTED DURING THIS VISIT:  ED Discharge Orders    None       Note:  This document was prepared using Dragon voice recognition software and may include unintentional dictation errors.    Alfred Levins, [place], MD 11/09/17 [phone]

## 2017-11-09 NOTE — ED Triage Notes (Signed)
Pt states cough for 3 days with blood in sputum, states chills and fever, appears in nad.

## 2017-11-09 NOTE — ED Notes (Signed)
Pt given meal tray at this time 

## 2017-11-09 NOTE — H&P (Signed)
[facility] [place] NAME: Wayne Chapman    MR#:  [id number]  DATE OF BIRTH:  [birth year]  DATE OF ADMISSION:  11/09/2017  PRIMARY CARE PHYSICIAN: Wayne Chapman, [name]   REQUESTING/REFERRING PHYSICIAN: Rudene Re, MD  CHIEF COMPLAINT:   Coughing of blood and blood in stool HISTORY OF PRESENT ILLNESS:  Wayne Chapman  is a 62 y.o. male with a known history of alcohol abuse and prior history of necrotizing pneumonia in February 2019 who was admitted to the hospital and got treated with antibiotics and was discharged home Wayne Chapman is.  Reporting 3 to 4-day history of cough with yellowish phlegm and then eventually he started spitting up blood also Wayne Chapman has noticed blood in his stool and came into the ED.  Last alcohol intake was 2 days ago.  Wayne Chapman also is reporting that he is smoking half pack a day.  He has lost significant weight of approximately 30 pounds in the past 46months denies any chest pain.  Denies any nausea vomiting.  Stool for occult blood is negative in the ED.Marland Kitchen Wayne Chapman's a TB QuantiFERON test was negative during the previous admission.  PAST MEDICAL HISTORY:   Past Medical History:  Diagnosis Date  . Anxiety   . Depression     PAST SURGICAL HISTOIRY:   Past Surgical History:  Procedure Laterality Date  . none      SOCIAL HISTORY:   Social History   Tobacco Use  . Smoking status: Current Every Day Smoker    Packs/day: 1.00    Types: Cigarettes  . Smokeless tobacco: Never Used  Substance Use Topics  . Alcohol use: Yes    Comment: alot     FAMILY HISTORY:   Family History  Problem Relation Age of Onset  . Diabetes Mellitus II Mother     DRUG ALLERGIES:  No Known Allergies  REVIEW OF SYSTEMS:  CONSTITUTIONAL: No fever, fatigue or weakness.  Reporting significant weight loss but denies any loss of appetite EYES: No blurred or double vision.  EARS, NOSE, AND THROAT: No tinnitus or ear pain.   RESPIRATORY:  reports 4-day history of productive cough with yellowish phlegm and has been noticed blood for the past 2 days , denies shortness of breath, wheezing or hemoptysis.  CARDIOVASCULAR: No chest pain, orthopnea, edema.  GASTROINTESTINAL: No nausea, vomiting, diarrhea or abdominal pain.  GENITOURINARY: No dysuria, hematuria.  ENDOCRINE: No polyuria, nocturia,  HEMATOLOGY: No anemia, easy bruising or bleeding SKIN: No rash or lesion. MUSCULOSKELETAL: No joint pain or arthritis.   NEUROLOGIC: No tingling, numbness, weakness.  PSYCHIATRY: No anxiety or depression.   MEDICATIONS AT HOME:   Prior to Admission medications   Not on File      VITAL SIGNS:  Blood pressure 124/80, pulse 75, temperature 98.5 F (36.9 C), temperature source Oral, resp. rate 18, height 6\' 1"  (1.854 m), weight 61.2 kg (135 lb), SpO2 92 %.  PHYSICAL EXAMINATION:  GENERAL:  62 y.o.-year-old Wayne Chapman lying in the bed with no acute distress.  Looks emaciated EYES: Pupils equal, round, reactive to light and accommodation. No scleral icterus. Extraocular muscles intact.  HEENT: Head atraumatic, normocephalic. Oropharynx and nasopharynx clear.  NECK:  Supple, no jugular venous distention. No thyroid enlargement, no tenderness.  LUNGS: Moderately diminished breath sounds bilaterally, no wheezing, rales,rhonchi or crepitation. No use of accessory muscles of respiration.  CARDIOVASCULAR: S1, S2 normal. No murmurs, rubs, or gallops.  ABDOMEN: Soft, nontender, nondistended. Bowel sounds present.  No organomegaly or mass.  EXTREMITIES: No pedal edema, cyanosis, or clubbing.  NEUROLOGIC: Cranial nerves II through XII are intact. Muscle strength 5/5 in all extremities. Sensation intact. Gait not checked.  PSYCHIATRIC: The Wayne Chapman is alert and oriented x 3.  SKIN: No obvious rash, lesion, or ulcer.   LABORATORY PANEL:   CBC Recent Labs  Lab 11/09/17 1101  WBC 6.7  HGB 14.3  HCT 44.1  PLT 194    ------------------------------------------------------------------------------------------------------------------  Chemistries  Recent Labs  Lab 11/09/17 1101  NA 128*  K 4.2  CL 89*  CO2 28  GLUCOSE 110*  BUN 11  CREATININE 0.70  CALCIUM 9.5   ------------------------------------------------------------------------------------------------------------------  Cardiac Enzymes No results for input(s): TROPONINI in the last 168 hours. ------------------------------------------------------------------------------------------------------------------  RADIOLOGY:  Dg Chest 2 View  Result Date: 11/09/2017 CLINICAL DATA:  Hemoptysis 2 days ago. Also dark blood in stools. Diagnosed with pneumonia 3 months ago. Current smoker. History of necrotizing pneumonia and malnutrition. EXAM: CHEST - 2 VIEW COMPARISON:  CT scan of the chest of August 09, 2017 and PA and lateral chest x-ray of the same day. FINDINGS: There is persistent abnormal heterogeneous opacity in the right upper lobe. There is pleural thickening. The appearance of the abnormality is somewhat less prominent today. Elsewhere the right lung is clear as is the left lung. The heart and pulmonary vascularity are normal. The bony thorax exhibits no acute abnormality. IMPRESSION: Slightly decreased conspicuity of consolidation in the right upper lobe superiorly. This may reflect incomplete clearing of the suspected necrotizing pneumonia. However, atypical infection or malignancy are not excluded. Repeat chest CT scanning is recommended. Electronically Signed   By: David  Martinique M.D.   On: 11/09/2017 12:35   Ct Chest W Contrast  Result Date: 11/09/2017 CLINICAL DATA:  Hemoptysis. Non resolving right upper lobe opacity on recent chest radiograph. EXAM: CT CHEST WITH CONTRAST TECHNIQUE: Multidetector CT imaging of the chest was performed during intravenous contrast administration. CONTRAST:  25mL ISOVUE-370 IOPAMIDOL (ISOVUE-370) INJECTION  76% COMPARISON:  Chest radiograph on 11/09/2017, and chest CT on 08/09/2017 FINDINGS: Cardiovascular: No acute findings. Aortic and coronary artery atherosclerosis. Mediastinum/Nodes: Mediastinal lymphadenopathy is again seen throughout the right paratracheal, pretracheal, and subcarinal regions, and to lesser degree the AP window. Largest area in the precarinal region measures 2.6 cm on image 50/2, without significant change compared to prior study. Mild right hilar lymphadenopathy shows no significant change, with soft tissue density encasing the right mainstem bronchus and causing stenosis of the central right upper lobe bronchus. Lungs/Pleura: Persistent right upper lobe atelectasis is seen, with central bronchiectasis and larger cystic areas in the right lung apex. A rounded opacity is seen within 1 of these cystic areas in the medial right lung apex measuring 2.3 x 3.3 cm on image 26/2. This is increased in size compared to previous study and is suspicious for mycetoma, although neoplasm cannot definitely be excluded. Previously seen area of consolidation in the superior and medial aspect of the right lower lobe has resolved since previous study. New peribronchovascular nodular opacities are seen throughout the right lower lobe, most consistent with inflammatory or infectious etiology. No evidence of pleural effusion. Left lung remains clear. Upper Abdomen:  Unremarkable. Musculoskeletal:  No suspicious bone lesions. IMPRESSION: Persistent right upper lobe atelectasis with bronchiectasis and cystic lucencies. Increased size of 3 cm nodular density within right apical cystic lucency is suspicious for mycetoma, although neoplasm cannot definitely be excluded. Mediastinal and right hilar lymphadenopathy without significant change. Narrowing of central right  upper lobe bronchus and bronchus intermedius again seen. Malignancy cannot be excluded. Consider bronchoscopy and EBUS for further evaluation. New  peribronchovascular nodular opacities in right lower lobe, consistent with inflammatory or infectious etiology. No evidence of pleural effusion. Aortic Atherosclerosis (ICD10-I70.0). Coronary artery calcification. Electronically Signed   By: Earle Gell M.D.   On: 11/09/2017 14:57    EKG:   Orders placed or performed during the hospital encounter of 11/09/17  . ED EKG  . ED EKG    IMPRESSION AND PLAN:   Gifford Ballon  is a 62 y.o. male with a known history of alcohol abuse and prior history of necrotizing pneumonia in February 2019 who was admitted to the hospital and got treated with antibiotics and was discharged home Wayne Chapman is.  Reporting 3 to 4-day history of cough with yellowish phlegm and then eventually he started spitting up blood also Wayne Chapman has noticed blood in his stool and came into the ED.  #Hemoptysis probably from necrotizing pneumonia  with a component of aspiration as well, cannot rule out underlying malignancy Admit to MedSurg unit  sputum culture and sensitivity ordered Respiratory virus panel ordered Broad-spectrum IV antibiotics Zosyn and vancomycin Infectious diseases consulted regarding necrotizing pneumonia versus  Mycetoma Pulmonology consult is placed for possible bronchoscopy given the history of recurrent hemoptysis and significant weight loss.  CT scan has revealed multiple cystic lucencies which could be mycetoma versus underlying malignancy Bronchodilator treatments as needed Blood cultures ordered  #Hyponatremia could be from dehydration/SIADH from the alcohol abuse versus possible underlying malignancy Will provide gentle hydration with IV fluids at this time and monitor serial sodium levels Check urine sodium and lites, check urine osmolality   #Failure to thrive significant weight loss of 30 pounds in the past  3 months  Possibly management from poor p.o. intake as Wayne Chapman has alcohol abuse Could not rule out underlying malignancy at this time Dietary  consult  #Alcohol abuse CIWA protocol banana bag Wayne Chapman will be benefited with outpatient alcohol Anonymous, he is agreeable  #Tobacco abuse disorder Counseled Wayne Chapman to quit smoking for 4 minutes, Wayne Chapman is agreeable and will place him on nicotine patch   DVT prophylaxis with SCDs given patients hemoptysis    All the records are reviewed and case discussed with ED provider. Management plans discussed with the Wayne Chapman, family and they are in agreement.  CODE STATUS: FC   TOTAL TIME TAKING CARE OF THIS Wayne Chapman: 42  minutes.   Note: This dictation was prepared with Dragon dictation along with smaller phrase technology. Any transcriptional errors that result from this process are unintentional.  Nicholes Mango M.D on 11/09/2017 at 3:38 PM  Between 7am to 6pm - Pager - [phone]  After 6pm go to [email or web address] - password [email or web address] [facility]  Office  [phone]  CC: Primary care physician; Wayne Chapman, [name]

## 2017-11-09 NOTE — ED Notes (Signed)
First Nurse Note:  Patient to ED via [facility] from home with complaint of coughing up blood 2 days ago and dark blood in stool.  Patient told EMS that this happened in Feb. And he was diagnosed with pneumonia.  Hx of mental health issues per EMS.  Alert and oriented, sitting in WC.  BP 111/69, HR 70, Ox sat 97% per EMS.

## 2017-11-10 LAB — EXPECTORATED SPUTUM ASSESSMENT W GRAM STAIN, RFLX TO RESP C: Special Requests: NORMAL

## 2017-11-10 LAB — EXPECTORATED SPUTUM ASSESSMENT W REFEX TO RESP CULTURE

## 2017-11-10 LAB — RESPIRATORY PANEL BY PCR
Adenovirus: NOT DETECTED
BORDETELLA PERTUSSIS-RVPCR: NOT DETECTED
CORONAVIRUS 229E-RVPPCR: NOT DETECTED
CORONAVIRUS HKU1-RVPPCR: NOT DETECTED
CORONAVIRUS OC43-RVPPCR: NOT DETECTED
Chlamydophila pneumoniae: NOT DETECTED
Coronavirus NL63: NOT DETECTED
Influenza A: NOT DETECTED
Influenza B: NOT DETECTED
Metapneumovirus: NOT DETECTED
Mycoplasma pneumoniae: NOT DETECTED
PARAINFLUENZA VIRUS 1-RVPPCR: NOT DETECTED
PARAINFLUENZA VIRUS 2-RVPPCR: NOT DETECTED
Parainfluenza Virus 3: NOT DETECTED
Parainfluenza Virus 4: NOT DETECTED
Respiratory Syncytial Virus: NOT DETECTED
Rhinovirus / Enterovirus: NOT DETECTED

## 2017-11-10 LAB — PROCALCITONIN: PROCALCITONIN: 0.1 ng/mL

## 2017-11-10 LAB — HIV ANTIBODY (ROUTINE TESTING W REFLEX): HIV SCREEN 4TH GENERATION: NONREACTIVE

## 2017-11-10 MED ORDER — LORAZEPAM 1 MG PO TABS: 1.0000 mg | ORAL_TABLET | Freq: Four times a day (QID) | ORAL | Status: AC | PRN

## 2017-11-10 MED ORDER — ENSURE ENLIVE PO LIQD: 237.0000 mL | Freq: Three times a day (TID) | ORAL | Status: DC

## 2017-11-10 MED ORDER — LORAZEPAM 2 MG/ML IJ SOLN: 1.0000 mg | Freq: Four times a day (QID) | INTRAMUSCULAR | Status: AC | PRN

## 2017-11-10 MED ORDER — SODIUM CHLORIDE 0.9 % IV SOLN
INTRAVENOUS | Status: DC
  Administered 2017-11-10: 15:00:00 via INTRAVENOUS

## 2017-11-10 MED ORDER — LORAZEPAM 2 MG/ML IJ SOLN: 0.0000 mg | Freq: Four times a day (QID) | INTRAMUSCULAR | Status: AC

## 2017-11-10 MED ORDER — LORAZEPAM 2 MG/ML IJ SOLN: 0.0000 mg | Freq: Two times a day (BID) | INTRAMUSCULAR | Status: DC

## 2017-11-10 NOTE — Consult Note (Signed)
[facility] Infectious Disease     Reason for Consult: Necrotizing PNA    Referring Physician: Governor Specking Date of Admission:  11/09/2017   Active Problems:   Hemoptysis   HPI: Wayne Chapman is a 62 y.o. male known history of alcohol abuse and prior history of necrotizing pneumonia in February 2019 who was admitted to the hospital and got treated with antibiotics and was discharged home patient is.  Reporting 3 to 4-day history of cough with yellowish phlegm and then eventually he started spitting up blood also patient has noticed blood in his stool and came into the ED.  Last alcohol intake was 2 days ago.  Patient also is reporting that he is smoking half pack a day.  He has lost significant weight of approximately 30 pounds in the past 87month. On admit wbc 8, low grade fevers. CT shows RUL infiltrate and 3 cm mass like lesion . BRonch showed A partially obstructing mass was found proximally, at the orifice in the right mainstem bronchus. The mass was medium-sized and circumferential. The lesion was successfully traversed. Endobronchial biopsies were performed in the right mainstem bronchus using forceps and sent for histopathology examination. Two samples were obtained. Washings were obtained in the right mainstem bronchus and sent for cell count, bacterial culture, viral smears & culture, and fungal & AFB analysis and cytology. The return was bloody.   Past Medical History:  Diagnosis Date  . Anxiety   . Depression    Past Surgical History:  Procedure Laterality Date  . none     Social History   Tobacco Use  . Smoking status: Current Every Day Smoker    Packs/day: 1.00    Types: Cigarettes  . Smokeless tobacco: Never Used  Substance Use Topics  . Alcohol use: Yes    Comment: alot   . Drug use: Yes   Family History  Problem Relation Age of Onset  . Diabetes Mellitus II Mother     Allergies: No Known Allergies  Current antibiotics: Antibiotics Given (last 72  hours)    Date/Time Action Medication Dose Rate   11/09/17 1316 New Bag/Given   piperacillin-tazobactam (ZOSYN) IVPB 3.375 g 3.375 g 100 mL/hr   11/09/17 1431 New Bag/Given   vancomycin (VANCOCIN) IVPB 1000 mg/200 mL premix 1,000 mg 200 mL/hr   11/09/17 2129 New Bag/Given   vancomycin (VANCOCIN) IVPB 1000 mg/200 mL premix 1,000 mg 200 mL/hr   11/09/17 2130 New Bag/Given   piperacillin-tazobactam (ZOSYN) IVPB 3.375 g 3.375 g 12.5 mL/hr   11/10/17 0523 New Bag/Given   piperacillin-tazobactam (ZOSYN) IVPB 3.375 g 3.375 g 12.5 mL/hr      MEDICATIONS: . folic acid  1 mg Oral Daily  . LORazepam  0-4 mg Intravenous Q6H   Followed by  . [START ON 11/12/2017] LORazepam  0-4 mg Intravenous Q12H  . multivitamin with minerals  1 tablet Oral Daily  . nicotine  14 mg Transdermal Daily  . thiamine  100 mg Oral Daily   Or  . thiamine  100 mg Intravenous Daily    Review of Systems - 11 systems reviewed and negative per HPI   OBJECTIVE: Temp:  [97.9 F (36.6 C)-100.2 F (37.9 C)] 98.7 F (37.1 C) (05/16 0349) Pulse Rate:  [63-78] 63 (05/16 0349) Resp:  [18-20] 18 (05/16 0349) BP: (95-124)/(61-90) 97/61 (05/16 0349) SpO2:  [92 %-100 %] 97 % (05/16 0349) Weight:  [59 kg (130 lb)-61.2 kg (135 lb)] 60.3 kg (133 lb) (05/15 1639) Physical Exam  Constitutional: He is oriented to person, place, and time. Frail, think HENT: anicteric Mouth/Throat: Oropharynx  Poor dentition  Cardiovascular: Normal rate, regular rhythm and normal heart sounds.  Pulmonary/Chest: poor air movement dec bs R  Abdominal: Soft. Bowel sounds are normal. He exhibits no distension. There is no tenderness.  Lymphadenopathy:  He has no cervical adenopathy.  Neurological: He is alert and oriented to person, place, and time.  Skin: Skin is warm and dry. No rash noted. No erythema.  Psychiatric: He has a normal mood and affect. His behavior is normal.     LABS: Results for orders placed or performed during the hospital  encounter of 11/09/17 (from the past 48 hour(s))  Culture, expectorated sputum-assessment     Status: None   Collection Time: 11/09/17  1:00 AM  Result Value Ref Range   Specimen Description SPUTUM    Special Requests Normal    Sputum evaluation      THIS SPECIMEN IS ACCEPTABLE FOR SPUTUM CULTURE Performed at [facility], [address], [place], [place] [address]    Report Status 11/10/2017 FINAL   Culture, respiratory (NON-Expectorated)     Status: None (Preliminary result)   Collection Time: 11/09/17  1:00 AM  Result Value Ref Range   Specimen Description      SPUTUM Performed at [facility], [address] [place], [place] [address]    Special Requests      Normal Reflexed from [id number] Performed at [facility], [place], [place] [address]    Gram Stain      FEW WBC PRESENT, PREDOMINANTLY PMN RARE GRAM POSITIVE COCCI IN PAIRS RARE GRAM NEGATIVE RODS Performed at [facility], [place] [address] [place], [place] [address]    Culture PENDING    Report Status PENDING   CBC     Status: Abnormal   Collection Time: 11/09/17 11:01 AM  Result Value Ref Range   WBC 6.7 3.8 - 10.6 K/uL   RBC 5.68 4.40 - 5.90 MIL/uL   Hemoglobin 14.3 13.0 - 18.0 g/dL   HCT 44.1 40.0 - 52.0 %   MCV 77.5 (L) 80.0 - 100.0 fL   MCH 25.1 (L) 26.0 - 34.0 pg   MCHC 32.4 32.0 - 36.0 g/dL   RDW 15.1 (H) 11.5 - 14.5 %   Platelets 194 150 - 440 K/uL    Comment: Performed at [facility], [address] [place], [place] [address]  Basic metabolic panel     Status: Abnormal   Collection Time: 11/09/17 11:01 AM  Result Value Ref Range   Sodium 128 (L) 135 - 145 mmol/L   Potassium 4.2 3.5 - 5.1 mmol/L   Chloride 89 (L) 101 - 111 mmol/L   CO2 28 22 - 32 mmol/L   Glucose, Bld 110 (H) 65 - 99 mg/dL   BUN 11 6 - 20 mg/dL   Creatinine, Ser 0.70 0.61 - 1.24 mg/dL   Calcium 9.5 8.9 - 10.3 mg/dL   GFR calc non Af Amer >60 >60 mL/min   GFR calc Af  Amer >60 >60 mL/min    Comment: (NOTE) The eGFR has been calculated using the CKD EPI equation. This calculation has not been validated in all clinical situations. eGFR's persistently <60 mL/min signify possible Chronic Kidney Disease.    Anion gap 11 5 - 15    Comment: Performed at [facility], [address] [place], [place] [address]  Blood culture (routine x 2)  Status: None (Preliminary result)   Collection Time: 11/09/17 12:48 PM  Result Value Ref Range   Specimen Description BLOOD LEFT ANTECUBITAL    Special Requests      BOTTLES DRAWN AEROBIC AND ANAEROBIC Blood Culture results may not be optimal due to an inadequate volume of blood received in culture bottles   Culture      NO GROWTH < 24 HOURS Performed at [facility], [address], [place], [place] [address]    Report Status PENDING   Lactic acid, plasma     Status: None   Collection Time: 11/09/17 12:48 PM  Result Value Ref Range   Lactic Acid, Venous 1.2 0.5 - 1.9 mmol/L    Comment: Performed at [facility], [address] [place], [place] [address]  Blood culture (routine x 2)     Status: None (Preliminary result)   Collection Time: 11/09/17 12:53 PM  Result Value Ref Range   Specimen Description BLOOD BLOOD LEFT ARM    Special Requests      BOTTLES DRAWN AEROBIC AND ANAEROBIC Blood Culture adequate volume   Culture      NO GROWTH < 24 HOURS Performed at [facility], [address], [place], [place] [address]    Report Status PENDING   Protime-INR     Status: None   Collection Time: 11/09/17 12:53 PM  Result Value Ref Range   Prothrombin Time 13.8 11.4 - 15.2 seconds   INR 1.07     Comment: Performed at [facility], [address] [place], [place] [address]  APTT     Status: None   Collection Time: 11/09/17 12:53 PM  Result Value Ref Range   aPTT 34 24 - 36 seconds    Comment: Performed at [facility], [address]  [place], [place] [address]  MRSA PCR Screening     Status: None   Collection Time: 11/09/17  4:45 PM  Result Value Ref Range   MRSA by PCR NEGATIVE NEGATIVE    Comment:        The GeneXpert MRSA Assay (FDA approved for NASAL specimens only), is one component of a comprehensive MRSA colonization surveillance program. It is not intended to diagnose MRSA infection nor to guide or monitor treatment for MRSA infections. Performed at [facility], [address] [place], [place] [address]   Respiratory Panel by PCR     Status: None   Collection Time: 11/09/17  4:45 PM  Result Value Ref Range   Adenovirus NOT DETECTED NOT DETECTED   Coronavirus 229E NOT DETECTED NOT DETECTED   Coronavirus HKU1 NOT DETECTED NOT DETECTED   Coronavirus NL63 NOT DETECTED NOT DETECTED   Coronavirus OC43 NOT DETECTED NOT DETECTED   Metapneumovirus NOT DETECTED NOT DETECTED   Rhinovirus / Enterovirus NOT DETECTED NOT DETECTED   Influenza A NOT DETECTED NOT DETECTED   Influenza B NOT DETECTED NOT DETECTED   Parainfluenza Virus 1 NOT DETECTED NOT DETECTED   Parainfluenza Virus 2 NOT DETECTED NOT DETECTED   Parainfluenza Virus 3 NOT DETECTED NOT DETECTED   Parainfluenza Virus 4 NOT DETECTED NOT DETECTED   Respiratory Syncytial Virus NOT DETECTED NOT DETECTED   Bordetella pertussis NOT DETECTED NOT DETECTED   Chlamydophila pneumoniae NOT DETECTED NOT DETECTED   Mycoplasma pneumoniae NOT DETECTED NOT DETECTED    Comment: Performed at [facility], [place] [address], [place], [place] [address]  Procalcitonin - Baseline     Status: None  Collection Time: 11/09/17  5:20 PM  Result Value Ref Range   Procalcitonin <0.10 ng/mL    Comment:        Interpretation: PCT (Procalcitonin) <= 0.5 ng/mL: Systemic infection (sepsis) is not likely. Local bacterial infection is possible. (NOTE)       Sepsis PCT Algorithm           Lower Respiratory Tract                                      Infection PCT  Algorithm    ----------------------------     ----------------------------         PCT < 0.25 ng/mL                PCT < 0.10 ng/mL         Strongly encourage             Strongly discourage   discontinuation of antibiotics    initiation of antibiotics    ----------------------------     -----------------------------       PCT 0.25 - 0.50 ng/mL            PCT 0.10 - 0.25 ng/mL               OR       >80% decrease in PCT            Discourage initiation of                                            antibiotics      Encourage discontinuation           of antibiotics    ----------------------------     -----------------------------         PCT >= 0.50 ng/mL              PCT 0.26 - 0.50 ng/mL               AND        <80% decrease in PCT             Encourage initiation of                                             antibiotics       Encourage continuation           of antibiotics    ----------------------------     -----------------------------        PCT >= 0.50 ng/mL                  PCT > 0.50 ng/mL               AND         increase in PCT                  Strongly encourage                                      initiation of antibiotics    Strongly encourage escalation  of antibiotics                                     -----------------------------                                           PCT <= 0.25 ng/mL                                                 OR                                        > 80% decrease in PCT                                     Discontinue / Do not initiate                                             antibiotics Performed at [facility], [address] [place], [place] [address]   Strep pneumoniae urinary antigen     Status: None   Collection Time: 11/09/17  9:56 PM  Result Value Ref Range   Strep Pneumo Urinary Antigen NEGATIVE NEGATIVE    Comment:        Infection due to S. pneumoniae cannot be absolutely ruled out since the  antigen present may be below the detection limit of the test. Performed at [facility], [place] [address], [place], [place] [address]   Osmolality, urine     Status: Abnormal   Collection Time: 11/09/17  9:56 PM  Result Value Ref Range   Osmolality, Ur 231 (L) 300 - 900 mOsm/kg    Comment: Performed at [facility], [address] [place], [place] [address]  Chloride, urine, random     Status: None   Collection Time: 11/09/17  9:56 PM  Result Value Ref Range   Chloride Urine 18 mmol/L    Comment: Performed at [facility], [address] [place], [place] [address]  Sodium, urine, random     Status: None   Collection Time: 11/09/17  9:57 PM  Result Value Ref Range   Sodium, Ur 38 mmol/L    Comment: Performed at [facility], [address] [place], [place] [address]  Procalcitonin     Status: None   Collection Time: 11/10/17  3:55 AM  Result Value Ref Range   Procalcitonin 0.10 ng/mL    Comment:        Interpretation: PCT (Procalcitonin) <= 0.5 ng/mL: Systemic infection (sepsis) is not likely. Local bacterial infection is possible. (NOTE)       Sepsis PCT Algorithm           Lower Respiratory Tract  Infection PCT Algorithm    ----------------------------     ----------------------------         PCT < 0.25 ng/mL                PCT < 0.10 ng/mL         Strongly encourage             Strongly discourage   discontinuation of antibiotics    initiation of antibiotics    ----------------------------     -----------------------------       PCT 0.25 - 0.50 ng/mL            PCT 0.10 - 0.25 ng/mL               OR       >80% decrease in PCT            Discourage initiation of                                            antibiotics      Encourage discontinuation           of antibiotics    ----------------------------     -----------------------------         PCT >= 0.50 ng/mL              PCT 0.26 - 0.50 ng/mL                AND        <80% decrease in PCT             Encourage initiation of                                             antibiotics       Encourage continuation           of antibiotics    ----------------------------     -----------------------------        PCT >= 0.50 ng/mL                  PCT > 0.50 ng/mL               AND         increase in PCT                  Strongly encourage                                      initiation of antibiotics    Strongly encourage escalation           of antibiotics                                     -----------------------------                                           PCT <= 0.25 ng/mL  OR                                        > 80% decrease in PCT                                     Discontinue / Do not initiate                                             antibiotics Performed at [facility], [address] [place], [place] [address]    No components found for: ESR, C REACTIVE PROTEIN MICRO: Recent Results (from the past 720 hour(s))  Culture, expectorated sputum-assessment     Status: None   Collection Time: 11/09/17  1:00 AM  Result Value Ref Range Status   Specimen Description SPUTUM  Final   Special Requests Normal  Final   Sputum evaluation   Final    THIS SPECIMEN IS ACCEPTABLE FOR SPUTUM CULTURE Performed at [facility], [address], [place], [place] [address]    Report Status 11/10/2017 FINAL  Final  Culture, respiratory (NON-Expectorated)     Status: None (Preliminary result)   Collection Time: 11/09/17  1:00 AM  Result Value Ref Range Status   Specimen Description   Final    SPUTUM Performed at [facility], [address], [place], [place] [address]    Special Requests   Final    Normal Reflexed from [phone] Performed at [facility], [address] [place], [place] [address]    Gram Stain   Final    FEW WBC PRESENT,  PREDOMINANTLY PMN RARE GRAM POSITIVE COCCI IN PAIRS RARE GRAM NEGATIVE RODS Performed at [facility], [place] [address], [place], [place] [address]    Culture PENDING  Incomplete   Report Status PENDING  Incomplete  Blood culture (routine x 2)     Status: None (Preliminary result)   Collection Time: 11/09/17 12:48 PM  Result Value Ref Range Status   Specimen Description BLOOD LEFT ANTECUBITAL  Final   Special Requests   Final    BOTTLES DRAWN AEROBIC AND ANAEROBIC Blood Culture results may not be optimal due to an inadequate volume of blood received in culture bottles   Culture   Final    NO GROWTH < 24 HOURS Performed at [facility], [address] [place], [place] [address]    Report Status PENDING  Incomplete  Blood culture (routine x 2)     Status: None (Preliminary result)   Collection Time: 11/09/17 12:53 PM  Result Value Ref Range Status   Specimen Description BLOOD BLOOD LEFT ARM  Final   Special Requests   Final    BOTTLES DRAWN AEROBIC AND ANAEROBIC Blood Culture adequate volume   Culture   Final    NO GROWTH < 24 HOURS Performed at [facility], [address] [place], [place] [address]    Report Status PENDING  Incomplete  MRSA PCR Screening     Status: None   Collection Time: 11/09/17  4:45 PM  Result Value Ref Range Status   MRSA by PCR NEGATIVE NEGATIVE Final    Comment:  The GeneXpert MRSA Assay (FDA approved for NASAL specimens only), is one component of a comprehensive MRSA colonization surveillance program. It is not intended to diagnose MRSA infection nor to guide or monitor treatment for MRSA infections. Performed at [facility], [address] [place], [place] [address]   Respiratory Panel by PCR     Status: None   Collection Time: 11/09/17  4:45 PM  Result Value Ref Range Status   Adenovirus NOT DETECTED NOT DETECTED Final   Coronavirus 229E NOT DETECTED NOT DETECTED Final   Coronavirus HKU1 NOT  DETECTED NOT DETECTED Final   Coronavirus NL63 NOT DETECTED NOT DETECTED Final   Coronavirus OC43 NOT DETECTED NOT DETECTED Final   Metapneumovirus NOT DETECTED NOT DETECTED Final   Rhinovirus / Enterovirus NOT DETECTED NOT DETECTED Final   Influenza A NOT DETECTED NOT DETECTED Final   Influenza B NOT DETECTED NOT DETECTED Final   Parainfluenza Virus 1 NOT DETECTED NOT DETECTED Final   Parainfluenza Virus 2 NOT DETECTED NOT DETECTED Final   Parainfluenza Virus 3 NOT DETECTED NOT DETECTED Final   Parainfluenza Virus 4 NOT DETECTED NOT DETECTED Final   Respiratory Syncytial Virus NOT DETECTED NOT DETECTED Final   Bordetella pertussis NOT DETECTED NOT DETECTED Final   Chlamydophila pneumoniae NOT DETECTED NOT DETECTED Final   Mycoplasma pneumoniae NOT DETECTED NOT DETECTED Final    Comment: Performed at [facility], [place] [address], [place], [place] [address]    IMAGING: Dg Chest 2 View  Result Date: 11/09/2017 CLINICAL DATA:  Hemoptysis 2 days ago. Also dark blood in stools. Diagnosed with pneumonia 3 months ago. Current smoker. History of necrotizing pneumonia and malnutrition. EXAM: CHEST - 2 VIEW COMPARISON:  CT scan of the chest of August 09, 2017 and PA and lateral chest x-ray of the same day. FINDINGS: There is persistent abnormal heterogeneous opacity in the right upper lobe. There is pleural thickening. The appearance of the abnormality is somewhat less prominent today. Elsewhere the right lung is clear as is the left lung. The heart and pulmonary vascularity are normal. The bony thorax exhibits no acute abnormality. IMPRESSION: Slightly decreased conspicuity of consolidation in the right upper lobe superiorly. This may reflect incomplete clearing of the suspected necrotizing pneumonia. However, atypical infection or malignancy are not excluded. Repeat chest CT scanning is recommended. Electronically Signed   By: Clide Remmers  [place] M.D.   On: 11/09/2017 12:35   Ct Chest W  Contrast  Result Date: 11/09/2017 CLINICAL DATA:  Hemoptysis. Non resolving right upper lobe opacity on recent chest radiograph. EXAM: CT CHEST WITH CONTRAST TECHNIQUE: Multidetector CT imaging of the chest was performed during intravenous contrast administration. CONTRAST:  2m ISOVUE-370 IOPAMIDOL (ISOVUE-370) INJECTION 76% COMPARISON:  Chest radiograph on 11/09/2017, and chest CT on 08/09/2017 FINDINGS: Cardiovascular: No acute findings. Aortic and coronary artery atherosclerosis. Mediastinum/Nodes: Mediastinal lymphadenopathy is again seen throughout the right paratracheal, pretracheal, and subcarinal regions, and to lesser degree the AP window. Largest area in the precarinal region measures 2.6 cm on image 50/2, without significant change compared to prior study. Mild right hilar lymphadenopathy shows no significant change, with soft tissue density encasing the right mainstem bronchus and causing stenosis of the central right upper lobe bronchus. Lungs/Pleura: Persistent right upper lobe atelectasis is seen, with central bronchiectasis and larger cystic areas in the right lung apex. A rounded opacity is seen within 1 of these cystic areas in the medial right lung apex measuring 2.3 x 3.3 cm on image 26/2. This  is increased in size compared to previous study and is suspicious for mycetoma, although neoplasm cannot definitely be excluded. Previously seen area of consolidation in the superior and medial aspect of the right lower lobe has resolved since previous study. New peribronchovascular nodular opacities are seen throughout the right lower lobe, most consistent with inflammatory or infectious etiology. No evidence of pleural effusion. Left lung remains clear. Upper Abdomen:  Unremarkable. Musculoskeletal:  No suspicious bone lesions. IMPRESSION: Persistent right upper lobe atelectasis with bronchiectasis and cystic lucencies. Increased size of 3 cm nodular density within right apical cystic lucency is  suspicious for mycetoma, although neoplasm cannot definitely be excluded. Mediastinal and right hilar lymphadenopathy without significant change. Narrowing of central right upper lobe bronchus and bronchus intermedius again seen. Malignancy cannot be excluded. Consider bronchoscopy and EBUS for further evaluation. New peribronchovascular nodular opacities in right lower lobe, consistent with inflammatory or infectious etiology. No evidence of pleural effusion. Aortic Atherosclerosis (ICD10-I70.0). Coronary artery calcification. Electronically Signed   By: Earle Gell M.D.   On: 11/09/2017 14:57    Assessment:   Wayne Chapman is a 62 y.o. male with heavy alcohol and tobacco use, recently admitted with necrotizing PNA in Feb 2019 with work up neg  for TB and cxs were negative.   Now with continued SOB, cough and persistent CT findings. Bronch with mass in R side. Bxp and cx pending.  MRSA PCR neg   Recommendations Cont zosyn Follow up cx and path. If stable for DC and cultures otherwise negative would send home on a 21 day course of oral augmetin.  Thank you very much for allowing me to participate in the care of this patient. Please call with questions.   Cheral Marker. Ola Spurr, MD

## 2017-11-10 NOTE — Progress Notes (Signed)
Initial Nutrition Assessment  DOCUMENTATION CODES:   Severe malnutrition in context of chronic illness  INTERVENTION:   Pt likely at moderate refeeding risk; recommend monitor K, Mg, and P  Ensure Enlive po TID, each supplement provides 350 kcal and 20 grams of protein  Vital Cuisine TID, each supplement provides 520kcal and 22g of protein.   Magic cup TID with meals, each supplement provides 290 kcal and 9 grams of protein  MVI daily  Thiamine 937DS po daily  Folic acid 52m po daily  NUTRITION DIAGNOSIS:   Severe Malnutrition related to chronic illness(h/o TB, necrotizing PNA, substance abuse) as evidenced by severe fat depletion, severe muscle depletion.  GOAL:   Patient will meet greater than or equal to 90% of their needs  MONITOR:   PO intake, Supplement acceptance, Labs, Weight trends, Skin, I & O's  REASON FOR ASSESSMENT:   Malnutrition Screening Tool    ASSESSMENT:   62y/o male with h/o severe malnutrition, substance abuse, TB, necrotizing PNA, admitted with GIB, PNA, SIADH    Met with pt in room today. Pt reports poor appetite and oral intake for several days pta. Pt reports his appetite is improved today. Pt eating 50% of his meals. Per chart, pt is weight stable from last admit in February. Pt reports he has been trying to gain weight; reports he drinks 4 chocolate Ensure per day at home. Pt on CIWA. RD will add supplements to help pt meet his estimated needs. Pt likely at moderate refeeding risk; recommend monitor K, Mg, and P labs.   Medications reviewed and include: folic, MVI, nicotine, thiamine, zosyn  Labs reviewed: Na 128(L), Cl 89(L)  NUTRITION - FOCUSED PHYSICAL EXAM:    Most Recent Value  Orbital Region  Severe depletion  Upper Arm Region  Severe depletion  Thoracic and Lumbar Region  Severe depletion  Buccal Region  Severe depletion  Temple Region  Moderate depletion  Clavicle Bone Region  Severe depletion  Clavicle and Acromion Bone  Region  Severe depletion  Scapular Bone Region  Severe depletion  Dorsal Hand  Mild depletion  Patellar Region  Moderate depletion  Anterior Thigh Region  Moderate depletion  Posterior Calf Region  Moderate depletion  Edema (RD Assessment)  None  Hair  Reviewed  Eyes  Reviewed  Mouth  Reviewed  Skin  Reviewed  Nails  Reviewed     Diet Order:   Diet Order           Diet regular Room service appropriate? Yes; Fluid consistency: Thin  Diet effective now         EDUCATION NEEDS:   Education needs have been addressed  Skin:  Skin Assessment: Reviewed RN Assessment  Last BM:  5/15- melena  Height:   Ht Readings from Last 1 Encounters:  11/09/17 6' 1.5" (1.867 m)    Weight:   Wt Readings from Last 1 Encounters:  11/10/17 134 lb 4.8 oz (60.9 kg)    Ideal Body Weight:  85 kg  BMI:  Body mass index is 17.48 kg/m.  Estimated Nutritional Needs:   Kcal:  2100-2400kcal/day   Protein:  97-110g/day   Fluid:  1.2-1.5L/day   CKoleen DistanceMS, RD, LDN Pager #- [phone]#- [phone] Hours Pager: [phone]

## 2017-11-10 NOTE — Progress Notes (Signed)
[place] at [place] NAME: Wayne Chapman    MR#:  [id number]  DATE OF BIRTH:  [birth year]  SUBJECTIVE: 62 year old admitted for  hemoptysis, blood in the stool.  CHIEF COMPLAINT:   Chief Complaint  Patient presents with  . Cough  . Blood In Stools  Patient is sleeping now  REVIEW OF SYSTEMS:   ROS   Unable to obtain review of systems because he is sedated.   DRUG ALLERGIES:  No Known Allergies  VITALS:  Blood pressure 97/61, pulse 63, temperature 98.7 F (37.1 C), temperature source Oral, resp. rate 18, height 6' 1.5" (1.867 m), weight 60.9 kg (134 lb 4.8 oz), SpO2 97 %.  PHYSICAL EXAMINATION:  GENERAL:  62 y.o.-year-old patient lying in the bed with no acute distress.  EYES: Pupils equal, round, reactive to light  No scleral icterus. Extraocular muscles intact.  HEENT: Head atraumatic, normocephalic. Oropharynx and nasopharynx clear.  NECK:  Supple, no jugular venous distention. No thyroid enlargement, no tenderness.  LUNGS: Normal breath sounds bilaterally, no wheezing, rales,rhonchi or crepitation. No use of accessory muscles of respiration.  CARDIOVASCULAR: S1, S2 normal. No murmurs, rubs, or gallops.  ABDOMEN: Soft, nontender, nondistended. Bowel sounds present. No organomegaly or mass.  EXTREMITIES: No pedal edema, cyanosis, or clubbing.  NEUROLOGIC:  unable to obtain neurological exam because patient is sleeping  pSYCHIATRIC: The patient is sleeping.Marland Kitchen SKIN: No obvious rash, lesion, or ulcer.    LABORATORY PANEL:   CBC Recent Labs  Lab 11/09/17 1101  WBC 6.7  HGB 14.3  HCT 44.1  PLT 194   ------------------------------------------------------------------------------------------------------------------  Chemistries  Recent Labs  Lab 11/09/17 1101  NA 128*  K 4.2  CL 89*  CO2 28  GLUCOSE 110*  BUN 11  CREATININE 0.70  CALCIUM 9.5    ------------------------------------------------------------------------------------------------------------------  Cardiac Enzymes No results for input(s): TROPONINI in the last 168 hours. ------------------------------------------------------------------------------------------------------------------  RADIOLOGY:  Dg Chest 2 View  Result Date: 11/09/2017 CLINICAL DATA:  Hemoptysis 2 days ago. Also dark blood in stools. Diagnosed with pneumonia 3 months ago. Current smoker. History of necrotizing pneumonia and malnutrition. EXAM: CHEST - 2 VIEW COMPARISON:  CT scan of the chest of August 09, 2017 and PA and lateral chest x-ray of the same day. FINDINGS: There is persistent abnormal heterogeneous opacity in the right upper lobe. There is pleural thickening. The appearance of the abnormality is somewhat less prominent today. Elsewhere the right lung is clear as is the left lung. The heart and pulmonary vascularity are normal. The bony thorax exhibits no acute abnormality. IMPRESSION: Slightly decreased conspicuity of consolidation in the right upper lobe superiorly. This may reflect incomplete clearing of the suspected necrotizing pneumonia. However, atypical infection or malignancy are not excluded. Repeat chest CT scanning is recommended. Electronically Signed   By: David  Martinique M.D.   On: 11/09/2017 12:35   Ct Chest W Contrast  Result Date: 11/09/2017 CLINICAL DATA:  Hemoptysis. Non resolving right upper lobe opacity on recent chest radiograph. EXAM: CT CHEST WITH CONTRAST TECHNIQUE: Multidetector CT imaging of the chest was performed during intravenous contrast administration. CONTRAST:  32mL ISOVUE-370 IOPAMIDOL (ISOVUE-370) INJECTION 76% COMPARISON:  Chest radiograph on 11/09/2017, and chest CT on 08/09/2017 FINDINGS: Cardiovascular: No acute findings. Aortic and coronary artery atherosclerosis. Mediastinum/Nodes: Mediastinal lymphadenopathy is again seen throughout the right paratracheal,  pretracheal, and subcarinal regions, and to lesser degree the AP window. Largest area in the precarinal region measures 2.6 cm on image 50/2, without  significant change compared to prior study. Mild right hilar lymphadenopathy shows no significant change, with soft tissue density encasing the right mainstem bronchus and causing stenosis of the central right upper lobe bronchus. Lungs/Pleura: Persistent right upper lobe atelectasis is seen, with central bronchiectasis and larger cystic areas in the right lung apex. A rounded opacity is seen within 1 of these cystic areas in the medial right lung apex measuring 2.3 x 3.3 cm on image 26/2. This is increased in size compared to previous study and is suspicious for mycetoma, although neoplasm cannot definitely be excluded. Previously seen area of consolidation in the superior and medial aspect of the right lower lobe has resolved since previous study. New peribronchovascular nodular opacities are seen throughout the right lower lobe, most consistent with inflammatory or infectious etiology. No evidence of pleural effusion. Left lung remains clear. Upper Abdomen:  Unremarkable. Musculoskeletal:  No suspicious bone lesions. IMPRESSION: Persistent right upper lobe atelectasis with bronchiectasis and cystic lucencies. Increased size of 3 cm nodular density within right apical cystic lucency is suspicious for mycetoma, although neoplasm cannot definitely be excluded. Mediastinal and right hilar lymphadenopathy without significant change. Narrowing of central right upper lobe bronchus and bronchus intermedius again seen. Malignancy cannot be excluded. Consider bronchoscopy and EBUS for further evaluation. New peribronchovascular nodular opacities in right lower lobe, consistent with inflammatory or infectious etiology. No evidence of pleural effusion. Aortic Atherosclerosis (ICD10-I70.0). Coronary artery calcification. Electronically Signed   By: Earle Gell M.D.   On:  11/09/2017 14:57    EKG:   Orders placed or performed during the hospital encounter of 11/09/17  . ED EKG  . ED EKG    ASSESSMENT AND PLAN:   Hemoptysis likely secondary to pneumonia: No further hemoptysis reported.  We are continuing antibiotics per possible pneumonia with Zosyn MRSA screen negative so discontinue vancomycin, discontinue droplet precautions.  Discussed with Dr. Epimenio Foot on follow-up possible bronchoscopy tomorrow. 2.  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 3.  Weight loss: Evaluate with bronchoscopy for possible neoplasm. And had TB test done in April.  It was negative, patient also had AFB x2  Negative at that time.  Patient was discharged with Augmentin course of 4 weeks.  History of heavy alcohol abuse: Continue CIWA protocol. #4 severe malnutrition secondary to chronic illness.  Seen by dietitian, reconsult dietitian this time. All the records are reviewed and case discussed with Care Management/Social Workerr. Management plans discussed with the patient, family and they are in agreement.  CODE STATUS: Full code  TOTAL TIME TAKING CARE OF THIS PATIENT: 35 minutes.   POSSIBLE D/C IN 3-4 DAYS, DEPENDING ON CLINICAL CONDITION.   Epifanio Lesches M.D on 11/10/2017 at 2:00 PM  Between 7am to 6pm - Pager - [phone]  After 6pm go to [email or web address] - password [email or web address] [facility]  Office  [phone]  CC: Primary care physician; Patient, [name]   Note: This dictation was prepared with Dragon dictation along with smaller phrase technology. Any transcriptional errors that result from this process are unintentional.

## 2017-11-11 ENCOUNTER — Encounter: Payer: Self-pay | Admitting: *Deleted

## 2017-11-11 ENCOUNTER — Encounter
Admission: EM | Disposition: A | Discharge status: Home / Self Care | Payer: Self-pay | Source: Home / Self Care | Attending: Internal Medicine

## 2017-11-11 HISTORY — PX: FLEXIBLE BRONCHOSCOPY: SHX5094

## 2017-11-11 LAB — LEGIONELLA PNEUMOPHILA SEROGP 1 UR AG: L. PNEUMOPHILA SEROGP 1 UR AG: NEGATIVE

## 2017-11-11 LAB — BASIC METABOLIC PANEL
Anion gap: 10 (ref 5–15)
BUN: 6 mg/dL (ref 6–20)
CHLORIDE: 102 mmol/L (ref 101–111)
CO2: 21 mmol/L — ABNORMAL LOW (ref 22–32)
Calcium: 8.9 mg/dL (ref 8.9–10.3)
Creatinine, Ser: 0.59 mg/dL — ABNORMAL LOW (ref 0.61–1.24)
GFR calc Af Amer: >60 mL/min (ref >60)
GFR calc non Af Amer: >60 mL/min (ref >60)
GLUCOSE: 105 mg/dL — AB (ref 65–99)
Potassium: 3.8 mmol/L (ref 3.5–5.1)
Sodium: 133 mmol/L — ABNORMAL LOW (ref 135–145)

## 2017-11-11 LAB — MAGNESIUM: Magnesium: 1.9 mg/dL (ref 1.7–2.4)

## 2017-11-11 LAB — PROCALCITONIN: Procalcitonin: <0.1 ng/mL

## 2017-11-11 LAB — PHOSPHORUS: PHOSPHORUS: 3.7 mg/dL (ref 2.5–4.6)

## 2017-11-11 SURGERY — BRONCHOSCOPY, FLEXIBLE
Anesthesia: Moderate Sedation

## 2017-11-11 MED ORDER — FENTANYL CITRATE (PF) 100 MCG/2ML IJ SOLN
25.0000 ug | INTRAMUSCULAR | Status: DC
  Administered 2017-11-11 (×2): 50 ug via INTRAVENOUS

## 2017-11-11 MED ORDER — LIDOCAINE HCL 2 % EX GEL
1.0000 "application " | Freq: Once | CUTANEOUS | Status: DC
  Filled 2017-11-11: qty 5

## 2017-11-11 MED ORDER — GUAIFENESIN-DM 100-10 MG/5ML PO SYRP
5.0000 mL | ORAL_SOLUTION | ORAL | Status: DC | PRN
  Administered 2017-11-12 – 2017-11-13 (×2): 5 mL via ORAL
  Filled 2017-11-11 (×2): qty 5

## 2017-11-11 MED ORDER — MIDAZOLAM HCL 2 MG/2ML IJ SOLN
INTRAMUSCULAR | Status: AC
  Filled 2017-11-11: qty 8

## 2017-11-11 MED ORDER — SODIUM CHLORIDE FLUSH 0.9 % IV SOLN
INTRAVENOUS | Status: AC
  Administered 2017-11-11: 14:00:00 10 mL
  Filled 2017-11-11: qty 20

## 2017-11-11 MED ORDER — BUTAMBEN-TETRACAINE-BENZOCAINE 2-2-14 % EX AERO
1.0000 | INHALATION_SPRAY | Freq: Once | CUTANEOUS | Status: DC
  Filled 2017-11-11: qty 20

## 2017-11-11 MED ORDER — FENTANYL CITRATE (PF) 100 MCG/2ML IJ SOLN
INTRAMUSCULAR | Status: AC
  Administered 2017-11-11: 09:00:00 50 ug via INTRAVENOUS
  Filled 2017-11-11: qty 4

## 2017-11-11 MED ORDER — MIDAZOLAM HCL 2 MG/2ML IJ SOLN
INTRAMUSCULAR | Status: DC | PRN
  Administered 2017-11-11: 1 mg via INTRAVENOUS
  Administered 2017-11-11: 2 mg via INTRAVENOUS

## 2017-11-11 MED ORDER — PHENYLEPHRINE HCL 0.25 % NA SOLN
1.0000 | Freq: Four times a day (QID) | NASAL | Status: DC | PRN
  Filled 2017-11-11: qty 15

## 2017-11-11 NOTE — Progress Notes (Signed)
Pharmacy Antibiotic Note  Wayne Chapman is a 62 y.o. male admitted on 11/09/2017 with PNA.  Pharmacy has been consulted for Zosyn dosing. This is hospital Day 3. Pulmonology is recommending continuing Zosyn at this point in treatment and Dr Vianne Bulls is in agreement.  Plan: Continue Zosyn at current dose of 3.375 gm IV Q8H EI.   Height: 6' 1.5" (186.7 cm) Weight: 134 lb 4.8 oz (60.9 kg) IBW/kg (Calculated) : 81.05  Temp (24hrs), Avg:98 F (36.7 C), Min:96.9 F (36.1 C), Max:98.6 F (37 C)  Recent Labs  Lab 11/09/17 1101 11/09/17 1248 11/11/17 0429  WBC 6.7  --   --   CREATININE 0.70  --  0.59*  LATICACIDVEN  --  1.2  --     Estimated Creatinine Clearance: 84.6 mL/min (A) (by C-G formula based on SCr of 0.59 mg/dL (L)).    No Known Allergies  Antimicrobials this admission: Zosyn 5/15>> Vancomycin 5/15>>5/16  Dose adjustments this admission: none  Microbiology results: MRSA PCR(-) 5/15, PCT 0.1 Resp Cx: 5/15 rare GNR, GPC in pairs, GNR BCx: 5/15 pending  Thank you for allowing pharmacy to be a part of this patient's care.  Dallie Piles, Pharm.D., BCPS Clinical Pharmacist 11/11/2017 12:54 PM

## 2017-11-11 NOTE — OR Nursing (Signed)
Notified floor staff pt to return approx [phone], advises she will tell Rodman Key.

## 2017-11-11 NOTE — Op Note (Signed)
[facility] Patient Name: Wayne Chapman Procedure Date: 11/11/2017 7:38 AM MRN: [id number] Account #: [id number] Date of Birth: [birth year] Admit Type: Inpatient Age: 62 Room: [facility] PROCEDURE RM 01 Gender: Male Note Status: Finalized Attending MD: Allyne Gee , MD Procedure:         Bronchoscopy Indications:       Right mainstem mass, Hemoptysis with abnormal CXR Providers:         Allyne Gee, MD Referring MD:       Medicines:         Midazolam 3 mg mg IV, Fentanyl 409 mcg IV Complications:     No immediate complications Procedure:         Pre-Anesthesia Assessment:                    - A History and Physical has been performed. The patient's                     medications, allergies and sensitivities have been                     reviewed.                    - The risks and benefits of the procedure and the sedation                     options and risks were discussed with the patient. All                     questions were answered and informed consent was obtained.                    - Pre-procedure physical examination revealed no                     contraindications to sedation.                    - ASA Grade Assessment: III - A patient with severe                     systemic disease.                    - After reviewing the risks and benefits, the patient was                     deemed in satisfactory condition to undergo the procedure.                    - The anesthesia plan was to use moderate                     sedation/analgesia.                    - Immediately prior to administration of medications, the                     patient was re-assessed for adequacy to receive sedatives.                    - The heart rate, respiratory rate, oxygen saturations,                     blood pressure, adequacy of pulmonary  ventilation, and                     response to care were monitored throughout the procedure.                    - The physical  status of the patient was re-assessed after                     the procedure.                    After obtaining informed consent, the bronchoscope was                     passed under direct vision. Throughout the procedure, the                     patient's blood pressure, pulse, and oxygen saturations                     were monitored continuously. the Bronchoscope Olympus                     [id number] was introduced through the right                     nostril and advanced to the tracheobronchial tree of both                     lungs. The procedure was accomplished without difficulty.                     The patient tolerated the procedure well. The total                     duration of the procedure was 30 minutes. Findings:      The nasopharynx/oropharynx appears normal. The larynx appears normal.       The vocal cords appear normal. The subglottic space is normal. The       trachea is of normal caliber. The carina is sharp. The tracheobronchial       tree of the left lung was examined to at least the first subsegmental       level. Bronchial mucosa and anatomy in the left lung are normal; there       are no endobronchial lesions, and no secretions.      Right Lung Abnormalities: A partially obstructing mass was found       proximally, at the orifice in the right mainstem bronchus. The mass was       medium-sized and circumferential. The lesion was successfully traversed.       Endobronchial biopsies were performed in the right mainstem bronchus       using forceps and sent for histopathology examination. Two samples were       obtained. Washings were obtained in the right mainstem bronchus and sent       for cell count, bacterial culture, viral smears & culture, and fungal &       AFB analysis and cytology. The return was bloody.      Brushings of a lesion were obtained in the right mainstem bronchus with       a cytology brush and sent for routine cytology. Two  samples were       obtained. Impression:        -  Right mainstem mass                    - Hemoptysis with abnormal CXR                    - The airway examination of the left lung was normal.                    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                    - An endobronchial biopsy was performed.                    - Washings were obtained. Recommendation:    - Await brushing, cytology and washing results. Devona Konig, MD Allyne Gee, MD 11/11/2017 9:10:13 AM This report has been signed electronically. Number of Addenda: 0 Note Initiated On: 11/11/2017 7:38 AM      [facility]

## 2017-11-11 NOTE — Progress Notes (Signed)
[place] at [place] NAME: Wayne Chapman    MR#:  [id number]  DATE OF BIRTH:  [birth year]  SUBJECTIVE: Patient alert, awake, oriented, admitted yesterday for hemoptysis.  CHIEF COMPLAINT:   Chief Complaint  Patient presents with  . Cough  . Blood In Stools  Had bronchoscopy this morning, talk with Dr. Heidi Dach  he found that he has right mainstem bronchus mass concerning for malignancy recommended oncology consult.  Discussed with patient, patient's wife about this. No further hemoptysis. REVIEW OF SYSTEMS:   Review of Systems  Constitutional: Negative for chills and fever.  HENT: Negative for hearing loss.   Eyes: Negative for blurred vision, double vision and photophobia.  Respiratory: Negative for cough, hemoptysis and shortness of breath.   Cardiovascular: Negative for palpitations, orthopnea and leg swelling.  Gastrointestinal: Negative for abdominal pain, diarrhea and vomiting.  Genitourinary: Negative for dysuria and urgency.  Musculoskeletal: Negative for myalgias and neck pain.  Skin: Negative for rash.  Neurological: Negative for dizziness, focal weakness, seizures, weakness and headaches.  Psychiatric/Behavioral: Negative for memory loss. The patient does not have insomnia.      Alert, awake, oriented, denies any complaints.   DRUG ALLERGIES:  No Known Allergies  VITALS:  Blood pressure (!) 100/53, pulse (!) 52, temperature 97.7 F (36.5 C), temperature source Temporal, resp. rate 14, height 6' 1.5" (1.867 m), weight 60.9 kg (134 lb 4.8 oz), SpO2 97 %.  PHYSICAL EXAMINATION:  GENERAL:  62 y.o.-year-old patient lying in the bed with no acute distress.  EYES: Pupils equal, round, reactive to light  No scleral icterus. Extraocular muscles intact.  HEENT: Head atraumatic, normocephalic. Oropharynx and nasopharynx clear.  NECK:  Supple, no jugular venous distention. No thyroid enlargement, no tenderness.  LUNGS:  Normal breath sounds bilaterally, no wheezing, rales,rhonchi or crepitation. No use of accessory muscles of respiration.  CARDIOVASCULAR: S1, S2 normal. No murmurs, rubs, or gallops.  ABDOMEN: Soft, nontender, nondistended. Bowel sounds present. No organomegaly or mass.  EXTREMITIES: No pedal edema, cyanosis, or clubbing.  NEUROLOGIC:  unable to obtain neurological exam because patient is sleeping  pSYCHIATRIC: The patient is sleeping.Marland Kitchen SKIN: No obvious rash, lesion, or ulcer.    LABORATORY PANEL:   CBC Recent Labs  Lab 11/09/17 1101  WBC 6.7  HGB 14.3  HCT 44.1  PLT 194   ------------------------------------------------------------------------------------------------------------------  Chemistries  Recent Labs  Lab 11/11/17 0429  NA 133*  K 3.8  CL 102  CO2 21*  GLUCOSE 105*  BUN 6  CREATININE 0.59*  CALCIUM 8.9  MG 1.9   ------------------------------------------------------------------------------------------------------------------  Cardiac Enzymes No results for input(s): TROPONINI in the last 168 hours. ------------------------------------------------------------------------------------------------------------------  RADIOLOGY:  Ct Chest W Contrast  Result Date: 11/09/2017 CLINICAL DATA:  Hemoptysis. Non resolving right upper lobe opacity on recent chest radiograph. EXAM: CT CHEST WITH CONTRAST TECHNIQUE: Multidetector CT imaging of the chest was performed during intravenous contrast administration. CONTRAST:  4m ISOVUE-370 IOPAMIDOL (ISOVUE-370) INJECTION 76% COMPARISON:  Chest radiograph on 11/09/2017, and chest CT on 08/09/2017 FINDINGS: Cardiovascular: No acute findings. Aortic and coronary artery atherosclerosis. Mediastinum/Nodes: Mediastinal lymphadenopathy is again seen throughout the right paratracheal, pretracheal, and subcarinal regions, and to lesser degree the AP window. Largest area in the precarinal region measures 2.6 cm on image 50/2, without  significant change compared to prior study. Mild right hilar lymphadenopathy shows no significant change, with soft tissue density encasing the right mainstem bronchus and causing stenosis of the central right upper  lobe bronchus. Lungs/Pleura: Persistent right upper lobe atelectasis is seen, with central bronchiectasis and larger cystic areas in the right lung apex. A rounded opacity is seen within 1 of these cystic areas in the medial right lung apex measuring 2.3 x 3.3 cm on image 26/2. This is increased in size compared to previous study and is suspicious for mycetoma, although neoplasm cannot definitely be excluded. Previously seen area of consolidation in the superior and medial aspect of the right lower lobe has resolved since previous study. New peribronchovascular nodular opacities are seen throughout the right lower lobe, most consistent with inflammatory or infectious etiology. No evidence of pleural effusion. Left lung remains clear. Upper Abdomen:  Unremarkable. Musculoskeletal:  No suspicious bone lesions. IMPRESSION: Persistent right upper lobe atelectasis with bronchiectasis and cystic lucencies. Increased size of 3 cm nodular density within right apical cystic lucency is suspicious for mycetoma, although neoplasm cannot definitely be excluded. Mediastinal and right hilar lymphadenopathy without significant change. Narrowing of central right upper lobe bronchus and bronchus intermedius again seen. Malignancy cannot be excluded. Consider bronchoscopy and EBUS for further evaluation. New peribronchovascular nodular opacities in right lower lobe, consistent with inflammatory or infectious etiology. No evidence of pleural effusion. Aortic Atherosclerosis (ICD10-I70.0). Coronary artery calcification. Electronically Signed   By: Earle Gell M.D.   On: 11/09/2017 14:57    EKG:   Orders placed or performed during the hospital encounter of 11/09/17  . ED EKG  . ED EKG    ASSESSMENT AND PLAN:    Hemoptysis likely secondary to pneumonia: No further hemoptysis reported.  We are continuing antibiotics per possible pneumonia with Zosyn MRSA screen negative so discontinue vancomycin, discontinue droplet precautions.   Status post bronchoscopy and biopsy, BAL today.  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 Devona Konig note.  Waiting for biopsy results.  Recommended oncology consult, discussed with patient and patient's wife.  2.  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  Discontinue IV fluids, encourage p.o. intake. 3.  Weight loss:  status post bronchoscopy today morning, showing right mainstem mass concerning for malignancy.   and had TB test done in April.  It was negative, patient also had AFB x2  Negative at that time.  Patient was discharged with Augmentin course of 4 weeks.  That time for pneumonia.  History of heavy alcohol abuse: Continue CIWA protocol.,  Patient is not in withdrawal. #4 severe malnutrition secondary to chronic illness.  Seen by dietitian, reconsult dietitian this time.  Continue Ensure. #5 prognosis depends upon stage of cancer.  Discussed with wife, likely discharge hopefully Sunday or Monday.  All the records are reviewed and case discussed with Care Management/Social Workerr. Management plans discussed with the patient, family and they are in agreement.  CODE STATUS: Full code  TOTAL TIME TAKING CARE OF THIS PATIENT: 35 minutes.   POSSIBLE D/C IN 3-4 DAYS, DEPENDING ON CLINICAL CONDITION.   Epifanio Lesches M.D on 11/11/2017 at 1:19 PM  Between 7am to 6pm - Pager - [phone]  After 6pm go to [email or web address] - password [email or web address] [facility]  Office  [phone]  CC: Primary care physician; Patient, [name]   Note: This dictation was prepared with Dragon dictation along with smaller phrase technology. Any transcriptional errors that result from this process are  unintentional.

## 2017-11-12 DIAGNOSIS — Z79899 Other long term (current) drug therapy: Secondary | ICD-10-CM

## 2017-11-12 DIAGNOSIS — J188 Other pneumonia, unspecified organism: Secondary | ICD-10-CM

## 2017-11-12 DIAGNOSIS — R918 Other nonspecific abnormal finding of lung field: Secondary | ICD-10-CM

## 2017-11-12 DIAGNOSIS — F1020 Alcohol dependence, uncomplicated: Secondary | ICD-10-CM

## 2017-11-12 DIAGNOSIS — F17210 Nicotine dependence, cigarettes, uncomplicated: Secondary | ICD-10-CM

## 2017-11-12 DIAGNOSIS — F418 Other specified anxiety disorders: Secondary | ICD-10-CM

## 2017-11-12 LAB — CULTURE, RESPIRATORY W GRAM STAIN
Culture: NORMAL
Special Requests: NORMAL

## 2017-11-12 LAB — ACID FAST SMEAR (AFB, MYCOBACTERIA)

## 2017-11-12 LAB — CULTURE, RESPIRATORY

## 2017-11-12 LAB — ACID FAST SMEAR (AFB): ACID FAST SMEAR - AFSCU2: NEGATIVE

## 2017-11-12 NOTE — Progress Notes (Signed)
[place] [facility] [place] NAME: Wayne Chapman    MR#:  [id number]  DATE OF BIRTH:  [birth year]  SUBJECTIVE: Patient alert, awake, oriented, admitted yesterday for hemoptysis.  Says he has hemoptysis on and off.  But better than before.  CHIEF COMPLAINT:   Chief Complaint  Patient presents with  . Cough  . Blood In Stools  Had bronchoscopy this morning, talk with Dr. Heidi Dach  he found that he has right mainstem bronchus mass concerning for malignancy recommended oncology consult.  Discussed with patient, patient's wife about this. No further hemoptysis. REVIEW OF SYSTEMS:   Review of Systems  Constitutional: Negative for chills and fever.  HENT: Negative for hearing loss.   Eyes: Negative for blurred vision, double vision and photophobia.  Respiratory: Negative for cough, hemoptysis and shortness of breath.   Cardiovascular: Negative for palpitations, orthopnea and leg swelling.  Gastrointestinal: Negative for abdominal pain, diarrhea and vomiting.  Genitourinary: Negative for dysuria and urgency.  Musculoskeletal: Negative for myalgias and neck pain.  Skin: Negative for rash.  Neurological: Negative for dizziness, focal weakness, seizures, weakness and headaches.  Psychiatric/Behavioral: Negative for memory loss. The patient does not have insomnia.      Alert, awake, oriented, denies any complaints.   DRUG ALLERGIES:  No Known Allergies  VITALS:  Blood pressure (!) 97/54, pulse (!) 51, temperature 98 F (36.7 C), temperature source Oral, resp. rate 15, height 6' 1.5" (1.867 m), weight 60.9 kg (134 lb 4.8 oz), SpO2 99 %.  PHYSICAL EXAMINATION:  GENERAL:  62 y.o.-year-old patient lying in the bed with no acute distress.  EYES: Pupils equal, round, reactive to light  No scleral icterus. Extraocular muscles intact.  HEENT: Head atraumatic, normocephalic. Oropharynx and nasopharynx clear.  NECK:  Supple, no jugular venous distention.  No thyroid enlargement, no tenderness.  LUNGS: Normal breath sounds bilaterally, no wheezing, rales,rhonchi or crepitation. No use of accessory muscles of respiration.  CARDIOVASCULAR: S1, S2 normal. No murmurs, rubs, or gallops.  ABDOMEN: Soft, nontender, nondistended. Bowel sounds present. No organomegaly or mass.  EXTREMITIES: No pedal edema, cyanosis, or clubbing.  NEUROLOGIC:  unable to obtain neurological exam because patient is sleeping  pSYCHIATRIC: The patient is sleeping.Marland Kitchen SKIN: No obvious rash, lesion, or ulcer.    LABORATORY PANEL:   CBC Recent Labs  Lab 11/09/17 1101  WBC 6.7  HGB 14.3  HCT 44.1  PLT 194   ------------------------------------------------------------------------------------------------------------------  Chemistries  Recent Labs  Lab 11/11/17 0429  NA 133*  K 3.8  CL 102  CO2 21*  GLUCOSE 105*  BUN 6  CREATININE 0.59*  CALCIUM 8.9  MG 1.9   ------------------------------------------------------------------------------------------------------------------  Cardiac Enzymes No results for input(s): TROPONINI in the last 168 hours. ------------------------------------------------------------------------------------------------------------------  RADIOLOGY:  No results found.  EKG:   Orders placed or performed during the hospital encounter of 11/09/17  . ED EKG  . ED EKG    ASSESSMENT AND PLAN:   Hemoptysis likely secondary to pneumonia: No further hemoptysis reported.  We are continuing antibiotics per possible pneumonia with Zosyn MRSA screen negative so discontinue vancomycin, discontinue droplet precautions.   Status post bronchoscopy and biopsy, BAL today.  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 Devona Konig note.  Waiting for biopsy results.  Recommended oncology consult, discussed with patient and patient's wife.  2.  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  Discontinued IV  fluids, encourage p.o.  intake. 3.  Weight loss:  status post bronchoscopy  showing right mainstem mass concerning for malignancy.  For oncology consult.   and had TB test done in April.  It was negative, patient also had AFB x2  Negative at that time.  Patient was discharged with Augmentin course of 4 weeks.  That time for pneumonia.  History of heavy alcohol abuse: Continue CIWA protocol.,  Patient is not in withdrawal. #4 severe malnutrition secondary to chronic illness.  Seen by dietitian, reconsult dietitian this time.  Continue Ensure. #5 prognosis depends upon stage of cancer.  Discussed with wife, no IV antibiotics and discharged home with Augmentin for 3 weeks as per ID note.  Likely discharge tomorrow.  Waiting for oncology consult.  All the records are reviewed and case discussed with Care Management/Social Workerr. Management plans discussed with the patient, family and they are in agreement.  CODE STATUS: Full code  TOTAL TIME TAKING CARE OF THIS PATIENT: 35 minutes.   POSSIBLE D/C IN 3-4 DAYS, DEPENDING ON CLINICAL CONDITION.   Epifanio Lesches M.D on 11/12/2017 at 10:22 AM  Between 7am to 6pm - Pager - [phone]  After 6pm go to [email or web address] - password [email or web address]  Office  [phone]  CC: Primary care physician; Patient, [name]   Note: This dictation was prepared with Dragon dictation along with smaller phrase technology. Any transcriptional errors that result from this process are unintentional.

## 2017-11-12 NOTE — Consult Note (Signed)
[place]  Telephone:([phone] [phone]  ID: Wayne Chapman OB: 1956/04/06  MR#: [id number]  QIO#:[id number]  Patient Care Team: Patient, [name] as PCP - General (General Practice)  CHIEF COMPLAINT: Right upper lobe mass suspicious for malignancy.  INTERVAL HISTORY: Patient is a 62 year old male past medical history significant for chronic alcoholism and necrotizing pneumonia who was noted to have a right upper lobe mass on CT scan highly suspicious for malignancy.  Bronchoscopy yesterday revealed a obstructing right mainstem bronchus mass.  Patient states he has improved since admission, but not back to his baseline.  He has no neurologic complaints.  He denies any fevers.  He has had a significant amount of unintentional weight loss over the past several months.  He currently denies any chest pain, shortness of breath, cough, or hemoptysis.  He has no nausea, vomiting, constipation, or diarrhea.  He has no urinary complaints.  Patient offers no further specific complaints today.  REVIEW OF SYSTEMS:   Review of Systems  Constitutional: Positive for weight loss. Negative for fever and malaise/fatigue.  Respiratory: Negative.  Negative for cough, hemoptysis and shortness of breath.   Cardiovascular: Positive for leg swelling. Negative for chest pain.  Gastrointestinal: Negative.  Negative for abdominal pain.  Genitourinary: Negative.  Negative for dysuria.  Musculoskeletal: Negative.  Negative for back pain.  Skin: Negative.  Negative for rash.  Neurological: Negative.  Negative for sensory change, focal weakness, weakness and headaches.  Psychiatric/Behavioral: Positive for substance abuse. The patient is not nervous/anxious.     As per HPI. Otherwise, a complete review of systems is negative.  PAST MEDICAL HISTORY: Past Medical History:  Diagnosis Date  . Anxiety   . Depression     PAST SURGICAL HISTORY: Past Surgical History:  Procedure  Laterality Date  . FLEXIBLE BRONCHOSCOPY N/A 11/11/2017   Procedure: FLEXIBLE BRONCHOSCOPY;  Surgeon: Allyne Gee, MD;  Location: [facility] ORS;  Service: Pulmonary;  Laterality: N/A;  . none      FAMILY HISTORY: Family History  Problem Relation Age of Onset  . Diabetes Mellitus II Mother     ADVANCED DIRECTIVES (Y/N):  _0 @  HEALTH MAINTENANCE: Social History   Tobacco Use  . Smoking status: Current Every Day Smoker    Packs/day: 1.00    Types: Cigarettes  . Smokeless tobacco: Never Used  Substance Use Topics  . Alcohol use: Yes    Comment: alot   . Drug use: Yes     Colonoscopy:  PAP:  Bone density:  Lipid panel:  No Known Allergies  Current Facility-Administered Medications  Medication Dose Route Frequency Provider Last Rate Last Dose  . butamben-tetracaine-benzocaine (CETACAINE) spray 1 spray  1 spray Topical Once Devona Konig A, MD      . feeding supplement (ENSURE ENLIVE) (ENSURE ENLIVE) liquid 237 mL  237 mL Oral TID BM Epifanio Lesches, MD      . fentaNYL (SUBLIMAZE) injection 25 mcg  25 mcg Intravenous UD Devona Konig A, MD   50 mcg at 11/11/17 0856  . folic acid (FOLVITE) tablet 1 mg  1 mg Oral Daily Gouru, Aruna, MD   1 mg at 11/12/17 1143  . guaiFENesin-dextromethorphan (ROBITUSSIN DM) 100-10 MG/5ML syrup 5 mL  5 mL Oral Q4H PRN Epifanio Lesches, MD      . lidocaine (XYLOCAINE) 2 % jelly 1 application  1 application Topical Once Devona Konig A, MD      . LORazepam (ATIVAN) injection 0-4 mg  0-4 mg  Intravenous Q12H Lance Coon, MD      . LORazepam (ATIVAN) tablet 1 mg  1 mg Oral Q6H PRN Lance Coon, MD       Or  . LORazepam (ATIVAN) injection 1 mg  1 mg Intravenous Q6H PRN Lance Coon, MD      . midazolam (VERSED) injection    PRN Allyne Gee, MD   1 mg at 11/11/17 [phone]  . multivitamin with minerals tablet 1 tablet  1 tablet Oral Daily Gouru, Aruna, MD   1 tablet at 11/12/17 1143  . nicotine (NICODERM CQ - dosed in mg/24 hours) patch 14 mg   14 mg Transdermal Daily Gouru, Aruna, MD   14 mg at 11/12/17 1143  . phenylephrine (NEO-SYNEPHRINE) 0.25 % nasal spray 1 spray  1 spray Each Nare Q6H PRN Devona Konig A, MD      . piperacillin-tazobactam (ZOSYN) IVPB 3.375 g  3.375 g Intravenous Jeanella Anton, MD   Stopped at 11/12/17 1630  . thiamine (VITAMIN B-1) tablet 100 mg  100 mg Oral Daily Gouru, Aruna, MD   100 mg at 11/12/17 1143   Or  . thiamine (B-1) injection 100 mg  100 mg Intravenous Daily Gouru, Aruna, MD        OBJECTIVE: Vitals:   11/12/17 0745 11/12/17 1438  BP: (!) 99/57 104/61  Pulse: (!) 51 (!) 53  Resp: 16 16  Temp: 98.1 F (36.7 C) 98.6 F (37 C)  SpO2: 100% 100%     Body mass index is 17.48 kg/m.    ECOG FS:1 - Symptomatic but completely ambulatory  General: Thin, no acute distress. Eyes: Pink conjunctiva, anicteric sclera. HEENT: Normocephalic, moist mucous membranes, clear oropharnyx. Lungs: Clear to auscultation bilaterally. Heart: Regular rate and rhythm. No rubs, murmurs, or gallops. Abdomen: Soft, nontender, nondistended. No organomegaly noted, normoactive bowel sounds. Musculoskeletal: No edema, cyanosis, or clubbing. Neuro: Alert, answering all questions appropriately. Cranial nerves grossly intact. Skin: No rashes or petechiae noted. Psych: Normal affect.   LAB RESULTS:  Lab Results  Component Value Date   NA 133 (L) 11/11/2017   K 3.8 11/11/2017   CL 102 11/11/2017   CO2 21 (L) 11/11/2017   GLUCOSE 105 (H) 11/11/2017   BUN 6 11/11/2017   CREATININE 0.59 (L) 11/11/2017   CALCIUM 8.9 11/11/2017   PROT 7.8 08/09/2017   ALBUMIN 3.1 (L) 08/09/2017   AST 26 08/09/2017   ALT 13 (L) 08/09/2017   ALKPHOS 65 08/09/2017   BILITOT 0.8 08/09/2017   GFRNONAA >60 11/11/2017   GFRAA >60 11/11/2017    Lab Results  Component Value Date   WBC 6.7 11/09/2017   NEUTROABS 5.9 08/09/2017   HGB 14.3 11/09/2017   HCT 44.1 11/09/2017   MCV 77.5 (L) 11/09/2017   PLT 194 11/09/2017      STUDIES: Dg Chest 2 View  Result Date: 11/09/2017 CLINICAL DATA:  Hemoptysis 2 days ago. Also dark blood in stools. Diagnosed with pneumonia 3 months ago. Current smoker. History of necrotizing pneumonia and malnutrition. EXAM: CHEST - 2 VIEW COMPARISON:  CT scan of the chest of August 09, 2017 and PA and lateral chest x-ray of the same day. FINDINGS: There is persistent abnormal heterogeneous opacity in the right upper lobe. There is pleural thickening. The appearance of the abnormality is somewhat less prominent today. Elsewhere the right lung is clear as is the left lung. The heart and pulmonary vascularity are normal. The bony thorax exhibits no acute abnormality. IMPRESSION: Slightly decreased  conspicuity of consolidation in the right upper lobe superiorly. This may reflect incomplete clearing of the suspected necrotizing pneumonia. However, atypical infection or malignancy are not excluded. Repeat chest CT scanning is recommended. Electronically Signed   By: David  Martinique M.D.   On: 11/09/2017 12:35   Ct Chest W Contrast  Result Date: 11/09/2017 CLINICAL DATA:  Hemoptysis. Non resolving right upper lobe opacity on recent chest radiograph. EXAM: CT CHEST WITH CONTRAST TECHNIQUE: Multidetector CT imaging of the chest was performed during intravenous contrast administration. CONTRAST:  8m ISOVUE-370 IOPAMIDOL (ISOVUE-370) INJECTION 76% COMPARISON:  Chest radiograph on 11/09/2017, and chest CT on 08/09/2017 FINDINGS: Cardiovascular: No acute findings. Aortic and coronary artery atherosclerosis. Mediastinum/Nodes: Mediastinal lymphadenopathy is again seen throughout the right paratracheal, pretracheal, and subcarinal regions, and to lesser degree the AP window. Largest area in the precarinal region measures 2.6 cm on image 50/2, without significant change compared to prior study. Mild right hilar lymphadenopathy shows no significant change, with soft tissue density encasing the right mainstem  bronchus and causing stenosis of the central right upper lobe bronchus. Lungs/Pleura: Persistent right upper lobe atelectasis is seen, with central bronchiectasis and larger cystic areas in the right lung apex. A rounded opacity is seen within 1 of these cystic areas in the medial right lung apex measuring 2.3 x 3.3 cm on image 26/2. This is increased in size compared to previous study and is suspicious for mycetoma, although neoplasm cannot definitely be excluded. Previously seen area of consolidation in the superior and medial aspect of the right lower lobe has resolved since previous study. New peribronchovascular nodular opacities are seen throughout the right lower lobe, most consistent with inflammatory or infectious etiology. No evidence of pleural effusion. Left lung remains clear. Upper Abdomen:  Unremarkable. Musculoskeletal:  No suspicious bone lesions. IMPRESSION: Persistent right upper lobe atelectasis with bronchiectasis and cystic lucencies. Increased size of 3 cm nodular density within right apical cystic lucency is suspicious for mycetoma, although neoplasm cannot definitely be excluded. Mediastinal and right hilar lymphadenopathy without significant change. Narrowing of central right upper lobe bronchus and bronchus intermedius again seen. Malignancy cannot be excluded. Consider bronchoscopy and EBUS for further evaluation. New peribronchovascular nodular opacities in right lower lobe, consistent with inflammatory or infectious etiology. No evidence of pleural effusion. Aortic Atherosclerosis (ICD10-I70.0). Coronary artery calcification. Electronically Signed   By: JEarle GellM.D.   On: 11/09/2017 14:57    ASSESSMENT: Right upper lobe mass suspicious for malignancy.  PLAN:    1.  Right upper lobe mass: Imaging and bronchoscopy results reviewed independently.  Appreciate pulmonary input, biopsies are pending at time of dictation.  This is highly suspicious for underlying primary lung cancer.   No further intervention is needed.  Okay to discharge from an oncology standpoint.  Patient will require brain imaging as well as a PET scan to complete the staging work-up.  Please ensure patient follows up in the cancer center later this week on Wednesday or Thursday to discuss his biopsy results and additional diagnostic planning.  Appreciate consult, call with questions.  TLloyd Huger MD   11/12/2017 6:15 PM

## 2017-11-13 LAB — CULTURE, RESPIRATORY: GRAM STAIN: NONE SEEN

## 2017-11-13 LAB — CULTURE, RESPIRATORY W GRAM STAIN

## 2017-11-13 MED ORDER — ENSURE ENLIVE PO LIQD: 237.0000 mL | Freq: Three times a day (TID) | ORAL | 12 refills | Status: AC

## 2017-11-13 MED ORDER — AMOXICILLIN-POT CLAVULANATE 875-125 MG PO TABS: 1.0000 | ORAL_TABLET | Freq: Two times a day (BID) | ORAL | 0 refills | Status: DC

## 2017-11-13 MED ORDER — THIAMINE HCL 100 MG PO TABS: 100.0000 mg | ORAL_TABLET | Freq: Every day | ORAL | 0 refills | Status: AC

## 2017-11-13 NOTE — Discharge Summary (Signed)
Wayne Chapman, is a 62 y.o. male  DOB 06/25/1956  MRN [id number].  Admission date:  11/09/2017  Admitting Physician  Nicholes Mango, MD  Discharge Date:  11/13/2017   Primary MD  Patient, [name]  Recommendations for primary care physician for thin follow-up with Dr. Grayland Ormond at cancer center in 1 week   Admission Diagnosis  Necrotizing pneumonia [facility]) [J85.0]   Discharge Diagnosis  Necrotizing pneumonia ([place]) [J85.0]    Active Problems:   Hemoptysis      Past Medical History:  Diagnosis Date  . Anxiety   . Depression     Past Surgical History:  Procedure Laterality Date  . FLEXIBLE BRONCHOSCOPY N/A 11/11/2017   Procedure: FLEXIBLE BRONCHOSCOPY;  Surgeon: Allyne Gee, MD;  Location: [facility] ORS;  Service: Pulmonary;  Laterality: N/A;  . none         History of present illness and  Hospital Course:     Kindly see H&P for history of present illness and admission details, please review complete Labs, Consult reports and Test reports for all details in brief  HPI  from the history and physical done on the day of admission  62 year old male patient admitted for coughing of blood, blood in the stool.  Hospital Course   #1 hemoptysis: Patient had a history of necrotizing pneumonia, heavy alcohol abuse.  Previously admitted and discharged from hospital after getting 21 days of Augmentin in February.  This time patient admitted for pneumonia, he also had weight loss so bronchoscopy was done by Dr. Devona Konig.  Patient bronchoscopy showed right mainstem bronchus mass, biopsies were done.  Patient also had bronchial alveolar lavage.  Biopsy results are pending.  So far BAL cultures are negative.  Patient seen by Dr. Ola Spurr and also Dr. Grayland Ormond.  Dr. Ola Spurr recommended 21 days of Augmentin to initiate a  course. 2.  Weight loss, hemoptysis, patient work-up for TB negative in February 2019.  Patient had shortness of breath, hemoptysis, persistent CT findings, bronchoscopy showing right main bronchus mass, patient seen by Dr. Grayland Ormond he recommended follow-up with him in the office next week Wednesday or Thursday to do PET scan, finished work-up for staging, follow-up of biopsy results.  Patient advised to call the office tomorrow and provided the office number.  3.Severe malnutrition in the context of chronic illness: Seen by dietitian, recommended Ensure.  Advised the patient to continue Ensure.   Discharge Condition: Stable   Follow UP      Discharge Instructions  and  Discharge Medications      Allergies as of 11/13/2017   No Known Allergies     Medication List    TAKE these medications   amoxicillin-clavulanate 875-125 MG tablet Commonly known as:  AUGMENTIN Take 1 tablet by mouth 2 (two) times daily for 21 days.   feeding supplement (ENSURE ENLIVE) Liqd Take 237 mLs by mouth 3 (three) times daily between meals.   thiamine 100 MG tablet Take 1 tablet (100 mg total) by mouth daily.         Diet and Activity recommendation: See Discharge Instructions above   Consults obtained -ID, pulmonary, oncology   Major procedures and Radiology Reports - PLEASE review detailed and final reports for all details, in brief -      Dg Chest 2 View  Result Date: 11/09/2017 CLINICAL DATA:  Hemoptysis 2 days ago. Also dark blood in stools. Diagnosed with pneumonia 3 months ago. Current smoker. History of necrotizing pneumonia and malnutrition.  EXAM: CHEST - 2 VIEW COMPARISON:  CT scan of the chest of August 09, 2017 and PA and lateral chest x-ray of the same day. FINDINGS: There is persistent abnormal heterogeneous opacity in the right upper lobe. There is pleural thickening. The appearance of the abnormality is somewhat less prominent today. Elsewhere the right lung is clear as  is the left lung. The heart and pulmonary vascularity are normal. The bony thorax exhibits no acute abnormality. IMPRESSION: Slightly decreased conspicuity of consolidation in the right upper lobe superiorly. This may reflect incomplete clearing of the suspected necrotizing pneumonia. However, atypical infection or malignancy are not excluded. Repeat chest CT scanning is recommended. Electronically Signed   By: David  Martinique M.D.   On: 11/09/2017 12:35   Ct Chest W Contrast  Result Date: 11/09/2017 CLINICAL DATA:  Hemoptysis. Non resolving right upper lobe opacity on recent chest radiograph. EXAM: CT CHEST WITH CONTRAST TECHNIQUE: Multidetector CT imaging of the chest was performed during intravenous contrast administration. CONTRAST:  22m ISOVUE-370 IOPAMIDOL (ISOVUE-370) INJECTION 76% COMPARISON:  Chest radiograph on 11/09/2017, and chest CT on 08/09/2017 FINDINGS: Cardiovascular: No acute findings. Aortic and coronary artery atherosclerosis. Mediastinum/Nodes: Mediastinal lymphadenopathy is again seen throughout the right paratracheal, pretracheal, and subcarinal regions, and to lesser degree the AP window. Largest area in the precarinal region measures 2.6 cm on image 50/2, without significant change compared to prior study. Mild right hilar lymphadenopathy shows no significant change, with soft tissue density encasing the right mainstem bronchus and causing stenosis of the central right upper lobe bronchus. Lungs/Pleura: Persistent right upper lobe atelectasis is seen, with central bronchiectasis and larger cystic areas in the right lung apex. A rounded opacity is seen within 1 of these cystic areas in the medial right lung apex measuring 2.3 x 3.3 cm on image 26/2. This is increased in size compared to previous study and is suspicious for mycetoma, although neoplasm cannot definitely be excluded. Previously seen area of consolidation in the superior and medial aspect of the right lower lobe has resolved  since previous study. New peribronchovascular nodular opacities are seen throughout the right lower lobe, most consistent with inflammatory or infectious etiology. No evidence of pleural effusion. Left lung remains clear. Upper Abdomen:  Unremarkable. Musculoskeletal:  No suspicious bone lesions. IMPRESSION: Persistent right upper lobe atelectasis with bronchiectasis and cystic lucencies. Increased size of 3 cm nodular density within right apical cystic lucency is suspicious for mycetoma, although neoplasm cannot definitely be excluded. Mediastinal and right hilar lymphadenopathy without significant change. Narrowing of central right upper lobe bronchus and bronchus intermedius again seen. Malignancy cannot be excluded. Consider bronchoscopy and EBUS for further evaluation. New peribronchovascular nodular opacities in right lower lobe, consistent with inflammatory or infectious etiology. No evidence of pleural effusion. Aortic Atherosclerosis (ICD10-I70.0). Coronary artery calcification. Electronically Signed   By: JEarle GellM.D.   On: 11/09/2017 14:57    Micro Results     Recent Results (from the past 240 hour(s))  Culture, expectorated sputum-assessment     Status: None   Collection Time: 11/09/17  1:00 AM  Result Value Ref Range Status   Specimen Description SPUTUM  Final   Special Requests Normal  Final   Sputum evaluation   Final    THIS SPECIMEN IS ACCEPTABLE FOR SPUTUM CULTURE Performed at [facility] [address] [place] [address]   Report Status 11/10/2017 FINAL  Final  Culture, respiratory (NON-Expectorated)     Status: None   Collection Time: 11/09/17  1:00 AM  Result Value Ref Range Status   Specimen Description   Final    SPUTUM Performed at [facility], [address] [place], [place] [address]    Special Requests   Final    Normal Reflexed from [id number] Performed at [facility], [place], [place] [address]     Gram Stain   Final    FEW WBC PRESENT, PREDOMINANTLY PMN RARE GRAM POSITIVE COCCI IN PAIRS RARE GRAM NEGATIVE RODS    Culture   Final    Consistent with normal respiratory flora. Performed at [facility], [place] [address] [place], [place] [address]    Report Status 11/12/2017 FINAL  Final  Blood culture (routine x 2)     Status: None (Preliminary result)   Collection Time: 11/09/17 12:48 PM  Result Value Ref Range Status   Specimen Description BLOOD LEFT ANTECUBITAL  Final   Special Requests   Final    BOTTLES DRAWN AEROBIC AND ANAEROBIC Blood Culture results may not be optimal due to an inadequate volume of blood received in culture bottles   Culture   Final    NO GROWTH 4 DAYS Performed at [facility], [address], [place], [place] [address]    Report Status PENDING  Incomplete  Blood culture (routine x 2)     Status: None (Preliminary result)   Collection Time: 11/09/17 12:53 PM  Result Value Ref Range Status   Specimen Description BLOOD BLOOD LEFT ARM  Final   Special Requests   Final    BOTTLES DRAWN AEROBIC AND ANAEROBIC Blood Culture adequate volume   Culture   Final    NO GROWTH 4 DAYS Performed at [facility], [address], [place], [place] [address]    Report Status PENDING  Incomplete  MRSA PCR Screening     Status: None   Collection Time: 11/09/17  4:45 PM  Result Value Ref Range Status   MRSA by PCR NEGATIVE NEGATIVE Final    Comment:        The GeneXpert MRSA Assay (FDA approved for NASAL specimens only), is one component of a comprehensive MRSA colonization surveillance program. It is not intended to diagnose MRSA infection nor to guide or monitor treatment for MRSA infections. Performed at [facility], [address] [place], [place] [address]   Respiratory Panel by PCR     Status: None   Collection Time: 11/09/17  4:45 PM  Result Value Ref Range Status   Adenovirus NOT DETECTED NOT DETECTED Final    Coronavirus 229E NOT DETECTED NOT DETECTED Final   Coronavirus HKU1 NOT DETECTED NOT DETECTED Final   Coronavirus NL63 NOT DETECTED NOT DETECTED Final   Coronavirus OC43 NOT DETECTED NOT DETECTED Final   Metapneumovirus NOT DETECTED NOT DETECTED Final   Rhinovirus / Enterovirus NOT DETECTED NOT DETECTED Final   Influenza A NOT DETECTED NOT DETECTED Final   Influenza B NOT DETECTED NOT DETECTED Final   Parainfluenza Virus 1 NOT DETECTED NOT DETECTED Final   Parainfluenza Virus 2 NOT DETECTED NOT DETECTED Final   Parainfluenza Virus 3 NOT DETECTED NOT DETECTED Final   Parainfluenza Virus 4 NOT DETECTED NOT DETECTED Final   Respiratory Syncytial Virus NOT DETECTED NOT DETECTED Final   Bordetella pertussis NOT DETECTED NOT DETECTED Final   Chlamydophila pneumoniae NOT DETECTED NOT DETECTED Final   Mycoplasma pneumoniae NOT DETECTED NOT DETECTED Final    Comment: Performed at [facility], [address] [place], [place] [address]  Culture, fungus without smear     Status: None (Preliminary result)   Collection Time: 11/11/17  9:56 AM  Result Value Ref Range Status   Specimen Description   Final    LUNG RIGHT Performed at [facility], [place] [address], [place], [place] [address]    Special Requests   Final    NONE Performed at [facility], [address] [place], [place] [address]    Culture   Final    TOO YOUNG TO READ Performed at [facility], [place] [address], [place], [place] [address]    Report Status PENDING  Incomplete  Culture, respiratory (NON-Expectorated)     Status: None (Preliminary result)   Collection Time: 11/11/17  9:56 AM  Result Value Ref Range Status   Specimen Description   Final    BRONCHIAL ALVEOLAR LAVAGE Performed at [facility], [address], [place], [place] [address]    Special Requests   Final    NONE Performed at [facility], [place], [place] [address]    Gram Stain NO WBC  SEEN NO ORGANISMS SEEN   Final   Culture   Final    FEW GRAM NEGATIVE RODS IDENTIFICATION AND SUSCEPTIBILITIES TO FOLLOW Performed at [facility], [place] [address], [place], [place] [address]    Report Status PENDING  Incomplete  Acid Fast Smear (AFB)     Status: None   Collection Time: 11/11/17  9:56 AM  Result Value Ref Range Status   AFB Specimen Processing Concentration  Final   Acid Fast Smear Negative  Final    Comment: (NOTE) Performed At: [facility] [address], [place] [id number] Rush Farmer MD SW:[id number]    Source (AFB) BRONCHIAL ALVEOLAR LAVAGE  Final    Comment: Performed at [facility], [address], [place],  [address]       Today   Subjective:   Wayne Chapman today has no headache,no chest abdominal pain,no new weakness tingling or numbness, feels much better wants to go home today.   Objective:   Blood pressure 98/63, pulse (!) 52, temperature 98 F (36.7 C), temperature source Oral, resp. rate 15, height 6' 1.5" (1.867 m), weight 60.9 kg (134 lb 4.8 oz), SpO2 99 %.   Intake/Output Summary (Last 24 hours) at 11/13/2017 1048 Last data filed at 11/13/2017 1032 Gross per 24 hour  Intake 240 ml  Output 600 ml  Net -360 ml    Exam Awake Alert, Oriented x 3, No new F.N deficits, Normal affect .AT,PERRAL Supple Neck,No JVD, No cervical lymphadenopathy appriciated.  Symmetrical Chest wall movement, Good air movement bilaterally, CTAB RRR,No Gallops,Rubs or new Murmurs, No Parasternal Heave +ve B.Sounds, Abd Soft, Non tender, No organomegaly appriciated, No rebound -guarding or rigidity. No Cyanosis, Clubbing or edema, No new Rash or bruise  Data Review   CBC w Diff:  Lab Results  Component Value Date   WBC 6.7 11/09/2017   HGB 14.3 11/09/2017   HGB 15.0 07/07/2014   HCT 44.1 11/09/2017   HCT 46.5 07/07/2014   PLT 194 11/09/2017   PLT 208 07/07/2014   LYMPHOPCT 11 08/09/2017   MONOPCT 18  08/09/2017   EOSPCT 0 08/09/2017   BASOPCT 0 08/09/2017    CMP:  Lab Results  Component Value Date   NA 133 (L) 11/11/2017   NA 142 07/07/2014   K 3.8 11/11/2017  K 4.2 07/07/2014   CL 102 11/11/2017   CL 108 (H) 07/07/2014   CO2 21 (L) 11/11/2017   CO2 25 07/07/2014   BUN 6 11/11/2017   BUN 7 07/07/2014   CREATININE 0.59 (L) 11/11/2017   CREATININE 0.96 07/07/2014   PROT 7.8 08/09/2017   PROT 8.4 (H) 07/07/2014   ALBUMIN 3.1 (L) 08/09/2017   ALBUMIN 4.0 07/07/2014   BILITOT 0.8 08/09/2017   BILITOT 0.3 07/07/2014   ALKPHOS 65 08/09/2017   ALKPHOS 70 07/07/2014   AST 26 08/09/2017   AST 58 (H) 07/07/2014   ALT 13 (L) 08/09/2017   ALT 35 07/07/2014  .   Total Time in preparing paper work, data evaluation and todays exam - 35 minutes  Epifanio Lesches M.D on 11/13/2017 at 10:48 AM    Note: This dictation was prepared with Dragon dictation along with smaller phrase technology. Any transcriptional errors that result from this process are unintentional.

## 2017-11-13 NOTE — Care Management (Signed)
Discharge to home today per Dr. Vianne Bulls. No primary care per Mr. Siedlecki. Will give information on physicians accepting new patient,  Appointment with Dr. Grayland Ormond scheduled for next week.  Will give Medication Management Application. States no transportation.  [employer]/gallery curator. Shelbie Ammons RN MSN CCM Care Management [phone]

## 2017-11-13 NOTE — Progress Notes (Signed)
Pt D/C to home by [employer] taxi with voucher. IV removed intact. VSS. Education completed. Questions answered. D/C paper signed and put in chart.

## 2017-11-14 ENCOUNTER — Other Ambulatory Visit: Payer: Self-pay

## 2017-11-14 ENCOUNTER — Encounter: Payer: Self-pay | Admitting: *Deleted

## 2017-11-14 ENCOUNTER — Telehealth: Payer: Self-pay | Admitting: Infectious Diseases

## 2017-11-14 DIAGNOSIS — R918 Other nonspecific abnormal finding of lung field: Secondary | ICD-10-CM

## 2017-11-14 LAB — CULTURE, BLOOD (ROUTINE X 2)
CULTURE: NO GROWTH
CULTURE: NO GROWTH
SPECIAL REQUESTS: ADEQUATE

## 2017-11-14 MED ORDER — CEPHALEXIN 500 MG PO CAPS: 500.0000 mg | ORAL_CAPSULE | Freq: Four times a day (QID) | ORAL | 0 refills | Status: DC

## 2017-11-14 NOTE — Telephone Encounter (Signed)
-----   Message from Tretha Sciara, [facility] sent at 11/14/2017  9:34 AM EDT ----- Regarding: resp culture Hi Dave,  Looks like this guy grew out [facility] and sent out on augmentin.  Isolate is intermediate, so I recommend changing to a cephalosporin.  Ysidro Evert

## 2017-11-14 NOTE — Telephone Encounter (Signed)
Called pt and spoke with him. Advised to pick up keflex sent into pharmacy and to stop aumgnetin He has agreed

## 2017-11-14 NOTE — Progress Notes (Signed)
  Oncology Nurse Navigator Documentation  Navigator Location: [facility] (11/14/17 1300)   )Navigator Encounter Type: Introductory phone call;Telephone (11/14/17 1300) Telephone: Lahoma Crocker Call;Appt Confirmation/Clarification (11/14/17 1300) Abnormal Finding Date: 11/09/17 (11/14/17 1300)                     Barriers/Navigation Needs: Coordination of Care (11/14/17 1300)   Interventions: Coordination of Care (11/14/17 1300)   Coordination of Care: Appts;Radiology (11/14/17 1300)        Acuity: Level 2 (11/14/17 1300)   Acuity Level 2: Initial guidance, education and coordination as needed;Educational needs;Assistance expediting appointments (11/14/17 1300)  phone call made to patient to introduce to navigator services. All questions answered during phone call. Reviewed upcoming appts for PET on 5/22 and hospital follow up with Dr. Grayland Ormond on 5/23. Pt confirmed both appts. Nothing further needed at this time. Encouraged pt to call with any further questions or concerns. Pt verbalized understanding.    Time Spent with Patient: 30 (11/14/17 1300)

## 2017-11-15 LAB — CYTOLOGY - NON PAP

## 2017-11-16 ENCOUNTER — Encounter
Admission: RE | Admit: 2017-11-16 | Discharge: 2017-11-16 | Disposition: A | Discharge status: Home / Self Care | Payer: Medicare Other | Source: Ambulatory Visit | Attending: Oncology | Admitting: Oncology

## 2017-11-16 ENCOUNTER — Other Ambulatory Visit: Payer: Self-pay | Admitting: Pathology

## 2017-11-16 DIAGNOSIS — R918 Other nonspecific abnormal finding of lung field: Secondary | ICD-10-CM | POA: Insufficient documentation

## 2017-11-16 LAB — SURGICAL PATHOLOGY

## 2017-11-16 LAB — GLUCOSE, CAPILLARY: Glucose-Capillary: 108 mg/dL — ABNORMAL HIGH (ref 65–99)

## 2017-11-16 LAB — CYTOLOGY - NON PAP

## 2017-11-16 MED ORDER — FLUDEOXYGLUCOSE F - 18 (FDG) INJECTION
7.1100 | Freq: Once | INTRAVENOUS | Status: AC | PRN
  Administered 2017-11-16: 7.11 via INTRAVENOUS

## 2017-11-17 ENCOUNTER — Other Ambulatory Visit: Payer: Self-pay

## 2017-11-17 ENCOUNTER — Encounter: Payer: Self-pay | Admitting: *Deleted

## 2017-11-17 ENCOUNTER — Inpatient Hospital Stay: Payer: Medicare Other | Attending: Oncology | Admitting: Oncology

## 2017-11-17 ENCOUNTER — Encounter: Payer: Self-pay | Admitting: Oncology

## 2017-11-17 VITALS — BP 121/76 | HR 92 | Temp 97.2°F | Resp 12 | Ht 73.5 in | Wt 131.0 lb

## 2017-11-17 DIAGNOSIS — C3411 Malignant neoplasm of upper lobe, right bronchus or lung: Secondary | ICD-10-CM | POA: Diagnosis present

## 2017-11-17 DIAGNOSIS — N2889 Other specified disorders of kidney and ureter: Secondary | ICD-10-CM | POA: Diagnosis not present

## 2017-11-17 DIAGNOSIS — Z006 Encounter for examination for normal comparison and control in clinical research program: Secondary | ICD-10-CM | POA: Insufficient documentation

## 2017-11-17 DIAGNOSIS — C3490 Malignant neoplasm of unspecified part of unspecified bronchus or lung: Secondary | ICD-10-CM

## 2017-11-17 DIAGNOSIS — F17210 Nicotine dependence, cigarettes, uncomplicated: Secondary | ICD-10-CM

## 2017-11-17 DIAGNOSIS — R634 Abnormal weight loss: Secondary | ICD-10-CM | POA: Diagnosis not present

## 2017-11-17 DIAGNOSIS — C3491 Malignant neoplasm of unspecified part of right bronchus or lung: Secondary | ICD-10-CM | POA: Insufficient documentation

## 2017-11-17 MED ORDER — CEPHALEXIN 500 MG PO CAPS: 500.0000 mg | ORAL_CAPSULE | Freq: Four times a day (QID) | ORAL | 0 refills | Status: AC

## 2017-11-18 ENCOUNTER — Encounter: Payer: Self-pay | Admitting: *Deleted

## 2017-11-18 ENCOUNTER — Ambulatory Visit
Admission: RE | Admit: 2017-11-18 | Discharge: 2017-11-18 | Disposition: A | Discharge status: Home / Self Care | Payer: Medicare Other | Source: Ambulatory Visit | Attending: Radiation Oncology | Admitting: Radiation Oncology

## 2017-11-18 ENCOUNTER — Encounter: Payer: Self-pay | Admitting: Radiation Oncology

## 2017-11-18 ENCOUNTER — Telehealth: Payer: Self-pay | Admitting: Oncology

## 2017-11-18 ENCOUNTER — Inpatient Hospital Stay: Payer: Medicare Other

## 2017-11-18 ENCOUNTER — Other Ambulatory Visit: Payer: Self-pay

## 2017-11-18 VITALS — BP 103/67 | HR 87 | Temp 97.1°F | Resp 18 | Wt 132.5 lb

## 2017-11-18 DIAGNOSIS — F418 Other specified anxiety disorders: Secondary | ICD-10-CM | POA: Diagnosis not present

## 2017-11-18 DIAGNOSIS — F1020 Alcohol dependence, uncomplicated: Secondary | ICD-10-CM | POA: Insufficient documentation

## 2017-11-18 DIAGNOSIS — Z79899 Other long term (current) drug therapy: Secondary | ICD-10-CM | POA: Diagnosis not present

## 2017-11-18 DIAGNOSIS — N2889 Other specified disorders of kidney and ureter: Secondary | ICD-10-CM | POA: Insufficient documentation

## 2017-11-18 DIAGNOSIS — F17210 Nicotine dependence, cigarettes, uncomplicated: Secondary | ICD-10-CM | POA: Diagnosis not present

## 2017-11-18 DIAGNOSIS — R64 Cachexia: Secondary | ICD-10-CM | POA: Insufficient documentation

## 2017-11-18 DIAGNOSIS — C3491 Malignant neoplasm of unspecified part of right bronchus or lung: Secondary | ICD-10-CM

## 2017-11-18 DIAGNOSIS — C3481 Malignant neoplasm of overlapping sites of right bronchus and lung: Secondary | ICD-10-CM | POA: Insufficient documentation

## 2017-11-18 NOTE — Progress Notes (Signed)
  Oncology Nurse Navigator Documentation  Navigator Location: [facility] (11/18/17 1300)   )Navigator Encounter Type: Initial RadOnc (11/18/17 1300)                       Treatment Phase: Pre-Tx/Tx Discussion (11/18/17 1300) Barriers/Navigation Needs: Transportation;Coordination of Care (11/18/17 1300)   Interventions: Coordination of Care;Transportation (11/18/17 1300)   Coordination of Care: Appts;Radiology (11/18/17 1300)         met with patient during initial rad-onc consultation with Dr. Baruch Gouty. All questions answered at the time of visit. Pt states that needs assistance with transportation. Informed pt that will add him to the [place] schedule for upcoming appts. Educated pt that the [facility] will not be able to transport him the day of PAC placement and that he will need to find transportation that day. Pt informed that will be given updated appts with [facility] pick up times at next appt for CT sim on 5/29 at 3pm. Pt verbalized understanding and did not voice any further needs. Will follow up again with pt next week on 5/29.         Time Spent with Patient: 30 (11/18/17 1300)

## 2017-11-18 NOTE — Progress Notes (Signed)
  Oncology Nurse Navigator Documentation  Navigator Location: [facility] (11/17/17 1600)   )Navigator Encounter Type: Follow-up Appt (11/17/17 1600)     Confirmed Diagnosis Date: 11/16/17 (11/17/17 1600)               Patient Visit Type: MedOnc (11/17/17 1600) Treatment Phase: Pre-Tx/Tx Discussion (11/17/17 1600) Barriers/Navigation Needs: Coordination of Care;Education;Transportation (11/17/17 1600) Education: Understanding Cancer/ Treatment Options;Newly Diagnosed Cancer Education (11/17/17 1600) Interventions: Coordination of Care;Education;Medication Assistance;Transportation (11/17/17 1600)   Coordination of Care: Appts;Radiology;Chemo (11/17/17 1600) Education Method: Verbal;Written (11/17/17 1600)      Acuity: Level 3 (11/17/17 1600)     Acuity Level 3: Transportation issues;Ongoing guidance and education provided throughout treatment (11/17/17 1600)  met with patient during hospital follow up with Dr. Grayland Ormond to review pathology results and recent PET scan results. All questions answered at the time of visit. Reviewed with patient upcoming appts. Pt states that has had difficulty picking up prescription after hospital discharge due to finances. Pt informed that we can assist with paying for his medications. Prescription sent to [facility] per pt request. Liliane Channel at [facility] aware and will have prescription ready for pick up. Pt given resources regarding diagnosis and support services available. Contact info given and instructed pt to call with any further questions or needs. Pt verbalized understanding. Time Spent with Patient: 60 (11/17/17 1600)

## 2017-11-18 NOTE — Consult Note (Signed)
NEW PATIENT EVALUATION  Name: Wayne Chapman  MRN: [id number]  Date:   11/18/2017     DOB: 11-22-1955   This 62 y.o. 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 Lung Cancer    Pt is here for initial consultation of lung cancer    DIAGNOSIS: The encounter diagnosis was Squamous cell lung cancer, right ([place]).   PREVIOUS INVESTIGATIONS:  CT scans and PET/CT scans reviewed Clinical notes reviewed Pathology reports reviewed Case presented at weekly tumor conference  HPI: patient is a 62 year old malewith past medical history significant for necrotizing pneumonia and chronic alcoholism who was noted to have a mass in his right upper lobe by CT criteria suspicious for malignancy. [place]kowski B showed a right mainstem mass with biopsy positive for non-small cell lung cancer favoring squamous cell carcinoma.PET CT scan showed large necrotic right apical lung mass invading the right hilum and mediastinum making this a T4 lesion.there was also a 1.2 cm hypermetabolic right supraclavicular node making this and N3 stage. He also had a 2.2 cm right hypermetabolic renal lesion suspicious for primary renal cell carcinoma. Patient is having some hemoptysis and cough. He has lost some weight is mildly cachectic. His case was presented at our weekly tumor conference and recognition for concurrent chemoradiation therapy was made.At this time we will refer the patient to urology for evaluation of his renal lesion although may just can elect to observe that at this time. He is seen today for radiation oncology opinion.  PLANNED TREATMENT REGIMEN: split course radiation therapy with concurrent chemotherapy to his right upper lobe right supraclavicular node.  PAST MEDICAL HISTORY:  has a  past medical history of Anxiety and Depression.    PAST SURGICAL HISTORY:  Past Surgical History:  Procedure Laterality Date  . FLEXIBLE BRONCHOSCOPY N/A 11/11/2017   Procedure: FLEXIBLE BRONCHOSCOPY;  Surgeon: Allyne Gee, MD;  Location: [facility] ORS;  Service: Pulmonary;  Laterality: N/A;  . none      FAMILY HISTORY: family history includes Diabetes Mellitus II in his mother.  SOCIAL HISTORY:  reports that he has been smoking cigarettes.  He has been smoking about 1.00 pack per day. He has never used smokeless tobacco. He reports that he drinks alcohol. He reports that he has current or past drug history.  ALLERGIES: Patient has no known allergies.  MEDICATIONS:  Current Outpatient Medications  Medication Sig Dispense Refill  . cephALEXin (KEFLEX) 500 MG capsule Take 1 capsule (500 mg total) by mouth 4 (four) times daily for 7 days. 28 capsule 0  . feeding supplement, ENSURE ENLIVE, (ENSURE ENLIVE) LIQD Take 237 mLs by mouth 3 (three) times daily between meals. 237 mL 12  . thiamine 100 MG tablet Take 1 tablet (100 mg total) by mouth daily. 30 tablet 0   No current facility-administered medications for this encounter.     ECOG PERFORMANCE STATUS:  1 - Symptomatic but completely ambulatory  REVIEW OF SYSTEMS: except for the hemoptysis cough or dyspnea on exertion and weight loss Patient denies any weight loss, fatigue, weakness, fever, chills or night sweats. Patient denies any loss of vision, blurred vision. Patient denies any ringing  of the ears or hearing loss. No irregular heartbeat. Patient denies heart murmur or history of fainting. Patient denies any chest  pain or pain radiating to her upper extremities. Patient denies any shortness of breath, difficulty breathing at night, cough or hemoptysis. Patient denies any swelling in the lower legs. Patient denies any nausea vomiting, vomiting of blood, or coffee ground material in the vomitus. Patient denies any stomach pain. Patient  states has had normal bowel movements no significant constipation or diarrhea. Patient denies any dysuria, hematuria or significant nocturia. Patient denies any problems walking, swelling in the joints or loss of balance. Patient denies any skin changes, loss of hair or loss of weight. Patient denies any excessive worrying or anxiety or significant depression. Patient denies any problems with insomnia. Patient denies excessive thirst, polyuria, polydipsia. Patient denies any swollen glands, patient denies easy bruising or easy bleeding. Patient denies any recent infections, allergies or URI. Patient "s visual fields have not changed significantly in recent time.    PHYSICAL EXAM: BP 103/67   Pulse 87   Temp (!) 97.1 F (36.2 C)   Resp 18   Wt 132 lb 7.9 oz (60.1 kg)   BMI 17.24 kg/m  hink Actiq male in NAD. He has decreased breath sounds in the right upper lobe Well-developed well-nourished patient in NAD. HEENT reveals PERLA, EOMI, discs not visualized.  Oral cavity is clear. No oral mucosal lesions are identified. Neck is clear without evidence of cervical or supraclavicular adenopathy. Lungs are clear to A&P. Cardiac examination is essentially unremarkable with regular rate and rhythm without murmur rub or thrill. Abdomen is benign with no organomegaly or masses noted. Motor sensory and DTR levels are equal and symmetric in the upper and lower extremities. Cranial nerves II through XII are grossly intact. Proprioception is intact. No peripheral adenopathy or edema is identified. No motor or sensory levels are noted. Crude visual fields are within normal range.  LABORATORY DATA: pathology reports reviewed    RADIOLOGY RESULTS:PET CT scan and CT scans reviewed MRI of his brain is pending   IMPRESSION: staged IIIB squamous cell carcinoma the right upper lobe in 62 year old male for concurrent chemoradiation  PLAN: at this time I would recommend a split course of radiation therapy. I would  initially treat up to 4000 cGy using three-dimensional treatment planning. I would opt for split course therapy based on its close proximity to the esophagus right supraclavicular node involvement. I will put the patient 1 week break after initial radiation therapyand adjust fields accordingly based on tumor response. Risks and benefits of radiation including increased cough possible radiation esophagitis fatigue alteration of blood counts skin reaction all were discussed in detail with the patient and his wife.There will be extra effort by both professional staff as well as technical staff to coordinate and manage concurrent chemoradiation and ensuing side effects during his treatments.I have personally 7 ordered CT simulation for next week. Will review his MRI of his brain when it becomes available. Patient and wife both comprehend my treatment plan well.  I would like to take this opportunity to thank you for allowing me to participate in the care of your patient.Noreene Filbert, MD

## 2017-11-18 NOTE — Telephone Encounter (Signed)
Can not get MRI moved up they have nothing unless someone cancels, next available is 6/7 if we move the one on the 4th. Sent Hailey a in basket on what to do next.

## 2017-11-23 ENCOUNTER — Ambulatory Visit
Admission: RE | Admit: 2017-11-23 | Discharge: 2017-11-23 | Disposition: A | Discharge status: Home / Self Care | Payer: Medicare Other | Source: Ambulatory Visit | Attending: Radiation Oncology | Admitting: Radiation Oncology

## 2017-11-23 DIAGNOSIS — C3481 Malignant neoplasm of overlapping sites of right bronchus and lung: Secondary | ICD-10-CM | POA: Diagnosis not present

## 2017-11-23 MED ORDER — LIDOCAINE-PRILOCAINE 2.5-2.5 % EX CREA: TOPICAL_CREAM | CUTANEOUS | 3 refills | Status: DC

## 2017-11-23 MED ORDER — ONDANSETRON HCL 8 MG PO TABS: 8.0000 mg | ORAL_TABLET | Freq: Two times a day (BID) | ORAL | 2 refills | Status: DC | PRN

## 2017-11-23 MED ORDER — PROCHLORPERAZINE MALEATE 10 MG PO TABS: 10.0000 mg | ORAL_TABLET | Freq: Four times a day (QID) | ORAL | 2 refills | Status: DC | PRN

## 2017-11-23 NOTE — Progress Notes (Signed)
START ON PATHWAY REGIMEN - Non-Small Cell Lung     Administer weekly:     Paclitaxel      Carboplatin   **Always confirm dose/schedule in your pharmacy ordering system**    Patient Characteristics: Stage III - Unresectable, PS = 0, 1 AJCC T Category: T4 Current Disease Status: No Distant Mets or Local Recurrence AJCC N Category: N3 AJCC M Category: M0 AJCC 8 Stage Grouping: IIIC Performance Status: PS = 0, 1 Intent of Therapy: Curative Intent, Discussed with Patient

## 2017-11-23 NOTE — Progress Notes (Signed)
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  INTERVAL HISTORY: Patient is a 62 year old male who was initially evaluated in the hospital who underwent bronchoscopy and PET scan revealing the above-stated malignancy.  He currently feels well and is back to his baseline.  He has no neurologic complaints.  He denies any recent fevers.  He has a good appetite, but admits to weight loss.  He is unclear how much.  He denies any chest pain, shortness of breath, cough, or hemoptysis.  He has no nausea, vomiting, constipation, or diarrhea.  He has no urinary complaints.  Patient offers no no specific complaints today.  REVIEW OF SYSTEMS:   Review of Systems  Constitutional: Positive for weight loss. Negative for fever and malaise/fatigue.  Respiratory: Negative.  Negative for cough, hemoptysis and shortness of breath.   Cardiovascular: Negative.  Negative for chest pain and leg swelling.  Gastrointestinal: Negative.  Negative for abdominal pain and constipation.  Genitourinary: Negative.  Negative for dysuria.  Musculoskeletal: Negative.   Skin: Negative.  Negative for rash.  Neurological: Negative.  Negative for sensory change, focal weakness, weakness and headaches.  Psychiatric/Behavioral: Negative.  The patient is not nervous/anxious.     As per HPI. Otherwise, a complete review of systems is negative.  PAST MEDICAL HISTORY: Past Medical History:  Diagnosis Date  . Anxiety   . Depression     PAST SURGICAL HISTORY: Past Surgical History:  Procedure Laterality Date  . FLEXIBLE BRONCHOSCOPY N/A 11/11/2017   Procedure: FLEXIBLE BRONCHOSCOPY;  Surgeon: Allyne Gee, MD;  Location: [facility]  ORS;  Service: Pulmonary;  Laterality: N/A;  . none      FAMILY HISTORY: Family History  Problem Relation Age of Onset  . Diabetes Mellitus II Mother     ADVANCED DIRECTIVES (Y/N):  N  HEALTH MAINTENANCE: Social History   Tobacco Use  . Smoking status: Current Every Day Smoker    Packs/day: 1.00    Types: Cigarettes  . Smokeless tobacco: Never Used  Substance Use Topics  . Alcohol use: Yes    Comment: alot   . Drug use: Yes     Colonoscopy:  PAP:  Bone density:  Lipid panel:  No Known Allergies  Current Outpatient Medications  Medication Sig Dispense Refill  . cephALEXin (KEFLEX) 500 MG capsule Take 1 capsule (500 mg total) by mouth 4 (four) times daily for 7 days. 28 capsule 0  . feeding supplement, ENSURE ENLIVE, (ENSURE ENLIVE) LIQD Take 237 mLs by mouth 3 (three) times daily between meals. 237 mL 12  . thiamine 100 MG tablet Take 1 tablet (100 mg total) by mouth daily. 30 tablet 0   No current facility-administered medications for this visit.     OBJECTIVE: Vitals:   11/17/17 1537 11/17/17 1540  BP:  121/76  Pulse:  92  Resp: 12   Temp:  (!) 97.2 F (36.2 C)     Body mass index is 17.05 kg/m.    ECOG FS:0 - Asymptomatic  General: Well-developed, well-nourished, no acute distress. Eyes: Pink conjunctiva, anicteric sclera. HEENT: Normocephalic, moist mucous membranes, clear oropharnyx. Lungs: Clear to auscultation bilaterally. Heart: Regular rate and rhythm. No rubs, murmurs, or gallops. Abdomen: Soft, nontender, nondistended. No organomegaly  noted, normoactive bowel sounds. Musculoskeletal: No edema, cyanosis, or clubbing. Neuro: Alert, answering all questions appropriately. Cranial nerves grossly intact. Skin: No rashes or petechiae noted. Psych: Normal affect. Lymphatics: No cervical, calvicular, axillary or inguinal LAD.   LAB RESULTS:  Lab Results  Component Value Date   NA 133 (L) 11/11/2017   K 3.8 11/11/2017   CL 102 11/11/2017   CO2  21 (L) 11/11/2017   GLUCOSE 105 (H) 11/11/2017   BUN 6 11/11/2017   CREATININE 0.59 (L) 11/11/2017   CALCIUM 8.9 11/11/2017   PROT 7.8 08/09/2017   ALBUMIN 3.1 (L) 08/09/2017   AST 26 08/09/2017   ALT 13 (L) 08/09/2017   ALKPHOS 65 08/09/2017   BILITOT 0.8 08/09/2017   GFRNONAA >60 11/11/2017   GFRAA >60 11/11/2017    Lab Results  Component Value Date   WBC 6.7 11/09/2017   NEUTROABS 5.9 08/09/2017   HGB 14.3 11/09/2017   HCT 44.1 11/09/2017   MCV 77.5 (L) 11/09/2017   PLT 194 11/09/2017     STUDIES: Dg Chest 2 View  Result Date: 11/09/2017 CLINICAL DATA:  Hemoptysis 2 days ago. Also dark blood in stools. Diagnosed with pneumonia 3 months ago. Current smoker. History of necrotizing pneumonia and malnutrition. EXAM: CHEST - 2 VIEW COMPARISON:  CT scan of the chest of August 09, 2017 and PA and lateral chest x-ray of the same day. FINDINGS: There is persistent abnormal heterogeneous opacity in the right upper lobe. There is pleural thickening. The appearance of the abnormality is somewhat less prominent today. Elsewhere the right lung is clear as is the left lung. The heart and pulmonary vascularity are normal. The bony thorax exhibits no acute abnormality. IMPRESSION: Slightly decreased conspicuity of consolidation in the right upper lobe superiorly. This may reflect incomplete clearing of the suspected necrotizing pneumonia. However, atypical infection or malignancy are not excluded. Repeat chest CT scanning is recommended. Electronically Signed   By: David  Martinique M.D.   On: 11/09/2017 12:35   Ct Chest W Contrast  Result Date: 11/09/2017 CLINICAL DATA:  Hemoptysis. Non resolving right upper lobe opacity on recent chest radiograph. EXAM: CT CHEST WITH CONTRAST TECHNIQUE: Multidetector CT imaging of the chest was performed during intravenous contrast administration. CONTRAST:  53m ISOVUE-370 IOPAMIDOL (ISOVUE-370) INJECTION 76% COMPARISON:  Chest radiograph on 11/09/2017, and chest  CT on 08/09/2017 FINDINGS: Cardiovascular: No acute findings. Aortic and coronary artery atherosclerosis. Mediastinum/Nodes: Mediastinal lymphadenopathy is again seen throughout the right paratracheal, pretracheal, and subcarinal regions, and to lesser degree the AP window. Largest area in the precarinal region measures 2.6 cm on image 50/2, without significant change compared to prior study. Mild right hilar lymphadenopathy shows no significant change, with soft tissue density encasing the right mainstem bronchus and causing stenosis of the central right upper lobe bronchus. Lungs/Pleura: Persistent right upper lobe atelectasis is seen, with central bronchiectasis and larger cystic areas in the right lung apex. A rounded opacity is seen within 1 of these cystic areas in the medial right lung apex measuring 2.3 x 3.3 cm on image 26/2. This is increased in size compared to previous study and is suspicious for mycetoma, although neoplasm cannot definitely be excluded. Previously seen area of consolidation in the superior and medial aspect of the right lower lobe has resolved since previous study. New peribronchovascular nodular opacities are seen throughout the right lower lobe, most consistent with inflammatory or infectious etiology. No evidence of pleural effusion. Left lung remains clear. Upper Abdomen:  Unremarkable. Musculoskeletal:  No  suspicious bone lesions. IMPRESSION: Persistent right upper lobe atelectasis with bronchiectasis and cystic lucencies. Increased size of 3 cm nodular density within right apical cystic lucency is suspicious for mycetoma, although neoplasm cannot definitely be excluded. Mediastinal and right hilar lymphadenopathy without significant change. Narrowing of central right upper lobe bronchus and bronchus intermedius again seen. Malignancy cannot be excluded. Consider bronchoscopy and EBUS for further evaluation. New peribronchovascular nodular opacities in right lower lobe, consistent  with inflammatory or infectious etiology. No evidence of pleural effusion. Aortic Atherosclerosis (ICD10-I70.0). Coronary artery calcification. Electronically Signed   By: Earle Gell M.D.   On: 11/09/2017 14:57   Nm Pet Image Initial (pi) Skull Base To Thigh  Result Date: 11/16/2017 CLINICAL DATA:  Initial treatment strategy for right upper lobe lung mass. EXAM: NUCLEAR MEDICINE PET SKULL BASE TO THIGH TECHNIQUE: 7.1 mCi F-18 FDG was injected intravenously. Full-ring PET imaging was performed from the skull base to thigh after the radiotracer. CT data was obtained and used for attenuation correction and anatomic localization. Fasting blood glucose: 108 mg/dl COMPARISON:  Chest CT 11/09/2017 and 08/09/2017 FINDINGS: Mediastinal blood pool activity: SUV max 1.64 NECK: No hypermetabolic lymph nodes in the neck. Incidental CT findings: none CHEST: Large centrally necrotic right upper lobe lung mass invading the right hilum and mediastinum with marked hypermetabolism and SUV max of 20.3. Findings highly suspicious for neoplasm. 12 mm right supraclavicular lymph node is hypermetabolic with SUV max of 2.63 and may be a good target for ultrasound-guided biopsy. No pulmonary nodules suspicious for pulmonary metastatic disease. There is diffuse patchy tree-in-bud pattern consistent with an inflammatory process or atypical infection. Incidental CT findings: none ABDOMEN/PELVIS: There is a hypermetabolic right renal lesion measuring approximately 2.2 cm in the interpolar region anteriorly. SUV max is 14.9 and this is highly suspicious for a primary renal cell cancer. Metastatic disease would seem unlikely. No findings for hepatic or adrenal gland metastasis. Incidental CT findings: none SKELETON: No focal hypermetabolic activity to suggest skeletal metastasis. Incidental CT findings: none IMPRESSION: 1. Large necrotic right apical lung mass invading the right hilum and mediastinum. Marked hypermetabolism consistent with  neoplastic process. 2. 12 mm hypermetabolic supraclavicular lymph node on the right. This should be amenable to ultrasound-guided biopsy. 3. 2.2 cm right hypermetabolic renal lesion suspicious for primary renal cell carcinoma. MRI without and with contrast may be helpful for further evaluation. Electronically Signed   By: Marijo Sanes M.D.   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  PLAN:    1. 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  Final pathology results discussed at cancer conference as well as with pathologist.  Patient will benefit from concurrent chemotherapy with weekly carboplatin and Taxol along with daily XRT.  At the conclusion of his XRT, he will also receive maintenance Infinzi every 2 weeks for up to 12 months.  Will get an MRI of the brain to complete the staging work-up.  Patient will then return to clinic on December 05, 2017 for further evaluation and consideration of cycle 1 of carboplatinum and Taxol. 2.  Renal mass: Highly suspicious for renal cell carcinoma.  Case discussed with urology.  Will continue to monitor and consider possible nephrectomy at the conclusion of his treatments for his squamous cell carcinoma of lung.  Approximately 30 minutes spent in discussion of  which greater than 50% was consultation.  Patient expressed understanding and was in agreement with this plan. He also understands that He can call clinic at any time with any questions, concerns, or complaints.   Cancer Staging Squamous cell lung cancer, right ([place]) Staging form: Lung, AJCC 8th Edition - Clinical: Stage IIIC (cT4, cN3, cM0) - Signed by Lloyd Huger, MD on 11/23/2017   Lloyd Huger, MD   11/23/2017 9:55 AM

## 2017-11-24 ENCOUNTER — Other Ambulatory Visit (INDEPENDENT_AMBULATORY_CARE_PROVIDER_SITE_OTHER): Payer: Self-pay | Admitting: Vascular Surgery

## 2017-11-24 NOTE — Patient Instructions (Signed)

## 2017-11-28 ENCOUNTER — Inpatient Hospital Stay (HOSPITAL_BASED_OUTPATIENT_CLINIC_OR_DEPARTMENT_OTHER): Payer: Medicare Other | Admitting: Oncology

## 2017-11-28 ENCOUNTER — Inpatient Hospital Stay: Payer: Medicare Other | Attending: Oncology

## 2017-11-28 ENCOUNTER — Encounter: Payer: Self-pay | Admitting: Oncology

## 2017-11-28 ENCOUNTER — Inpatient Hospital Stay: Payer: Medicare Other | Admitting: Nurse Practitioner

## 2017-11-28 VITALS — BP 109/67 | HR 69 | Temp 98.0°F | Resp 16 | Wt 133.5 lb

## 2017-11-28 DIAGNOSIS — G8929 Other chronic pain: Secondary | ICD-10-CM | POA: Insufficient documentation

## 2017-11-28 DIAGNOSIS — F419 Anxiety disorder, unspecified: Secondary | ICD-10-CM | POA: Diagnosis not present

## 2017-11-28 DIAGNOSIS — Z510 Encounter for antineoplastic radiation therapy: Secondary | ICD-10-CM | POA: Diagnosis not present

## 2017-11-28 DIAGNOSIS — E871 Hypo-osmolality and hyponatremia: Secondary | ICD-10-CM | POA: Insufficient documentation

## 2017-11-28 DIAGNOSIS — R1310 Dysphagia, unspecified: Secondary | ICD-10-CM | POA: Insufficient documentation

## 2017-11-28 DIAGNOSIS — F329 Major depressive disorder, single episode, unspecified: Secondary | ICD-10-CM | POA: Insufficient documentation

## 2017-11-28 DIAGNOSIS — C3411 Malignant neoplasm of upper lobe, right bronchus or lung: Secondary | ICD-10-CM | POA: Diagnosis not present

## 2017-11-28 DIAGNOSIS — Z79899 Other long term (current) drug therapy: Secondary | ICD-10-CM | POA: Insufficient documentation

## 2017-11-28 DIAGNOSIS — Z5111 Encounter for antineoplastic chemotherapy: Secondary | ICD-10-CM | POA: Insufficient documentation

## 2017-11-28 DIAGNOSIS — F17210 Nicotine dependence, cigarettes, uncomplicated: Secondary | ICD-10-CM

## 2017-11-28 DIAGNOSIS — E46 Unspecified protein-calorie malnutrition: Secondary | ICD-10-CM | POA: Insufficient documentation

## 2017-11-28 DIAGNOSIS — E860 Dehydration: Secondary | ICD-10-CM | POA: Insufficient documentation

## 2017-11-28 DIAGNOSIS — F418 Other specified anxiety disorders: Secondary | ICD-10-CM

## 2017-11-28 DIAGNOSIS — M545 Low back pain: Secondary | ICD-10-CM | POA: Insufficient documentation

## 2017-11-28 DIAGNOSIS — N2889 Other specified disorders of kidney and ureter: Secondary | ICD-10-CM | POA: Insufficient documentation

## 2017-11-28 DIAGNOSIS — R110 Nausea: Secondary | ICD-10-CM | POA: Insufficient documentation

## 2017-11-28 DIAGNOSIS — C3491 Malignant neoplasm of unspecified part of right bronchus or lung: Secondary | ICD-10-CM | POA: Insufficient documentation

## 2017-11-28 DIAGNOSIS — C3481 Malignant neoplasm of overlapping sites of right bronchus and lung: Secondary | ICD-10-CM | POA: Diagnosis present

## 2017-11-28 MED ORDER — CEFAZOLIN SODIUM-DEXTROSE 2-4 GM/100ML-% IV SOLN
2.0000 g | Freq: Once | INTRAVENOUS | Status: AC
  Administered 2017-11-29: 2 g via INTRAVENOUS

## 2017-11-28 NOTE — Progress Notes (Signed)
Chemo Care Visit  Subjective:  Patient ID: Wayne Chapman, male    DOB: 04-Jan-1956  Age: 62 y.o. MRN: [id number]  CC:  Chief Complaint  Patient presents with  . Chemo Care    HPI BAYLOR CORTEZ presents for Ord prior to initiating concurrent chemoradiation.  He was last seen by primary medical oncologist Dr. 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 Dr. Grayland Ormond recommended concurrent chemotherapy with weekly carbo/Taxol and radiation.  At the conclusion of radiation, he will receive maintenance Imfinzi q 2 weeks for 12 months.  Scheduled MRI of the brain for Thursday to complete staging work-up.  Has chemo education scheduled for today, port placement tomorrow, begin daily radiation on Wednesday along with MRI of the brain and begin chemotherapy on 12/05/2017.  History Patient presented to the emergency room on 11/09/2017 for evaluation of hemoptysis, shortness of breath and black stools.  Has medical history of smoking, alcohol abuse and prior necrotizing pneumonia in February 2019.  Chest x-ray showed concerns for right upper lobe consolidation concerning for recurring necrotizing pneumonia versus an atypical infection or malignancy.  Had CT chest concerning for necrotizing infection or malignancy.  He was admitted to the hospital and given IV antibiotics.  Had bronchoscopy performed by Dr. Chancy Milroy revealing bronchus mass.  He was started on 21 days of Augmentin for recurrent necrotizing pneumonia.  He was set up with Dr. Grayland Ormond for PET scan and results of biopsy on 11/17/17.     Patient Active Problem List   Diagnosis Date Noted  . Squamous cell lung cancer, right ([place]) 11/17/2017  . Necrotizing pneumonia ([place]) 08/11/2017  . Hemoptysis 08/09/2017  . Protein-calorie malnutrition, severe 08/09/2017   Past Medical History:  Diagnosis Date  . Anxiety   . Depression    Past Surgical History:   Procedure Laterality Date  . FLEXIBLE BRONCHOSCOPY N/A 11/11/2017   Procedure: FLEXIBLE BRONCHOSCOPY;  Surgeon: Allyne Gee, MD;  Location: [facility] ORS;  Service: Pulmonary;  Laterality: N/A;  . none     No Known Allergies Prior to Admission medications   Medication Sig Start Date End Date Taking? Authorizing Provider  diphenhydrAMINE (BENADRYL) 25 MG tablet Take 25 mg by mouth every morning.   Yes [provider]  naproxen sodium (ALEVE) 220 MG tablet Take 220 mg by mouth daily as needed.   Yes [provider]  feeding supplement, ENSURE ENLIVE, (ENSURE ENLIVE) LIQD Take 237 mLs by mouth 3 (three) times daily between meals. Patient not taking: Reported on 11/28/2017 11/13/17   Epifanio Lesches, MD  lidocaine-prilocaine (EMLA) cream Apply to affected area once Patient not taking: Reported on 11/28/2017 11/23/17   Lloyd Huger, MD  ondansetron (ZOFRAN) 8 MG tablet Take 1 tablet (8 mg total) by mouth 2 (two) times daily as needed for refractory nausea / vomiting. Patient not taking: Reported on 11/28/2017 11/23/17   Lloyd Huger, MD  prochlorperazine (COMPAZINE) 10 MG tablet Take 1 tablet (10 mg total) by mouth every 6 (six) hours as needed (Nausea or vomiting). Patient not taking: Reported on 11/28/2017 11/23/17   Lloyd Huger, MD  thiamine 100 MG tablet Take 1 tablet (100 mg total) by mouth daily. Patient not taking: Reported on 11/28/2017 11/13/17   Epifanio Lesches, MD   Social History   Socioeconomic History  . Marital status: Single    Spouse name: Not on file  . Number of  children: Not on file  . Years of education: Not on file  . Highest education level: Not on file  Occupational History  . Not on file  Social Needs  . Financial resource strain: Not on file  . Food insecurity:    Worry: Not on file    Inability: Not on file  . Transportation needs:    Medical: Not on file    Non-medical: Not on file  Tobacco Use  . Smoking status: Current  Every Day Smoker    Packs/day: 1.00    Types: Cigarettes  . Smokeless tobacco: Never Used  Substance and Sexual Activity  . Alcohol use: Yes    Comment: alot   . Drug use: Yes  . Sexual activity: Yes  Lifestyle  . Physical activity:    Days per week: Not on file    Minutes per session: Not on file  . Stress: Not on file  Relationships  . Social connections:    Talks on phone: Not on file    Gets together: Not on file    Attends religious service: Not on file    Active member of club or organization: Not on file    Attends meetings of clubs or organizations: Not on file    Relationship status: Not on file  . Intimate partner violence:    Fear of current or ex partner: Not on file    Emotionally abused: Not on file    Physically abused: Not on file    Forced sexual activity: Not on file  Other Topics Concern  . Not on file  Social History Narrative  . Not on file    Review of Systems  Constitutional: Negative.  Negative for appetite change, chills, fatigue and fever.  HENT: Negative.  Negative for congestion, mouth sores, nosebleeds, sinus pain and sore throat.   Eyes: Negative.   Respiratory: Positive for wheezing. Negative for cough and shortness of breath.        Mild hemoptysis  Cardiovascular: Negative.  Negative for chest pain and leg swelling.  Gastrointestinal: Negative.  Negative for abdominal distention, abdominal pain, blood in stool, constipation, diarrhea, nausea and vomiting.  Endocrine: Negative.   Genitourinary: Negative.  Negative for dysuria, flank pain, frequency, hematuria and urgency.  Musculoskeletal: Negative.  Negative for arthralgias and back pain.  Skin: Negative.  Negative for pallor.  Allergic/Immunologic: Negative.   Neurological: Negative.  Negative for dizziness, weakness and headaches.  Hematological: Negative.  Negative for adenopathy.  Psychiatric/Behavioral: Negative.  Negative for confusion. The patient is not nervous/anxious.      Objective:  BP 109/67 (BP Location: Right Arm, Patient Position: Sitting)   Pulse 69   Temp 98 F (36.7 C) (Tympanic)   Resp 16   Wt 133 lb 8 oz (60.6 kg)   SpO2 98%   BMI 17.37 kg/m   Physical Exam  Constitutional: He is oriented to person, place, and time. Vital signs are normal.  HENT:  Head: Normocephalic and atraumatic.  Eyes: Pupils are equal, round, and reactive to light.  Neck: Normal range of motion.  Cardiovascular: Normal rate, regular rhythm and normal heart sounds.  No murmur heard. Pulmonary/Chest: Effort normal. He has wheezes (Bilateral).  Abdominal: Soft. Normal appearance and bowel sounds are normal. He exhibits no distension. There is no tenderness.  Musculoskeletal: Normal range of motion. He exhibits no edema.  Neurological: He is alert and oriented to person, place, and time.  Skin: Skin is warm and dry. No rash noted.  Psychiatric: Judgment normal.    Assessment & Plan:  GIACOMO VALONE is a 62 y.o. male who presents for chemo care visit.  Patient states he is doing well.  He is scheduled for chemo education this morning this morning.  He does not have a primary care physician he sees regularly.  He takes Aleve PRN for chronic back pain.  Recently prescribed Zofran and Compazine in conjunction with his chemotherapy that begins next week.  Unfortunately, he tells me he does not have the funds to pay for these medications nor does he have transportation to and from treatments.  [facility], nurse navigator has already been in contact with Elease Etienne to help with funding for his medications and the [facility] to help with transportation.  Additionally she will get him in contact with a primary care physician whom he can see in the next week or so.   Stage IIIb lung cancer: Will have port placed tomorrow.  [facility] will pick him up and I personally spoke with daughter Hoyle Sauer who will pick him up around 4 PM. Begins daily radiation on  Wednesday, 11/30/2017 and chemo (carbo/Taxol), see Dr. Grayland Ormond with labs on 12/05/17.  Scheduled to have MRI on Thursday.  Chronic lower back pain: Continue Aleve PRN.  If he continues to notice hemoptysis, told him to hold off on the Aleve and take Tylenol instead.   Hemoptysis: Likely from recurrent necrotizing pneumonia but could be exacerbated by lung cancer and NSAID use. Recently finished antibiotics prescribed while inpatient by infectious disease MD.  Medications: Sent to [facility].  Will be covered with the use of the byrd fund per [facility] (navigator). Elease Etienne (Education officer, museum) we will get this all set up for him.  Transportation issues: Will be using the cancer center van.  This is already been arranged.  Spoke to daughter Hoyle Sauer who will also help on day of port placement due to sedation.  Weight loss/malnutrition: Continue daily supplementation.  Will additionally meet with [facility] during treatment day next week.  Weight is stable today at 133 pounds. (2 lb weight gain)  Renal mass: This is highly suspicious for renal cell carcinoma.  Case was discussed with urology.  At this time, will monitor and consider possible nephrectomy at the conclusion of treatments for lung cancer.  Greater than 50% was spent in counseling and coordination of care with this patient including but not limited to discussion of the relevant topics above (See A&P) including, but not limited to diagnosis and management of acute and chronic medical conditions.   No diagnosis found. No orders of the defined types were placed in this encounter.  There are no Patient Instructions on file for this visit.  Faythe Casa, NP 11/28/2017 1:00 PM

## 2017-11-29 ENCOUNTER — Ambulatory Visit
Admission: RE | Admit: 2017-11-29 | Discharge: 2017-11-29 | Disposition: A | Discharge status: Home / Self Care | Payer: Medicare Other | Source: Ambulatory Visit | Attending: Vascular Surgery | Admitting: Vascular Surgery

## 2017-11-29 ENCOUNTER — Encounter
Admission: RE | Disposition: A | Discharge status: Home / Self Care | Payer: Self-pay | Source: Ambulatory Visit | Attending: Vascular Surgery

## 2017-11-29 ENCOUNTER — Ambulatory Visit: Payer: Medicare Other

## 2017-11-29 ENCOUNTER — Ambulatory Visit: Payer: Medicare Other | Admitting: Oncology

## 2017-11-29 ENCOUNTER — Other Ambulatory Visit: Payer: Medicare Other

## 2017-11-29 DIAGNOSIS — Z79899 Other long term (current) drug therapy: Secondary | ICD-10-CM | POA: Diagnosis not present

## 2017-11-29 DIAGNOSIS — F17210 Nicotine dependence, cigarettes, uncomplicated: Secondary | ICD-10-CM | POA: Insufficient documentation

## 2017-11-29 DIAGNOSIS — E43 Unspecified severe protein-calorie malnutrition: Secondary | ICD-10-CM | POA: Insufficient documentation

## 2017-11-29 DIAGNOSIS — C3490 Malignant neoplasm of unspecified part of unspecified bronchus or lung: Secondary | ICD-10-CM

## 2017-11-29 DIAGNOSIS — C3411 Malignant neoplasm of upper lobe, right bronchus or lung: Secondary | ICD-10-CM | POA: Insufficient documentation

## 2017-11-29 DIAGNOSIS — Z9889 Other specified postprocedural states: Secondary | ICD-10-CM | POA: Insufficient documentation

## 2017-11-29 HISTORY — PX: PORTA CATH INSERTION: CATH118285

## 2017-11-29 SURGERY — PORTA CATH INSERTION
Anesthesia: Moderate Sedation

## 2017-11-29 MED ORDER — HEPARIN (PORCINE) IN NACL 1000-0.9 UT/500ML-% IV SOLN
INTRAVENOUS | Status: AC
  Filled 2017-11-29: qty 500

## 2017-11-29 MED ORDER — MIDAZOLAM HCL 5 MG/5ML IJ SOLN
INTRAMUSCULAR | Status: AC
  Filled 2017-11-29: qty 5

## 2017-11-29 MED ORDER — FENTANYL CITRATE (PF) 100 MCG/2ML IJ SOLN
INTRAMUSCULAR | Status: DC | PRN
  Administered 2017-11-29: 50 ug via INTRAVENOUS

## 2017-11-29 MED ORDER — ONDANSETRON HCL 4 MG/2ML IJ SOLN
4.0000 mg | Freq: Four times a day (QID) | INTRAMUSCULAR | Status: DC | PRN
Start: 2017-11-29 — End: 2017-11-29

## 2017-11-29 MED ORDER — SODIUM CHLORIDE 0.9 % IV SOLN
INTRAVENOUS | Status: DC
  Administered 2017-11-29: 14:00:00 via INTRAVENOUS

## 2017-11-29 MED ORDER — SODIUM CHLORIDE 0.9 % IV SOLN
Freq: Once | INTRAVENOUS | Status: DC
  Filled 2017-11-29: qty 2

## 2017-11-29 MED ORDER — LIDOCAINE-EPINEPHRINE (PF) 1 %-1:200000 IJ SOLN
INTRAMUSCULAR | Status: AC
  Filled 2017-11-29: qty 30

## 2017-11-29 MED ORDER — MIDAZOLAM HCL 2 MG/2ML IJ SOLN
INTRAMUSCULAR | Status: DC | PRN
  Administered 2017-11-29: 2 mg via INTRAVENOUS

## 2017-11-29 MED ORDER — HYDROMORPHONE HCL 1 MG/ML IJ SOLN: 1.0000 mg | Freq: Once | INTRAMUSCULAR | Status: DC | PRN

## 2017-11-29 MED ORDER — FENTANYL CITRATE (PF) 100 MCG/2ML IJ SOLN
INTRAMUSCULAR | Status: AC
  Filled 2017-11-29: qty 2

## 2017-11-29 SURGICAL SUPPLY — 12 items
CANNULA 5F STIFF (CANNULA) ×3 IMPLANT
DRAPE INCISE IOBAN 66X45 STRL (DRAPES) ×6 IMPLANT
KIT PORT POWER 8FR ISP CVUE (Port) ×3 IMPLANT
NEEDLE ENTRY 21GA 7CM ECHOTIP (NEEDLE) ×3 IMPLANT
PACK ANGIOGRAPHY (CUSTOM PROCEDURE TRAY) ×3 IMPLANT
PAD GROUND ADULT SPLIT (MISCELLANEOUS) ×3 IMPLANT
PENCIL ELECTRO HAND CTR (MISCELLANEOUS) ×3 IMPLANT
SPONGE XRAY 4X4 16PLY STRL (MISCELLANEOUS) ×3 IMPLANT
SUT MNCRL AB 4-0 PS2 18 (SUTURE) ×3 IMPLANT
SUT PROLENE 0 CT 1 30 (SUTURE) ×3 IMPLANT
SUT VICRYL+ 3-0 36IN CT-1 (SUTURE) ×3 IMPLANT
TOWEL OR 17X26 4PK STRL BLUE (TOWEL DISPOSABLE) ×3 IMPLANT

## 2017-11-29 NOTE — Op Note (Signed)
OPERATIVE NOTE   PROCEDURE: 1. Placement of a left IJ Infuse-a-Port  PRE-OPERATIVE DIAGNOSIS: Right upper lobe lung carcinoma  POST-OPERATIVE DIAGNOSIS: Same  SURGEON: Katha Cabal M.D.  ANESTHESIA: Conscious sedation was administered under my direct supervision by the interventional radiology RN. IV Versed plus fentanyl were utilized. Continuous ECG, pulse oximetry and blood pressure was monitored throughout the entire procedure. Conscious sedation was for a total of 31 minutes.  ESTIMATED BLOOD LOSS: Minimal   FINDING(S): 1.  Patent vein  SPECIMEN(S): None  INDICATIONS:   Wayne Chapman is a 62 y.o. male who presents with right upper lobe lung carcinoma.  He is now scheduled for both radiation therapy and chemotherapy.  The risks and benefits for Infuse-a-Port placement have been discussed with the patient and he is willing to proceed.  DESCRIPTION: After obtaining full informed written consent, the patient was brought back to the special procedure suite and placed in the supine position. The patient's left neck and chest wall are prepped and draped in sterile fashion. Appropriate timeout was called.  Ultrasound is placed in a sterile sleeve, ultrasound is utilized to avoid vascular injury as well as secondary to lack of appropriate landmarks. The left internal jugular vein is identified. It is echolucent and homogeneous as well as easily compressible indicating patency. An image is recorded for the permanent record.  Access to the vein with a micropuncture needle is done under direct ultrasound visualization.  1% lidocaine is infiltrated into the soft tissue at the base of the neck as well as on the chest wall.  Under direct ultrasound visualization a micro-needle is inserted into the vein followed by the micro-wire. Micro-sheath was then advanced and a J wire is inserted without difficulty under fluoroscopic guidance. A small counterincision was created at the wire insertion  site. A transverse incision is created 2 fingerbreadths below the scapula and a pocket is fashioned using both blunt and sharp dissection. The pocket is tested for appropriate size with the hub of the Infuse-a-Port. The tunneling device is then used to pull the intravascular portion of the catheter from the pocket to the neck counterincision.  Dilator and peel-away sheath were then inserted over the wire and the wire is removed. Catheter is then advanced into the venous system without difficulty. Peel-away sheath was then removed.  Catheter is then positioned under fluoroscopic guidance at the atrial caval junction. It is then transected connected to the hub and the hope is slipped into the subcutaneous pocket on the chest wall. The hub was then accessed percutaneously and aspirates easily and flushes well and is flushed with 30 cc of heparinized saline. The pocket incision is then closed in layers using interrupted 3-0 Vicryl for the subcutaneous tissues and 4-0 Monocryl subcuticular for skin closure. Dermabond is applied. The neck counterincision was closed with 4-0 Monocryl subcuticular and Dermabond as well.  The patient tolerated the procedure well and there were no immediate complications.  COMPLICATIONS: None  CONDITION: Unchanged  Katha Cabal M.D. [place] [facility] Office: [phone]   11/29/2017, 4:34 PM

## 2017-11-29 NOTE — H&P (Signed)
[facility] History & Physical Update  The patient was interviewed and re-examined.  The patient's previous History and Physical from Dr Grayland Ormond has been reviewed and is unchanged.  Right lung CA will undergo chemotherapy and XRT.  Therefore will place the port on the left side.  There is no change in the plan of care. We plan to proceed with the scheduled procedure.  Hortencia Pilar, MD  11/29/2017, 4:31 PM

## 2017-11-29 NOTE — Discharge Instructions (Signed)
Implanted Port Insertion, Care After °This sheet gives you information about how to care for yourself after your procedure. Your health care provider may also give you more specific instructions. If you have problems or questions, contact your health care provider. °What can I expect after the procedure? °After your procedure, it is common to have: °· Discomfort at the port insertion site. °· Bruising on the skin over the port. This should improve over 3-4 days. ° °Follow these instructions at home: °Port care °· After your port is placed, you will get a manufacturer's information card. The card has information about your port. Keep this card with you at all times. °· Take care of the port as told by your health care provider. Ask your health care provider if you or a family member can get training for taking care of the port at home. A home health care nurse may also take care of the port. °· Make sure to remember what type of port you have. °Incision care °· Follow instructions from your health care provider about how to take care of your port insertion site. Make sure you: °? Wash your hands with soap and water before you change your bandage (dressing). If soap and water are not available, use hand sanitizer. °? Change your dressing as told by your health care provider. °? Leave stitches (sutures), skin glue, or adhesive strips in place. These skin closures may need to stay in place for 2 weeks or longer. If adhesive strip edges start to loosen and curl up, you may trim the loose edges. Do not remove adhesive strips completely unless your health care provider tells you to do that. °· Check your port insertion site every day for signs of infection. Check for: °? More redness, swelling, or pain. °? More fluid or blood. °? Warmth. °? Pus or a bad smell. °General instructions °· Do not take baths, swim, or use a hot tub until your health care provider approves. °· Do not lift anything that is heavier than 10 lb (4.5  kg) for a week, or as told by your health care provider. °· Ask your health care provider when it is okay to: °? Return to work or school. °? Resume usual physical activities or sports. °· Do not drive for 24 hours if you were given a medicine to help you relax (sedative). °· Take over-the-counter and prescription medicines only as told by your health care provider. °· Wear a medical alert bracelet in case of an emergency. This will tell any health care providers that you have a port. °· Keep all follow-up visits as told by your health care provider. This is important. °Contact a health care provider if: °· You cannot flush your port with saline as directed, or you cannot draw blood from the port. °· You have a fever or chills. °· You have more redness, swelling, or pain around your port insertion site. °· You have more fluid or blood coming from your port insertion site. °· Your port insertion site feels warm to the touch. °· You have pus or a bad smell coming from the port insertion site. °Get help right away if: °· You have chest pain or shortness of breath. °· You have bleeding from your port that you cannot control. °Summary °· Take care of the port as told by your health care provider. °· Change your dressing as told by your health care provider. °· Keep all follow-up visits as told by your health care provider. °  This information is not intended to replace advice given to you by your health care provider. Make sure you discuss any questions you have with your health care provider. °Document Released: 04/04/2013 Document Revised: 05/05/2016 Document Reviewed: 05/05/2016 °Elsevier Interactive Patient Education © 2017 [employer] ° °

## 2017-11-30 ENCOUNTER — Encounter: Payer: Self-pay | Admitting: Vascular Surgery

## 2017-11-30 ENCOUNTER — Ambulatory Visit
Admission: RE | Admit: 2017-11-30 | Discharge: 2017-11-30 | Disposition: A | Discharge status: Home / Self Care | Payer: Medicare Other | Source: Ambulatory Visit | Attending: Radiation Oncology | Admitting: Radiation Oncology

## 2017-11-30 DIAGNOSIS — Z510 Encounter for antineoplastic radiation therapy: Secondary | ICD-10-CM | POA: Diagnosis not present

## 2017-12-01 ENCOUNTER — Ambulatory Visit
Admission: RE | Admit: 2017-12-01 | Discharge: 2017-12-01 | Disposition: A | Discharge status: Home / Self Care | Payer: Medicare Other | Source: Ambulatory Visit | Attending: Oncology | Admitting: Oncology

## 2017-12-01 ENCOUNTER — Ambulatory Visit
Admission: RE | Admit: 2017-12-01 | Discharge: 2017-12-01 | Disposition: A | Discharge status: Home / Self Care | Payer: Medicare Other | Source: Ambulatory Visit | Attending: Radiation Oncology | Admitting: Radiation Oncology

## 2017-12-01 DIAGNOSIS — J0100 Acute maxillary sinusitis, unspecified: Secondary | ICD-10-CM | POA: Insufficient documentation

## 2017-12-01 DIAGNOSIS — J320 Chronic maxillary sinusitis: Secondary | ICD-10-CM | POA: Diagnosis not present

## 2017-12-01 DIAGNOSIS — I739 Peripheral vascular disease, unspecified: Secondary | ICD-10-CM | POA: Insufficient documentation

## 2017-12-01 DIAGNOSIS — H748X3 Other specified disorders of middle ear and mastoid, bilateral: Secondary | ICD-10-CM | POA: Insufficient documentation

## 2017-12-01 DIAGNOSIS — J0130 Acute sphenoidal sinusitis, unspecified: Secondary | ICD-10-CM | POA: Insufficient documentation

## 2017-12-01 DIAGNOSIS — Z510 Encounter for antineoplastic radiation therapy: Secondary | ICD-10-CM | POA: Diagnosis not present

## 2017-12-01 DIAGNOSIS — J323 Chronic sphenoidal sinusitis: Secondary | ICD-10-CM | POA: Insufficient documentation

## 2017-12-01 DIAGNOSIS — C3490 Malignant neoplasm of unspecified part of unspecified bronchus or lung: Secondary | ICD-10-CM | POA: Insufficient documentation

## 2017-12-01 DIAGNOSIS — G319 Degenerative disease of nervous system, unspecified: Secondary | ICD-10-CM | POA: Diagnosis not present

## 2017-12-01 MED ORDER — GADOBENATE DIMEGLUMINE 529 MG/ML IV SOLN
12.0000 mL | Freq: Once | INTRAVENOUS | Status: AC | PRN
  Administered 2017-12-01: 12 mL via INTRAVENOUS

## 2017-12-02 ENCOUNTER — Ambulatory Visit
Admission: RE | Admit: 2017-12-02 | Discharge: 2017-12-02 | Disposition: A | Discharge status: Home / Self Care | Payer: Medicare Other | Source: Ambulatory Visit | Attending: Radiation Oncology | Admitting: Radiation Oncology

## 2017-12-02 DIAGNOSIS — Z510 Encounter for antineoplastic radiation therapy: Secondary | ICD-10-CM | POA: Diagnosis not present

## 2017-12-03 ENCOUNTER — Ambulatory Visit: Payer: Medicare Other

## 2017-12-03 LAB — CULTURE, FUNGUS WITHOUT SMEAR

## 2017-12-03 NOTE — Progress Notes (Signed)
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  INTERVAL HISTORY: Patient returns to clinic today for further evaluation and initiation of cycle 1 of weekly carboplatin and Taxol.  He initiated his XRT last week and is tolerating this well.  He currently feels well and is asymptomatic.  He has no neurologic complaints.  He denies any recent fevers.  He has a good appetite and his weight has remained stable. He denies any chest pain, shortness of breath, cough, or hemoptysis.  He has no nausea, vomiting, constipation, or diarrhea.  He has no urinary complaints.  Patient offers no specific complaints today.  REVIEW OF SYSTEMS:   Review of Systems  Constitutional: Negative.  Negative for fever, malaise/fatigue and weight loss.  Respiratory: Negative.  Negative for cough, hemoptysis and shortness of breath.   Cardiovascular: Negative.  Negative for chest pain and leg swelling.  Gastrointestinal: Negative.  Negative for abdominal pain and constipation.  Genitourinary: Negative.  Negative for dysuria.  Musculoskeletal: Negative.   Skin: Negative.  Negative for rash.  Neurological: Negative.  Negative for sensory change, focal weakness, weakness and headaches.  Psychiatric/Behavioral: Negative.  The patient is not nervous/anxious.     As per HPI. Otherwise, a complete review of systems is negative.  PAST MEDICAL HISTORY: Past Medical History:  Diagnosis Date  . Anxiety   . Depression     PAST SURGICAL HISTORY: Past Surgical History:  Procedure Laterality Date  . FLEXIBLE BRONCHOSCOPY N/A 11/11/2017   Procedure: FLEXIBLE BRONCHOSCOPY;  Surgeon: Allyne Gee, MD;  Location: [facility];   Service: Pulmonary;  Laterality: N/A;  . none    . PORTA CATH INSERTION N/A 11/29/2017   Procedure: PORTA CATH INSERTION;  Surgeon: Katha Cabal, MD;  Location: [place] [facility];  Service: Cardiovascular;  Laterality: N/A;    FAMILY HISTORY: Family History  Problem Relation Age of Onset  . Diabetes Mellitus II Mother     ADVANCED DIRECTIVES (Y/N):  N  HEALTH MAINTENANCE: Social History   Tobacco Use  . Smoking status: Current Every Day Smoker    Packs/day: 1.00    Types: Cigarettes  . Smokeless tobacco: Never Used  Substance Use Topics  . Alcohol use: Yes    Comment: alot   . Drug use: Yes     Colonoscopy:  PAP:  Bone density:  Lipid panel:  No Known Allergies  Current Outpatient Medications  Medication Sig Dispense Refill  . diphenhydrAMINE (BENADRYL) 25 MG tablet Take 25 mg by mouth every morning.    . feeding supplement, ENSURE ENLIVE, (ENSURE ENLIVE) LIQD Take 237 mLs by mouth 3 (three) times daily between meals. 237 mL 12  . lidocaine-prilocaine (EMLA) cream Apply to affected area once 30 g 3  . naproxen sodium (ALEVE) 220 MG tablet Take 220 mg by mouth daily.     . ondansetron (ZOFRAN) 8 MG tablet Take 1 tablet (8 mg total) by mouth 2 (two) times daily as needed for refractory nausea / vomiting. 30 tablet 2  . prochlorperazine (COMPAZINE) 10 MG tablet Take 1 tablet (10 mg total) by mouth every 6 (six) hours as needed (Nausea or vomiting). 60 tablet 2  . thiamine 100 MG tablet Take 1 tablet (100  mg total) by mouth daily. 30 tablet 0   No current facility-administered medications for this visit.     OBJECTIVE: Vitals:   12/05/17 0923 12/05/17 0926  BP:  110/65  Pulse:  77  Resp: 16   Temp:  (!) 97.3 F (36.3 C)     Body mass index is 17.36 kg/m.    ECOG FS:0 - Asymptomatic  General: Well-developed, well-nourished, no acute distress. Eyes: Pink conjunctiva, anicteric sclera. Lungs: Clear to auscultation bilaterally. Heart: Regular rate and  rhythm. No rubs, murmurs, or gallops. Abdomen: Soft, nontender, nondistended. No organomegaly noted, normoactive bowel sounds. Musculoskeletal: No edema, cyanosis, or clubbing. Neuro: Alert, answering all questions appropriately. Cranial nerves grossly intact. Skin: No rashes or petechiae noted. Psych: Normal affect.   LAB RESULTS:  Lab Results  Component Value Date   NA 132 (L) 12/05/2017   K 4.2 12/05/2017   CL 97 (L) 12/05/2017   CO2 24 12/05/2017   GLUCOSE 132 (H) 12/05/2017   BUN 11 12/05/2017   CREATININE 0.85 12/05/2017   CALCIUM 10.1 12/05/2017   PROT 8.4 (H) 12/05/2017   ALBUMIN 3.6 12/05/2017   AST 24 12/05/2017   ALT 11 (L) 12/05/2017   ALKPHOS 80 12/05/2017   BILITOT 0.5 12/05/2017   GFRNONAA >60 12/05/2017   GFRAA >60 12/05/2017    Lab Results  Component Value Date   WBC 5.9 12/05/2017   NEUTROABS 3.6 12/05/2017   HGB 13.3 12/05/2017   HCT 36.9 (L) 12/05/2017   MCV 86.4 12/05/2017   PLT 282 12/05/2017     STUDIES: Dg Chest 2 View  Result Date: 11/09/2017 CLINICAL DATA:  Hemoptysis 2 days ago. Also dark blood in stools. Diagnosed with pneumonia 3 months ago. Current smoker. History of necrotizing pneumonia and malnutrition. EXAM: CHEST - 2 VIEW COMPARISON:  CT scan of the chest of August 09, 2017 and PA and lateral chest x-ray of the same day. FINDINGS: There is persistent abnormal heterogeneous opacity in the right upper lobe. There is pleural thickening. The appearance of the abnormality is somewhat less prominent today. Elsewhere the right lung is clear as is the left lung. The heart and pulmonary vascularity are normal. The bony thorax exhibits no acute abnormality. IMPRESSION: Slightly decreased conspicuity of consolidation in the right upper lobe superiorly. This may reflect incomplete clearing of the suspected necrotizing pneumonia. However, atypical infection or malignancy are not excluded. Repeat chest CT scanning is recommended. Electronically  Signed   By: David  Martinique M.D.   On: 11/09/2017 12:35   Ct Chest W Contrast  Result Date: 11/09/2017 CLINICAL DATA:  Hemoptysis. Non resolving right upper lobe opacity on recent chest radiograph. EXAM: CT CHEST WITH CONTRAST TECHNIQUE: Multidetector CT imaging of the chest was performed during intravenous contrast administration. CONTRAST:  41m ISOVUE-370 IOPAMIDOL (ISOVUE-370) INJECTION 76% COMPARISON:  Chest radiograph on 11/09/2017, and chest CT on 08/09/2017 FINDINGS: Cardiovascular: No acute findings. Aortic and coronary artery atherosclerosis. Mediastinum/Nodes: Mediastinal lymphadenopathy is again seen throughout the right paratracheal, pretracheal, and subcarinal regions, and to lesser degree the AP window. Largest area in the precarinal region measures 2.6 cm on image 50/2, without significant change compared to prior study. Mild right hilar lymphadenopathy shows no significant change, with soft tissue density encasing the right mainstem bronchus and causing stenosis of the central right upper lobe bronchus. Lungs/Pleura: Persistent right upper lobe atelectasis is seen, with central bronchiectasis and larger cystic areas in the right lung apex. A rounded opacity is seen within 1 of these cystic  areas in the medial right lung apex measuring 2.3 x 3.3 cm on image 26/2. This is increased in size compared to previous study and is suspicious for mycetoma, although neoplasm cannot definitely be excluded. Previously seen area of consolidation in the superior and medial aspect of the right lower lobe has resolved since previous study. New peribronchovascular nodular opacities are seen throughout the right lower lobe, most consistent with inflammatory or infectious etiology. No evidence of pleural effusion. Left lung remains clear. Upper Abdomen:  Unremarkable. Musculoskeletal:  No suspicious bone lesions. IMPRESSION: Persistent right upper lobe atelectasis with bronchiectasis and cystic lucencies. Increased  size of 3 cm nodular density within right apical cystic lucency is suspicious for mycetoma, although neoplasm cannot definitely be excluded. Mediastinal and right hilar lymphadenopathy without significant change. Narrowing of central right upper lobe bronchus and bronchus intermedius again seen. Malignancy cannot be excluded. Consider bronchoscopy and EBUS for further evaluation. New peribronchovascular nodular opacities in right lower lobe, consistent with inflammatory or infectious etiology. No evidence of pleural effusion. Aortic Atherosclerosis (ICD10-I70.0). Coronary artery calcification. Electronically Signed   By: Earle Gell M.D.   On: 11/09/2017 14:57   Mr [name] VE Contrast  Result Date: 12/01/2017 CLINICAL DATA:  Malignant neoplasm of lung.  Staging. EXAM: MRI HEAD WITHOUT AND WITH CONTRAST TECHNIQUE: Multiplanar, multiecho pulse sequences of the brain and surrounding structures were obtained without and with intravenous contrast. CONTRAST:  79m MULTIHANCE GADOBENATE DIMEGLUMINE 529 MG/ML IV SOLN COMPARISON:  PET scan 11/16/2017. FINDINGS: Brain: No evidence for acute infarction, hemorrhage, mass lesion, hydrocephalus, or extra-axial fluid. Generalized atrophy. Mild subcortical and periventricular T2 and FLAIR hyperintensities, likely chronic microvascular ischemic change. Periventricular white matter signal abnormality on the LEFT, demonstrates T2 shine through, but does not demonstrate restriction. Post infusion, no abnormal enhancement of the brain or meninges. Vascular: Flow voids are maintained throughout the carotid, basilar, and vertebral arteries. There are no areas of chronic hemorrhage. Skull and upper cervical spine: Normal marrow signal. No tonsillar herniation. Mild spondylosis C4-C5. Sinuses/Orbits: Paranasal sinuses demonstrate mild mucosal thickening in the ethmoids. Some layering fluid in the larger RIGHT division sphenoid as well as the RIGHT maxillary sinus. Other: BILATERAL  layering mastoid effusions. No nasopharyngeal process is evident. IMPRESSION: Atrophy with mild small vessel disease. No acute intracranial findings. No visible intracranial metastatic disease. No osseous lesions are observed. Chronic and acute sinus disease, with layering fluid in the RIGHT maxillary and RIGHT sphenoid regions. Layering mastoid effusions, uncertain significance. Electronically Signed   By: JStaci RighterM.D.   On: 12/01/2017 15:46   Nm Pet Image Initial (pi) Skull Base To Thigh  Result Date: 11/16/2017 CLINICAL DATA:  Initial treatment strategy for right upper lobe lung mass. EXAM: NUCLEAR MEDICINE PET SKULL BASE TO THIGH TECHNIQUE: 7.1 mCi F-18 FDG was injected intravenously. Full-ring PET imaging was performed from the skull base to thigh after the radiotracer. CT data was obtained and used for attenuation correction and anatomic localization. Fasting blood glucose: 108 mg/dl COMPARISON:  Chest CT 11/09/2017 and 08/09/2017 FINDINGS: Mediastinal blood pool activity: SUV max 1.64 NECK: No hypermetabolic lymph nodes in the neck. Incidental CT findings: none CHEST: Large centrally necrotic right upper lobe lung mass invading the right hilum and mediastinum with marked hypermetabolism and SUV max of 20.3. Findings highly suspicious for neoplasm. 12 mm right supraclavicular lymph node is hypermetabolic with SUV max of 77.20and may be a good target for ultrasound-guided biopsy. No pulmonary nodules suspicious for pulmonary metastatic disease. There is  diffuse patchy tree-in-bud pattern consistent with an inflammatory process or atypical infection. Incidental CT findings: none ABDOMEN/PELVIS: There is a hypermetabolic right renal lesion measuring approximately 2.2 cm in the interpolar region anteriorly. SUV max is 14.9 and this is highly suspicious for a primary renal cell cancer. Metastatic disease would seem unlikely. No findings for hepatic or adrenal gland metastasis. Incidental CT findings: none  SKELETON: No focal hypermetabolic activity to suggest skeletal metastasis. Incidental CT findings: none IMPRESSION: 1. Large necrotic right apical lung mass invading the right hilum and mediastinum. Marked hypermetabolism consistent with neoplastic process. 2. 12 mm hypermetabolic supraclavicular lymph node on the right. This should be amenable to ultrasound-guided biopsy. 3. 2.2 cm right hypermetabolic renal lesion suspicious for primary renal cell carcinoma. MRI without and with contrast may be helpful for further evaluation. Electronically Signed   By: Marijo Sanes M.D.   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  PLAN:    1. 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  Final pathology results discussed at cancer conference as well as with pathologist.  Patient will benefit from concurrent chemotherapy with weekly carboplatin and Taxol along with daily XRT.  At the conclusion of his XRT, he will also receive maintenance Infinzi every 2 weeks for up to 12 months.  MRI of the brain negative for metastatic disease.  Proceed with cycle 1 of weekly carboplatin and Taxol today.  Continue daily XRT.  Return to clinic in 1 week for further evaluation and consideration of cycle 2.   2.  Renal mass: Highly suspicious for renal cell carcinoma.  Case discussed with urology.  Will continue to monitor and consider possible nephrectomy at the conclusion of his treatments for his squamous cell carcinoma of lung.  Approximately 30 minutes was spent in discussion of which greater than 50% was consultation.  Patient expressed understanding and was in agreement with this plan. He also understands that He can call clinic at any time with any questions, concerns, or complaints.   Cancer Staging Squamous cell lung cancer, right ([place]) Staging  form: Lung, AJCC 8th Edition - Clinical: Stage IIIC (cT4, cN3, cM0) - Signed by Lloyd Huger, MD on 11/23/2017   Lloyd Huger, MD   12/05/2017 1:41 PM

## 2017-12-05 ENCOUNTER — Inpatient Hospital Stay (HOSPITAL_BASED_OUTPATIENT_CLINIC_OR_DEPARTMENT_OTHER): Payer: Medicare Other | Admitting: Oncology

## 2017-12-05 ENCOUNTER — Inpatient Hospital Stay: Payer: Medicare Other

## 2017-12-05 ENCOUNTER — Encounter: Payer: Self-pay | Admitting: Oncology

## 2017-12-05 ENCOUNTER — Ambulatory Visit
Admission: RE | Admit: 2017-12-05 | Discharge: 2017-12-05 | Disposition: A | Discharge status: Home / Self Care | Payer: Medicare Other | Source: Ambulatory Visit | Attending: Radiation Oncology | Admitting: Radiation Oncology

## 2017-12-05 ENCOUNTER — Other Ambulatory Visit: Payer: Self-pay

## 2017-12-05 ENCOUNTER — Encounter: Payer: Self-pay | Admitting: *Deleted

## 2017-12-05 VITALS — BP 133/82 | HR 66

## 2017-12-05 VITALS — BP 110/65 | HR 77 | Temp 97.3°F | Resp 16 | Ht 73.5 in | Wt 133.4 lb

## 2017-12-05 DIAGNOSIS — Z79899 Other long term (current) drug therapy: Secondary | ICD-10-CM

## 2017-12-05 DIAGNOSIS — M545 Low back pain: Secondary | ICD-10-CM

## 2017-12-05 DIAGNOSIS — F418 Other specified anxiety disorders: Secondary | ICD-10-CM

## 2017-12-05 DIAGNOSIS — E46 Unspecified protein-calorie malnutrition: Secondary | ICD-10-CM | POA: Diagnosis not present

## 2017-12-05 DIAGNOSIS — Z5111 Encounter for antineoplastic chemotherapy: Secondary | ICD-10-CM | POA: Diagnosis not present

## 2017-12-05 DIAGNOSIS — F17210 Nicotine dependence, cigarettes, uncomplicated: Secondary | ICD-10-CM

## 2017-12-05 DIAGNOSIS — C3491 Malignant neoplasm of unspecified part of right bronchus or lung: Secondary | ICD-10-CM

## 2017-12-05 DIAGNOSIS — C3411 Malignant neoplasm of upper lobe, right bronchus or lung: Secondary | ICD-10-CM | POA: Diagnosis not present

## 2017-12-05 DIAGNOSIS — N2889 Other specified disorders of kidney and ureter: Secondary | ICD-10-CM

## 2017-12-05 DIAGNOSIS — Z510 Encounter for antineoplastic radiation therapy: Secondary | ICD-10-CM | POA: Diagnosis not present

## 2017-12-05 LAB — COMPREHENSIVE METABOLIC PANEL
ALT: 11 U/L — AB (ref 17–63)
AST: 24 U/L (ref 15–41)
Albumin: 3.6 g/dL (ref 3.5–5.0)
Alkaline Phosphatase: 80 U/L (ref 38–126)
Anion gap: 11 (ref 5–15)
BUN: 11 mg/dL (ref 6–20)
CHLORIDE: 97 mmol/L — AB (ref 101–111)
CO2: 24 mmol/L (ref 22–32)
CREATININE: 0.85 mg/dL (ref 0.61–1.24)
Calcium: 10.1 mg/dL (ref 8.9–10.3)
Glucose, Bld: 132 mg/dL — ABNORMAL HIGH (ref 65–99)
Potassium: 4.2 mmol/L (ref 3.5–5.1)
Sodium: 132 mmol/L — ABNORMAL LOW (ref 135–145)
TOTAL PROTEIN: 8.4 g/dL — AB (ref 6.5–8.1)
Total Bilirubin: 0.5 mg/dL (ref 0.3–1.2)

## 2017-12-05 LAB — CBC WITH DIFFERENTIAL/PLATELET
BASOS ABS: 0.1 10*3/uL (ref 0–0.1)
BASOS PCT: 1 %
EOS ABS: 0.2 10*3/uL (ref 0–0.7)
EOS PCT: 3 %
HCT: 36.9 % — ABNORMAL LOW (ref 40.0–52.0)
Hemoglobin: 13.3 g/dL (ref 13.0–18.0)
Lymphocytes Relative: 22 %
Lymphs Abs: 1.3 10*3/uL (ref 1.0–3.6)
MCH: 31 pg (ref 26.0–34.0)
MCHC: 35.9 g/dL (ref 32.0–36.0)
MCV: 86.4 fL (ref 80.0–100.0)
Monocytes Absolute: 0.8 10*3/uL (ref 0.2–1.0)
Monocytes Relative: 13 %
Neutro Abs: 3.6 10*3/uL (ref 1.4–6.5)
Neutrophils Relative %: 61 %
PLATELETS: 282 10*3/uL (ref 150–440)
RBC: 4.27 MIL/uL — AB (ref 4.40–5.90)
RDW: 14.8 % — ABNORMAL HIGH (ref 11.5–14.5)
WBC: 5.9 10*3/uL (ref 3.8–10.6)

## 2017-12-05 MED ORDER — PALONOSETRON HCL INJECTION 0.25 MG/5ML
0.2500 mg | Freq: Once | INTRAVENOUS | Status: AC
  Administered 2017-12-05: 0.25 mg via INTRAVENOUS
  Filled 2017-12-05: qty 5

## 2017-12-05 MED ORDER — SODIUM CHLORIDE 0.9 % IV SOLN
45.0000 mg/m2 | Freq: Once | INTRAVENOUS | Status: AC
  Administered 2017-12-05: 78 mg via INTRAVENOUS
  Filled 2017-12-05: qty 13

## 2017-12-05 MED ORDER — DEXAMETHASONE SODIUM PHOSPHATE 10 MG/ML IJ SOLN
10.0000 mg | Freq: Once | INTRAMUSCULAR | Status: AC
  Administered 2017-12-05: 10 mg via INTRAVENOUS
  Filled 2017-12-05: qty 1

## 2017-12-05 MED ORDER — FAMOTIDINE IN NACL 20-0.9 MG/50ML-% IV SOLN
20.0000 mg | Freq: Once | INTRAVENOUS | Status: AC
  Administered 2017-12-05: 20 mg via INTRAVENOUS
  Filled 2017-12-05: qty 50

## 2017-12-05 MED ORDER — DIPHENHYDRAMINE HCL 50 MG/ML IJ SOLN
25.0000 mg | Freq: Once | INTRAMUSCULAR | Status: AC
  Administered 2017-12-05: 25 mg via INTRAVENOUS
  Filled 2017-12-05: qty 1

## 2017-12-05 MED ORDER — SODIUM CHLORIDE 0.9 % IV SOLN: 10.0000 mg | Freq: Once | INTRAVENOUS | Status: DC

## 2017-12-05 MED ORDER — SODIUM CHLORIDE 0.9% FLUSH
10.0000 mL | Freq: Once | INTRAVENOUS | Status: AC
  Administered 2017-12-05: 10 mL via INTRAVENOUS
  Filled 2017-12-05: qty 10

## 2017-12-05 MED ORDER — SODIUM CHLORIDE 0.9 % IV SOLN
Freq: Once | INTRAVENOUS | Status: AC
  Administered 2017-12-05: 10:00:00 via INTRAVENOUS
  Filled 2017-12-05: qty 1000

## 2017-12-05 MED ORDER — SODIUM CHLORIDE 0.9 % IV SOLN
210.0000 mg | Freq: Once | INTRAVENOUS | Status: AC
  Administered 2017-12-05: 210 mg via INTRAVENOUS
  Filled 2017-12-05: qty 21

## 2017-12-05 MED ORDER — HEPARIN SOD (PORK) LOCK FLUSH 100 UNIT/ML IV SOLN
500.0000 [IU] | Freq: Once | INTRAVENOUS | Status: AC
  Administered 2017-12-05: 500 [IU] via INTRAVENOUS
  Filled 2017-12-05: qty 5

## 2017-12-05 NOTE — Progress Notes (Signed)
Patient here for pre treatment check. No changes since last appointment.

## 2017-12-05 NOTE — Progress Notes (Signed)
  Oncology Nurse Navigator Documentation  Navigator Location: [facility] (12/05/17 1000)   )Navigator Encounter Type: Treatment (12/05/17 1000)                   Treatment Initiated Date: 12/01/17 (12/05/17 1000) Patient Visit Type: MedOnc (12/05/17 1000) Treatment Phase: First Chemo Tx (12/05/17 1000) Barriers/Navigation Needs: No Questions;No Needs (12/05/17 1000)   Interventions: None required (12/05/17 1000)         met with patient prior to receiving first chemo treatment today. Pt did not have any questions at the time of visit. Pt states is ready for chemo today. Instructed to call if needs anything or has any further questions. Reviewed upcoming appts with patient. Pt verbalized understanding. Nothing further needed at this time.             Time Spent with Patient: 15 (12/05/17 1000)

## 2017-12-06 ENCOUNTER — Ambulatory Visit
Admission: RE | Admit: 2017-12-06 | Discharge: 2017-12-06 | Disposition: A | Discharge status: Home / Self Care | Payer: Medicare Other | Source: Ambulatory Visit | Attending: Radiation Oncology | Admitting: Radiation Oncology

## 2017-12-06 DIAGNOSIS — Z510 Encounter for antineoplastic radiation therapy: Secondary | ICD-10-CM | POA: Diagnosis not present

## 2017-12-07 ENCOUNTER — Ambulatory Visit
Admission: RE | Admit: 2017-12-07 | Discharge: 2017-12-07 | Disposition: A | Discharge status: Home / Self Care | Payer: Medicare Other | Source: Ambulatory Visit | Attending: Radiation Oncology | Admitting: Radiation Oncology

## 2017-12-07 DIAGNOSIS — Z510 Encounter for antineoplastic radiation therapy: Secondary | ICD-10-CM | POA: Diagnosis not present

## 2017-12-08 ENCOUNTER — Ambulatory Visit
Admission: RE | Admit: 2017-12-08 | Discharge: 2017-12-08 | Disposition: A | Discharge status: Home / Self Care | Payer: Medicare Other | Source: Ambulatory Visit | Attending: Radiation Oncology | Admitting: Radiation Oncology

## 2017-12-08 DIAGNOSIS — Z510 Encounter for antineoplastic radiation therapy: Secondary | ICD-10-CM | POA: Diagnosis not present

## 2017-12-09 ENCOUNTER — Ambulatory Visit
Admission: RE | Admit: 2017-12-09 | Discharge: 2017-12-09 | Disposition: A | Discharge status: Home / Self Care | Payer: Medicare Other | Source: Ambulatory Visit | Attending: Radiation Oncology | Admitting: Radiation Oncology

## 2017-12-09 DIAGNOSIS — Z510 Encounter for antineoplastic radiation therapy: Secondary | ICD-10-CM | POA: Diagnosis not present

## 2017-12-11 NOTE — Progress Notes (Signed)
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  INTERVAL HISTORY: Patient returns to clinic today for further evaluation and consideration of cycle 2 of weekly carboplatinum and Taxol.  He tolerated his first treatment well without significant side effects.  He is tolerating daily XRT.  He currently feels well and is asymptomatic.  He has no neurologic complaints.  He denies any recent fevers.  He has a good appetite and his weight has remained stable. He denies any chest pain, shortness of breath, cough, or hemoptysis.  He admits to occasional nausea, but denies any vomiting, constipation, or diarrhea.  He has no urinary complaints.  Patient offers no further specific complaints today.  REVIEW OF SYSTEMS:   Review of Systems  Constitutional: Negative.  Negative for fever, malaise/fatigue and weight loss.  Respiratory: Negative.  Negative for cough, hemoptysis and shortness of breath.   Cardiovascular: Negative.  Negative for chest pain and leg swelling.  Gastrointestinal: Positive for nausea. Negative for abdominal pain and constipation.  Genitourinary: Negative.  Negative for dysuria.  Musculoskeletal: Positive for joint pain. Negative for back pain.  Skin: Negative.  Negative for rash.  Neurological: Negative.  Negative for sensory change, focal weakness, weakness and headaches.  Psychiatric/Behavioral: Negative.  The patient is not nervous/anxious.     As per HPI. Otherwise, a complete review of systems is negative.  PAST MEDICAL HISTORY: Past Medical History:  Diagnosis Date  . Anxiety   . Depression     PAST SURGICAL HISTORY: Past Surgical History:  Procedure Laterality Date    . FLEXIBLE BRONCHOSCOPY N/A 11/11/2017   Procedure: FLEXIBLE BRONCHOSCOPY;  Surgeon: Allyne Gee, MD;  Location: [facility];  Service: Pulmonary;  Laterality: N/A;  . none    . PORTA CATH INSERTION N/A 11/29/2017   Procedure: PORTA CATH INSERTION;  Surgeon: Katha Cabal, MD;  Location: [place] [facility];  Service: Cardiovascular;  Laterality: N/A;    FAMILY HISTORY: Family History  Problem Relation Age of Onset  . Diabetes Mellitus II Mother     ADVANCED DIRECTIVES (Y/N):  N  HEALTH MAINTENANCE: Social History   Tobacco Use  . Smoking status: Current Every Day Smoker    Packs/day: 1.00    Types: Cigarettes  . Smokeless tobacco: Never Used  Substance Use Topics  . Alcohol use: Yes    Comment: alot   . Drug use: Yes     Colonoscopy:  PAP:  Bone density:  Lipid panel:  No Known Allergies  Current Outpatient Medications  Medication Sig Dispense Refill  . lidocaine-prilocaine (EMLA) cream Apply to affected area once 30 g 3  . naproxen sodium (ALEVE) 220 MG tablet Take 220 mg by mouth daily.     . ondansetron (ZOFRAN) 8 MG tablet Take 1 tablet (8 mg total) by mouth 2 (two) times daily as needed for refractory nausea / vomiting. 30 tablet 2  . thiamine 100 MG tablet Take 1 tablet (100 mg total) by mouth daily. 30 tablet 0  . diphenhydrAMINE (BENADRYL) 25 MG tablet Take 25 mg by mouth every morning.    . feeding supplement, ENSURE ENLIVE, (ENSURE ENLIVE) LIQD Take 237 mLs by mouth 3 (three) times daily between meals. (Patient not taking: Reported  on 12/12/2017) 237 mL 12  . prochlorperazine (COMPAZINE) 10 MG tablet Take 1 tablet (10 mg total) by mouth every 6 (six) hours as needed (Nausea or vomiting). (Patient not taking: Reported on 12/12/2017) 60 tablet 2   No current facility-administered medications for this visit.     OBJECTIVE: Vitals:   12/12/17 1026  BP: (!) 114/59  Pulse: 92  Resp: 20  Temp: (!) 96.8 F (36 C)     Body mass index is 17.13 kg/m.     ECOG FS:0 - Asymptomatic  General: Well-developed, well-nourished, no acute distress. Eyes: Pink conjunctiva, anicteric sclera. Lungs: Clear to auscultation bilaterally. Heart: Regular rate and rhythm. No rubs, murmurs, or gallops. Abdomen: Soft, nontender, nondistended. No organomegaly noted, normoactive bowel sounds. Musculoskeletal: No edema, cyanosis, or clubbing. Neuro: Alert, answering all questions appropriately. Cranial nerves grossly intact. Skin: No rashes or petechiae noted. Psych: Normal affect.   LAB RESULTS:  Lab Results  Component Value Date   NA 133 (L) 12/12/2017   K 3.9 12/12/2017   CL 98 (L) 12/12/2017   CO2 25 12/12/2017   GLUCOSE 177 (H) 12/12/2017   BUN 6 12/12/2017   CREATININE 0.61 12/12/2017   CALCIUM 9.7 12/12/2017   PROT 8.2 (H) 12/12/2017   ALBUMIN 3.4 (L) 12/12/2017   AST 27 12/12/2017   ALT 10 (L) 12/12/2017   ALKPHOS 74 12/12/2017   BILITOT 0.5 12/12/2017   GFRNONAA >60 12/12/2017   GFRAA >60 12/12/2017    Lab Results  Component Value Date   WBC 5.1 12/12/2017   NEUTROABS 3.6 12/12/2017   HGB 12.9 (L) 12/12/2017   HCT 37.4 (L) 12/12/2017   MCV 84.2 12/12/2017   PLT 303 12/12/2017     STUDIES: Mr [name] DP Contrast  Result Date: 12/01/2017 CLINICAL DATA:  Malignant neoplasm of lung.  Staging. EXAM: MRI HEAD WITHOUT AND WITH CONTRAST TECHNIQUE: Multiplanar, multiecho pulse sequences of the brain and surrounding structures were obtained without and with intravenous contrast. CONTRAST:  33m MULTIHANCE GADOBENATE DIMEGLUMINE 529 MG/ML IV SOLN COMPARISON:  PET scan 11/16/2017. FINDINGS: Brain: No evidence for acute infarction, hemorrhage, mass lesion, hydrocephalus, or extra-axial fluid. Generalized atrophy. Mild subcortical and periventricular T2 and FLAIR hyperintensities, likely chronic microvascular ischemic change. Periventricular white matter signal abnormality on the LEFT, demonstrates T2 shine through, but does not demonstrate  restriction. Post infusion, no abnormal enhancement of the brain or meninges. Vascular: Flow voids are maintained throughout the carotid, basilar, and vertebral arteries. There are no areas of chronic hemorrhage. Skull and upper cervical spine: Normal marrow signal. No tonsillar herniation. Mild spondylosis C4-C5. Sinuses/Orbits: Paranasal sinuses demonstrate mild mucosal thickening in the ethmoids. Some layering fluid in the larger RIGHT division sphenoid as well as the RIGHT maxillary sinus. Other: BILATERAL layering mastoid effusions. No nasopharyngeal process is evident. IMPRESSION: Atrophy with mild small vessel disease. No acute intracranial findings. No visible intracranial metastatic disease. No osseous lesions are observed. Chronic and acute sinus disease, with layering fluid in the RIGHT maxillary and RIGHT sphenoid regions. Layering mastoid effusions, uncertain significance. Electronically Signed   By: JStaci RighterM.D.   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  PLAN:    1. 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  MRI of the brain reported as negative.  Final pathology results discussed at cancer conference  as well as with pathologist.  Patient will benefit from concurrent chemotherapy with weekly carboplatin and Taxol along with daily XRT.  At the conclusion of his XRT, he will also receive maintenance Infinzi every 2 weeks for up to 12 months.  Proceed with cycle 2 of weekly carboplatinum and Taxol today.  Continue daily XRT completing on January 03, 2018.  Return to clinic in 1 week for further evaluation and consideration of cycle 3. 2.  Renal mass: Highly suspicious for renal cell carcinoma.  Case discussed with urology.  Will continue to monitor and consider possible nephrectomy at the conclusion of his  treatments for his squamous cell carcinoma of lung. 3.  Hyponatremia: Mild, monitor. 4.  Nausea: Continue Zofran as needed.  I spent a total of 30 minutes face-to-face with the patient of which greater than 50% of the visit was spent in counseling and coordination of care as summarized above.   Patient expressed understanding and was in agreement with this plan. He also understands that He can call clinic at any time with any questions, concerns, or complaints.   Cancer Staging Squamous cell lung cancer, right ([place]) Staging form: Lung, AJCC 8th Edition - Clinical: Stage IIIC (cT4, cN3, cM0) - Signed by Lloyd Huger, MD on 11/23/2017   Lloyd Huger, MD   12/16/2017 6:15 PM

## 2017-12-12 ENCOUNTER — Other Ambulatory Visit: Payer: Self-pay

## 2017-12-12 ENCOUNTER — Inpatient Hospital Stay: Payer: Medicare Other

## 2017-12-12 ENCOUNTER — Ambulatory Visit
Admission: RE | Admit: 2017-12-12 | Discharge: 2017-12-12 | Disposition: A | Discharge status: Home / Self Care | Payer: Medicare Other | Source: Ambulatory Visit | Attending: Radiation Oncology | Admitting: Radiation Oncology

## 2017-12-12 ENCOUNTER — Inpatient Hospital Stay (HOSPITAL_BASED_OUTPATIENT_CLINIC_OR_DEPARTMENT_OTHER): Payer: Medicare Other | Admitting: Oncology

## 2017-12-12 VITALS — BP 114/59 | HR 92 | Temp 96.8°F | Resp 20 | Wt 131.6 lb

## 2017-12-12 DIAGNOSIS — C3411 Malignant neoplasm of upper lobe, right bronchus or lung: Secondary | ICD-10-CM

## 2017-12-12 DIAGNOSIS — E871 Hypo-osmolality and hyponatremia: Secondary | ICD-10-CM

## 2017-12-12 DIAGNOSIS — M545 Low back pain: Secondary | ICD-10-CM | POA: Diagnosis not present

## 2017-12-12 DIAGNOSIS — C3491 Malignant neoplasm of unspecified part of right bronchus or lung: Secondary | ICD-10-CM

## 2017-12-12 DIAGNOSIS — F418 Other specified anxiety disorders: Secondary | ICD-10-CM | POA: Diagnosis not present

## 2017-12-12 DIAGNOSIS — N2889 Other specified disorders of kidney and ureter: Secondary | ICD-10-CM

## 2017-12-12 DIAGNOSIS — E46 Unspecified protein-calorie malnutrition: Secondary | ICD-10-CM

## 2017-12-12 DIAGNOSIS — Z79899 Other long term (current) drug therapy: Secondary | ICD-10-CM

## 2017-12-12 DIAGNOSIS — R110 Nausea: Secondary | ICD-10-CM | POA: Diagnosis not present

## 2017-12-12 DIAGNOSIS — F17210 Nicotine dependence, cigarettes, uncomplicated: Secondary | ICD-10-CM

## 2017-12-12 DIAGNOSIS — Z510 Encounter for antineoplastic radiation therapy: Secondary | ICD-10-CM | POA: Diagnosis not present

## 2017-12-12 DIAGNOSIS — Z5111 Encounter for antineoplastic chemotherapy: Secondary | ICD-10-CM | POA: Diagnosis not present

## 2017-12-12 LAB — COMPREHENSIVE METABOLIC PANEL
ALK PHOS: 74 U/L (ref 38–126)
ALT: 10 U/L — AB (ref 17–63)
AST: 27 U/L (ref 15–41)
Albumin: 3.4 g/dL — ABNORMAL LOW (ref 3.5–5.0)
Anion gap: 10 (ref 5–15)
BUN: 6 mg/dL (ref 6–20)
CHLORIDE: 98 mmol/L — AB (ref 101–111)
CO2: 25 mmol/L (ref 22–32)
CREATININE: 0.61 mg/dL (ref 0.61–1.24)
Calcium: 9.7 mg/dL (ref 8.9–10.3)
GFR calc Af Amer: >60 mL/min (ref >60)
Glucose, Bld: 177 mg/dL — ABNORMAL HIGH (ref 65–99)
Potassium: 3.9 mmol/L (ref 3.5–5.1)
Sodium: 133 mmol/L — ABNORMAL LOW (ref 135–145)
Total Bilirubin: 0.5 mg/dL (ref 0.3–1.2)
Total Protein: 8.2 g/dL — ABNORMAL HIGH (ref 6.5–8.1)

## 2017-12-12 LAB — CBC WITH DIFFERENTIAL/PLATELET
Basophils Absolute: 0.1 10*3/uL (ref 0–0.1)
Basophils Relative: 1 %
EOS ABS: 0.1 10*3/uL (ref 0–0.7)
EOS PCT: 2 %
HCT: 37.4 % — ABNORMAL LOW (ref 40.0–52.0)
Hemoglobin: 12.9 g/dL — ABNORMAL LOW (ref 13.0–18.0)
LYMPHS ABS: 0.7 10*3/uL — AB (ref 1.0–3.6)
Lymphocytes Relative: 14 %
MCH: 29.1 pg (ref 26.0–34.0)
MCHC: 34.6 g/dL (ref 32.0–36.0)
MCV: 84.2 fL (ref 80.0–100.0)
Monocytes Absolute: 0.6 10*3/uL (ref 0.2–1.0)
Monocytes Relative: 13 %
Neutro Abs: 3.6 10*3/uL (ref 1.4–6.5)
Neutrophils Relative %: 70 %
PLATELETS: 303 10*3/uL (ref 150–440)
RBC: 4.44 MIL/uL (ref 4.40–5.90)
RDW: 14.2 % (ref 11.5–14.5)
WBC: 5.1 10*3/uL (ref 3.8–10.6)

## 2017-12-12 MED ORDER — HEPARIN SOD (PORK) LOCK FLUSH 100 UNIT/ML IV SOLN
500.0000 [IU] | Freq: Once | INTRAVENOUS | Status: AC
  Administered 2017-12-12: 500 [IU] via INTRAVENOUS
  Filled 2017-12-12: qty 5

## 2017-12-12 MED ORDER — DEXAMETHASONE SODIUM PHOSPHATE 10 MG/ML IJ SOLN
10.0000 mg | Freq: Once | INTRAMUSCULAR | Status: AC
  Administered 2017-12-12: 10 mg via INTRAVENOUS
  Filled 2017-12-12: qty 1

## 2017-12-12 MED ORDER — FAMOTIDINE IN NACL 20-0.9 MG/50ML-% IV SOLN
20.0000 mg | Freq: Once | INTRAVENOUS | Status: AC
  Administered 2017-12-12: 20 mg via INTRAVENOUS
  Filled 2017-12-12: qty 50

## 2017-12-12 MED ORDER — SODIUM CHLORIDE 0.9 % IV SOLN
Freq: Once | INTRAVENOUS | Status: AC
  Administered 2017-12-12: 12:00:00 via INTRAVENOUS
  Filled 2017-12-12: qty 1000

## 2017-12-12 MED ORDER — PALONOSETRON HCL INJECTION 0.25 MG/5ML
0.2500 mg | Freq: Once | INTRAVENOUS | Status: AC
  Administered 2017-12-12: 0.25 mg via INTRAVENOUS
  Filled 2017-12-12: qty 5

## 2017-12-12 MED ORDER — HEPARIN SOD (PORK) LOCK FLUSH 100 UNIT/ML IV SOLN: 500.0000 [IU] | Freq: Once | INTRAVENOUS | Status: DC | PRN

## 2017-12-12 MED ORDER — DIPHENHYDRAMINE HCL 50 MG/ML IJ SOLN
25.0000 mg | Freq: Once | INTRAMUSCULAR | Status: AC
  Administered 2017-12-12: 25 mg via INTRAVENOUS
  Filled 2017-12-12: qty 1

## 2017-12-12 MED ORDER — SODIUM CHLORIDE 0.9% FLUSH
10.0000 mL | INTRAVENOUS | Status: DC | PRN
  Filled 2017-12-12: qty 10

## 2017-12-12 MED ORDER — PACLITAXEL CHEMO INJECTION 300 MG/50ML
45.0000 mg/m2 | Freq: Once | INTRAVENOUS | Status: AC
  Administered 2017-12-12: 78 mg via INTRAVENOUS
  Filled 2017-12-12: qty 13

## 2017-12-12 MED ORDER — SODIUM CHLORIDE 0.9% FLUSH
10.0000 mL | INTRAVENOUS | Status: DC | PRN
  Administered 2017-12-12: 10 mL via INTRAVENOUS
  Filled 2017-12-12: qty 10

## 2017-12-12 MED ORDER — DEXAMETHASONE SODIUM PHOSPHATE 100 MG/10ML IJ SOLN: 10.0000 mg | Freq: Once | INTRAMUSCULAR | Status: DC

## 2017-12-12 MED ORDER — CARBOPLATIN CHEMO INJECTION 450 MG/45ML
210.0000 mg | Freq: Once | INTRAVENOUS | Status: AC
  Administered 2017-12-12: 210 mg via INTRAVENOUS
  Filled 2017-12-12: qty 21

## 2017-12-12 NOTE — Progress Notes (Signed)
Here for follow up . Stated overall doing " good but every now and then I vomit " taking Zofran when he remembers and it helps. Pain in R shoulder / 6 level resolved w Aleve took this am.

## 2017-12-13 ENCOUNTER — Ambulatory Visit
Admission: RE | Admit: 2017-12-13 | Discharge: 2017-12-13 | Disposition: A | Discharge status: Home / Self Care | Payer: Medicare Other | Source: Ambulatory Visit | Attending: Radiation Oncology | Admitting: Radiation Oncology

## 2017-12-13 DIAGNOSIS — Z510 Encounter for antineoplastic radiation therapy: Secondary | ICD-10-CM | POA: Diagnosis not present

## 2017-12-14 ENCOUNTER — Ambulatory Visit
Admission: RE | Admit: 2017-12-14 | Discharge: 2017-12-14 | Disposition: A | Discharge status: Home / Self Care | Payer: Medicare Other | Source: Ambulatory Visit | Attending: Radiation Oncology | Admitting: Radiation Oncology

## 2017-12-14 ENCOUNTER — Encounter: Payer: Self-pay | Admitting: *Deleted

## 2017-12-14 DIAGNOSIS — Z510 Encounter for antineoplastic radiation therapy: Secondary | ICD-10-CM | POA: Diagnosis not present

## 2017-12-14 NOTE — Progress Notes (Signed)
  Oncology Nurse Navigator Documentation  Navigator Location: [facility] (12/14/17 0900)   )Navigator Encounter Type: Lobby (12/14/17 0900)                     Patient Visit Type: RadOnc (12/14/17 0900) Treatment Phase: Treatment (12/14/17 0900)     Interventions: Referrals (12/14/17 0900) Referrals: Other(Primary Care) (12/14/17 0900)         assisted pt in getting established with primary care at [facility] in [place]. Pt scheduled with Dr. Baltazar Apo on 7/25 at 2pm, arrive at 1:30pm. Records faxed to clinic. Pt made aware of appt and informed that if needs help with transportation to appt to inform our office to get taxi voucher. Nothing further needed at this time.            Time Spent with Patient: 30 (12/14/17 0900)

## 2017-12-15 ENCOUNTER — Ambulatory Visit
Admission: RE | Admit: 2017-12-15 | Discharge: 2017-12-15 | Disposition: A | Discharge status: Home / Self Care | Payer: Medicare Other | Source: Ambulatory Visit | Attending: Radiation Oncology | Admitting: Radiation Oncology

## 2017-12-15 DIAGNOSIS — Z510 Encounter for antineoplastic radiation therapy: Secondary | ICD-10-CM | POA: Diagnosis not present

## 2017-12-16 ENCOUNTER — Ambulatory Visit
Admission: RE | Admit: 2017-12-16 | Discharge: 2017-12-16 | Disposition: A | Discharge status: Home / Self Care | Payer: Medicare Other | Source: Ambulatory Visit | Attending: Radiation Oncology | Admitting: Radiation Oncology

## 2017-12-16 DIAGNOSIS — Z510 Encounter for antineoplastic radiation therapy: Secondary | ICD-10-CM | POA: Diagnosis not present

## 2017-12-19 ENCOUNTER — Ambulatory Visit: Payer: Medicare Other

## 2017-12-19 ENCOUNTER — Inpatient Hospital Stay: Payer: Medicare Other

## 2017-12-19 ENCOUNTER — Inpatient Hospital Stay (HOSPITAL_BASED_OUTPATIENT_CLINIC_OR_DEPARTMENT_OTHER): Payer: Medicare Other | Admitting: Oncology

## 2017-12-19 ENCOUNTER — Encounter: Payer: Self-pay | Admitting: Oncology

## 2017-12-19 VITALS — BP 106/71 | HR 96 | Temp 97.7°F | Resp 20 | Wt 129.2 lb

## 2017-12-19 DIAGNOSIS — E46 Unspecified protein-calorie malnutrition: Secondary | ICD-10-CM

## 2017-12-19 DIAGNOSIS — F418 Other specified anxiety disorders: Secondary | ICD-10-CM | POA: Diagnosis not present

## 2017-12-19 DIAGNOSIS — N2889 Other specified disorders of kidney and ureter: Secondary | ICD-10-CM

## 2017-12-19 DIAGNOSIS — C3411 Malignant neoplasm of upper lobe, right bronchus or lung: Secondary | ICD-10-CM | POA: Diagnosis not present

## 2017-12-19 DIAGNOSIS — Z5111 Encounter for antineoplastic chemotherapy: Secondary | ICD-10-CM

## 2017-12-19 DIAGNOSIS — R1310 Dysphagia, unspecified: Secondary | ICD-10-CM | POA: Diagnosis not present

## 2017-12-19 DIAGNOSIS — E860 Dehydration: Secondary | ICD-10-CM

## 2017-12-19 DIAGNOSIS — C3491 Malignant neoplasm of unspecified part of right bronchus or lung: Secondary | ICD-10-CM

## 2017-12-19 DIAGNOSIS — R110 Nausea: Secondary | ICD-10-CM | POA: Diagnosis not present

## 2017-12-19 DIAGNOSIS — F17210 Nicotine dependence, cigarettes, uncomplicated: Secondary | ICD-10-CM

## 2017-12-19 DIAGNOSIS — M545 Low back pain: Secondary | ICD-10-CM

## 2017-12-19 DIAGNOSIS — Z79899 Other long term (current) drug therapy: Secondary | ICD-10-CM

## 2017-12-19 DIAGNOSIS — E871 Hypo-osmolality and hyponatremia: Secondary | ICD-10-CM

## 2017-12-19 LAB — CBC WITH DIFFERENTIAL/PLATELET
BASOS ABS: 0 10*3/uL (ref 0–0.1)
Basophils Relative: 1 %
EOS ABS: 0.1 10*3/uL (ref 0–0.7)
EOS PCT: 3 %
HCT: 38.8 % — ABNORMAL LOW (ref 40.0–52.0)
Hemoglobin: 13.3 g/dL (ref 13.0–18.0)
LYMPHS PCT: 21 %
Lymphs Abs: 0.6 10*3/uL — ABNORMAL LOW (ref 1.0–3.6)
MCH: 27.7 pg (ref 26.0–34.0)
MCHC: 34.2 g/dL (ref 32.0–36.0)
MCV: 80.9 fL (ref 80.0–100.0)
MONO ABS: 0.5 10*3/uL (ref 0.2–1.0)
Monocytes Relative: 19 %
Neutro Abs: 1.6 10*3/uL (ref 1.4–6.5)
Neutrophils Relative %: 56 %
PLATELETS: 273 10*3/uL (ref 150–440)
RBC: 4.79 MIL/uL (ref 4.40–5.90)
RDW: 14.4 % (ref 11.5–14.5)
WBC: 2.9 10*3/uL — AB (ref 3.8–10.6)

## 2017-12-19 LAB — COMPREHENSIVE METABOLIC PANEL
ALT: 14 U/L — AB (ref 17–63)
AST: 27 U/L (ref 15–41)
Albumin: 3.4 g/dL — ABNORMAL LOW (ref 3.5–5.0)
Alkaline Phosphatase: 70 U/L (ref 38–126)
Anion gap: 9 (ref 5–15)
BUN: 10 mg/dL (ref 6–20)
CO2: 24 mmol/L (ref 22–32)
CREATININE: 0.68 mg/dL (ref 0.61–1.24)
Calcium: 9.6 mg/dL (ref 8.9–10.3)
Chloride: 99 mmol/L — ABNORMAL LOW (ref 101–111)
GFR calc non Af Amer: >60 mL/min (ref >60)
GLUCOSE: 128 mg/dL — AB (ref 65–99)
POTASSIUM: 3.8 mmol/L (ref 3.5–5.1)
SODIUM: 132 mmol/L — AB (ref 135–145)
TOTAL PROTEIN: 8.5 g/dL — AB (ref 6.5–8.1)
Total Bilirubin: 0.4 mg/dL (ref 0.3–1.2)

## 2017-12-19 MED ORDER — SODIUM CHLORIDE 0.9 % IV SOLN
Freq: Once | INTRAVENOUS | Status: AC
  Administered 2017-12-19: 12:00:00 via INTRAVENOUS
  Filled 2017-12-19: qty 1000

## 2017-12-19 MED ORDER — SODIUM CHLORIDE 0.9 % IV SOLN
210.0000 mg | Freq: Once | INTRAVENOUS | Status: AC
  Administered 2017-12-19: 210 mg via INTRAVENOUS
  Filled 2017-12-19: qty 21

## 2017-12-19 MED ORDER — SODIUM CHLORIDE 0.9 % IV SOLN
45.0000 mg/m2 | Freq: Once | INTRAVENOUS | Status: AC
  Administered 2017-12-19: 78 mg via INTRAVENOUS
  Filled 2017-12-19: qty 13

## 2017-12-19 MED ORDER — NYSTATIN 100000 UNIT/ML MT SUSP: 5.0000 mL | Freq: Four times a day (QID) | OROMUCOSAL | 0 refills | Status: DC

## 2017-12-19 MED ORDER — DEXAMETHASONE SODIUM PHOSPHATE 10 MG/ML IJ SOLN
10.0000 mg | Freq: Once | INTRAMUSCULAR | Status: AC
  Administered 2017-12-19: 10 mg via INTRAVENOUS
  Filled 2017-12-19: qty 1

## 2017-12-19 MED ORDER — SODIUM CHLORIDE 0.9 % IV SOLN
INTRAVENOUS | Status: DC
  Administered 2017-12-19: 12:00:00 via INTRAVENOUS
  Filled 2017-12-19 (×2): qty 1000

## 2017-12-19 MED ORDER — PALONOSETRON HCL INJECTION 0.25 MG/5ML
0.2500 mg | Freq: Once | INTRAVENOUS | Status: AC
Start: 2017-12-19 — End: 2017-12-19
  Administered 2017-12-19: 0.25 mg via INTRAVENOUS
  Filled 2017-12-19: qty 5

## 2017-12-19 MED ORDER — HEPARIN SOD (PORK) LOCK FLUSH 100 UNIT/ML IV SOLN
500.0000 [IU] | Freq: Once | INTRAVENOUS | Status: AC | PRN
  Administered 2017-12-19: 500 [IU]

## 2017-12-19 MED ORDER — SODIUM CHLORIDE 0.9 % IV SOLN: 10.0000 mg | Freq: Once | INTRAVENOUS | Status: DC

## 2017-12-19 MED ORDER — DIPHENHYDRAMINE HCL 50 MG/ML IJ SOLN
25.0000 mg | Freq: Once | INTRAMUSCULAR | Status: AC
  Administered 2017-12-19: 25 mg via INTRAVENOUS
  Filled 2017-12-19: qty 1

## 2017-12-19 MED ORDER — FAMOTIDINE IN NACL 20-0.9 MG/50ML-% IV SOLN
20.0000 mg | Freq: Once | INTRAVENOUS | Status: AC
  Administered 2017-12-19: 20 mg via INTRAVENOUS
  Filled 2017-12-19: qty 50

## 2017-12-19 NOTE — Progress Notes (Signed)
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  INTERVAL HISTORY: Patient returns today for further evaluation and consideration of cycle 3 of weekly carbo/Taxol.  Has tolerated his first 2 treatments well without significant side effects.  He is tolerating radiation well.  Starting approximately 3 days ago he noticed  intermittent dysphasia while eating or drinking.  States he has intermittent coughing spells which caused him to vomit.  Today he feels well and is asymptomatic.  Notes his appetite to be fair.  Denies any recent fevers, chest pain, shortness of breath, cough or hemoptysis.  Has regular daily bowel movements.  REVIEW OF SYSTEMS:   Review of Systems  Constitutional: Positive for malaise/fatigue. Negative for chills, fever and weight loss.  HENT: Negative for congestion and ear pain.   Eyes: Negative.  Negative for blurred vision and double vision.  Respiratory: Positive for cough. Negative for sputum production and shortness of breath.   Cardiovascular: Negative.  Negative for chest pain, palpitations and leg swelling.  Gastrointestinal: Positive for vomiting (With mild dysphasia). Negative for abdominal pain, constipation, diarrhea and nausea.  Genitourinary: Negative for dysuria, frequency and urgency.  Musculoskeletal: Negative for back pain and falls.  Skin: Negative.  Negative for rash.  Neurological: Negative.  Negative for weakness and headaches.  Endo/Heme/Allergies: Negative.  Does not bruise/bleed easily.  Psychiatric/Behavioral: Negative.  Negative for depression. The patient is not nervous/anxious and does not have insomnia.     As per HPI. Otherwise, a complete  review of systems is negative.  PAST MEDICAL HISTORY: Past Medical History:  Diagnosis Date  . Anxiety   . Depression     PAST SURGICAL HISTORY: Past Surgical History:  Procedure Laterality Date  . FLEXIBLE BRONCHOSCOPY N/A 11/11/2017   Procedure: FLEXIBLE BRONCHOSCOPY;  Surgeon: Allyne Gee, MD;  Location: [facility];  Service: Pulmonary;  Laterality: N/A;  . none    . PORTA CATH INSERTION N/A 11/29/2017   Procedure: PORTA CATH INSERTION;  Surgeon: Katha Cabal, MD;  Location: [place] [facility];  Service: Cardiovascular;  Laterality: N/A;    FAMILY HISTORY: Family History  Problem Relation Age of Onset  . Diabetes Mellitus II Mother     ADVANCED DIRECTIVES (Y/N):  N  HEALTH MAINTENANCE: Social History   Tobacco Use  . Smoking status: Current Every Day Smoker    Packs/day: 1.00    Types: Cigarettes  . Smokeless tobacco: Never Used  Substance Use Topics  . Alcohol use: Yes    Comment: alot   . Drug use: Yes     Colonoscopy:  PAP:  Bone density:  Lipid panel:  No Known Allergies  Current Outpatient Medications  Medication Sig Dispense Refill  . diphenhydrAMINE (BENADRYL) 25 MG tablet Take 25 mg by mouth every morning.    . feeding supplement, ENSURE ENLIVE, (ENSURE ENLIVE) LIQD Take 237 mLs by mouth 3 (three) times daily between meals. (Patient not taking: Reported on 12/12/2017) 237 mL 12  . lidocaine-prilocaine (EMLA) cream Apply to affected area once 30 g 3  . naproxen sodium (ALEVE) 220 MG tablet Take 220 mg by mouth daily.     . ondansetron (ZOFRAN) 8 MG tablet Take 1 tablet (8 mg total) by mouth 2 (  two) times daily as needed for refractory nausea / vomiting. 30 tablet 2  . prochlorperazine (COMPAZINE) 10 MG tablet Take 1 tablet (10 mg total) by mouth every 6 (six) hours as needed (Nausea or vomiting). (Patient not taking: Reported on 12/12/2017) 60 tablet 2  . thiamine 100 MG tablet Take 1 tablet (100 mg total) by mouth daily. 30 tablet 0   No  current facility-administered medications for this visit.     OBJECTIVE: There were no vitals filed for this visit.   There is no height or weight on file to calculate BMI.    ECOG FS:0 - Asymptomatic  Physical Exam  Constitutional: He is oriented to person, place, and time. Vital signs are normal. He appears malnourished.  HENT:  Head: Normocephalic and atraumatic.  Eyes: Pupils are equal, round, and reactive to light.  Neck: Normal range of motion.  Cardiovascular: Normal rate, regular rhythm and normal heart sounds.  No murmur heard. Pulmonary/Chest: Effort normal and breath sounds normal. He has no wheezes.  Abdominal: Soft. Normal appearance and bowel sounds are normal. He exhibits no distension. There is no tenderness.  Musculoskeletal: Normal range of motion. He exhibits no edema.  Neurological: He is alert and oriented to person, place, and time. Gait normal.  Skin: Skin is warm and dry. No rash noted.  Psychiatric: Mood, memory, affect and judgment normal.     LAB RESULTS:  Lab Results  Component Value Date   NA 133 (L) 12/12/2017   K 3.9 12/12/2017   CL 98 (L) 12/12/2017   CO2 25 12/12/2017   GLUCOSE 177 (H) 12/12/2017   BUN 6 12/12/2017   CREATININE 0.61 12/12/2017   CALCIUM 9.7 12/12/2017   PROT 8.2 (H) 12/12/2017   ALBUMIN 3.4 (L) 12/12/2017   AST 27 12/12/2017   ALT 10 (L) 12/12/2017   ALKPHOS 74 12/12/2017   BILITOT 0.5 12/12/2017   GFRNONAA >60 12/12/2017   GFRAA >60 12/12/2017    Lab Results  Component Value Date   WBC 5.1 12/12/2017   NEUTROABS 3.6 12/12/2017   HGB 12.9 (L) 12/12/2017   HCT 37.4 (L) 12/12/2017   MCV 84.2 12/12/2017   PLT 303 12/12/2017     STUDIES: Mr [name] EL Contrast  Result Date: 12/01/2017 CLINICAL DATA:  Malignant neoplasm of lung.  Staging. EXAM: MRI HEAD WITHOUT AND WITH CONTRAST TECHNIQUE: Multiplanar, multiecho pulse sequences of the brain and surrounding structures were obtained without and with intravenous  contrast. CONTRAST:  40m MULTIHANCE GADOBENATE DIMEGLUMINE 529 MG/ML IV SOLN COMPARISON:  PET scan 11/16/2017. FINDINGS: Brain: No evidence for acute infarction, hemorrhage, mass lesion, hydrocephalus, or extra-axial fluid. Generalized atrophy. Mild subcortical and periventricular T2 and FLAIR hyperintensities, likely chronic microvascular ischemic change. Periventricular white matter signal abnormality on the LEFT, demonstrates T2 shine through, but does not demonstrate restriction. Post infusion, no abnormal enhancement of the brain or meninges. Vascular: Flow voids are maintained throughout the carotid, basilar, and vertebral arteries. There are no areas of chronic hemorrhage. Skull and upper cervical spine: Normal marrow signal. No tonsillar herniation. Mild spondylosis C4-C5. Sinuses/Orbits: Paranasal sinuses demonstrate mild mucosal thickening in the ethmoids. Some layering fluid in the larger RIGHT division sphenoid as well as the RIGHT maxillary sinus. Other: BILATERAL layering mastoid effusions. No nasopharyngeal process is evident. IMPRESSION: Atrophy with mild small vessel disease. No acute intracranial findings. No visible intracranial metastatic disease. No osseous lesions are observed. Chronic and acute sinus disease, with layering fluid in the RIGHT maxillary and RIGHT sphenoid  regions. Layering mastoid effusions, uncertain significance. Electronically Signed   By: Staci Righter M.D.   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  PLAN:    1. 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  Has medical history of smoking, alcohol abuse and prior necrotizing pneumonia in February 2019.  Chest x-ray showed concerns for right upper lobe consolidation concerning for  recurring necrotizing pneumonia versus an atypical infection or malignancy.  Had CT chest concerning for necrotizing infection or malignancy.  He was admitted to the hospital and given IV antibiotics.  Had bronchoscopy performed by Dr. Chancy Milroy revealing bronchus mass.  He was started on 21 days of Augmentin.  He was set up with Dr. Grayland Ormond for PET scan and results of biopsy.  PET scan 11/16/17 revealed large necrotic right atypical lung mass with marked hypermetabolism consistent with a neoplastic process.  A 12 mm hypermetabolic subclavicular lymph node.  2.2 cm right hypermetabolic renal lesion suspicious for primary renal cell carcinoma.   Consulted with Dr. Donella Stade on 11/18/2017.  He recommended a split course of radiation given the close proximity to the esophagus and right subclavicular node involvement.       Squamous cell lung cancer, right ([place])   11/17/2017 Initial Diagnosis    Squamous cell lung cancer, right ([place])      11/17/2017 Cancer Staging    Staging form: Lung, AJCC 8th Edition - Clinical: Stage IIIC (cT4, cN3, cM0) - Signed by Lloyd Huger, MD on 11/23/2017      11/23/2017 -  Chemotherapy    The patient had palonosetron (ALOXI) injection 0.25 mg, 0.25 mg, Intravenous,  Once, 3 of 8 cycles Administration: 0.25 mg (12/05/2017), 0.25 mg (12/12/2017) CARBOplatin (PARAPLATIN) 210 mg in sodium chloride 0.9 % 250 mL chemo infusion, 210 mg (95.5 % of original dose 217 mg), Intravenous,  Once, 3 of 8 cycles Dose modification:   (original dose 217 mg, Cycle 1) Administration: 210 mg (12/05/2017), 210 mg (12/12/2017) PACLitaxel (TAXOL) 78 mg in sodium chloride 0.9 % 250 mL chemo infusion (</= 28m/m2), 45 mg/m2 = 78 mg, Intravenous,  Once, 3 of 8 cycles Administration: 78 mg (12/05/2017), 78 mg (12/12/2017)  for chemotherapy treatment.       Proceed today with cycle 3 of weekly carbo/Taxol.  Continue daily radiation that will be completed on 01/03/2018.  RTC In 1 week for further  evaluation and consideration of cycle 4.  2.  Hyponatremia: Sodium level 132 today.  Patient will receive 1 L NaCl with treatment today and 1 L NaCl on Thursday.  3.  Dehydration: He will receive additional fluids today and will return to clinic on Thursday for 1 L NaCl.  Had soft blood pressure and intermittent dysphasia. 4.  Nausea: Continue antiemetics as prescribed. 5.  Malnutrition: Have scheduled an appointment for him to meet with Jolie while receiving fluids on Thursday.  He was given 8 Ensures to take home to get him through to Thursday. 6.  Dysphasia: No thrush present on examination.  Cannot rule out esophagitis.  Patient may benefit from nystatin. RX nystatin 5 ml QID for esophagitis from radiation.  [facility] [employer] [facility] [employer]   Greater than 50% was spent in counseling and coordination of care with this  patient including but not limited to discussion of the relevant topics above (See A&P) including, but not limited to diagnosis and management of acute and chronic medical conditions.   Patient expressed understanding and was in agreement with this plan. He also understands that He can call clinic at any time with any questions, concerns, or complaints.   Cancer Staging Squamous cell lung cancer, right ([place]) Staging form: Lung, AJCC 8th Edition - Clinical: Stage IIIC (cT4, cN3, cM0) - Signed by Lloyd Huger, MD on 11/23/2017   Jacquelin Hawking, NP   12/19/2017 10:32 AM

## 2017-12-19 NOTE — Progress Notes (Signed)
Patient here today for follow up and treatment regarding lung cancer. Patient reports increased difficulty swallowing the past few days.

## 2017-12-20 ENCOUNTER — Ambulatory Visit
Admission: RE | Admit: 2017-12-20 | Discharge: 2017-12-20 | Disposition: A | Discharge status: Home / Self Care | Payer: Medicare Other | Source: Ambulatory Visit | Attending: Radiation Oncology | Admitting: Radiation Oncology

## 2017-12-20 DIAGNOSIS — Z510 Encounter for antineoplastic radiation therapy: Secondary | ICD-10-CM | POA: Diagnosis not present

## 2017-12-21 ENCOUNTER — Ambulatory Visit: Payer: Medicare Other | Admitting: Oncology

## 2017-12-21 ENCOUNTER — Ambulatory Visit
Admission: RE | Admit: 2017-12-21 | Discharge: 2017-12-21 | Disposition: A | Discharge status: Home / Self Care | Payer: Medicare Other | Source: Ambulatory Visit | Attending: Radiation Oncology | Admitting: Radiation Oncology

## 2017-12-21 ENCOUNTER — Other Ambulatory Visit: Payer: Medicare Other

## 2017-12-21 DIAGNOSIS — Z510 Encounter for antineoplastic radiation therapy: Secondary | ICD-10-CM | POA: Diagnosis not present

## 2017-12-22 ENCOUNTER — Ambulatory Visit
Admission: RE | Admit: 2017-12-22 | Discharge: 2017-12-22 | Disposition: A | Discharge status: Home / Self Care | Payer: Medicare Other | Source: Ambulatory Visit | Attending: Radiation Oncology | Admitting: Radiation Oncology

## 2017-12-22 ENCOUNTER — Inpatient Hospital Stay: Payer: Medicare Other

## 2017-12-22 VITALS — BP 110/70 | HR 80 | Temp 97.0°F | Resp 20

## 2017-12-22 DIAGNOSIS — Z510 Encounter for antineoplastic radiation therapy: Secondary | ICD-10-CM | POA: Diagnosis not present

## 2017-12-22 DIAGNOSIS — Z5111 Encounter for antineoplastic chemotherapy: Secondary | ICD-10-CM | POA: Diagnosis not present

## 2017-12-22 DIAGNOSIS — C801 Malignant (primary) neoplasm, unspecified: Secondary | ICD-10-CM

## 2017-12-22 MED ORDER — SODIUM CHLORIDE 0.9 % IV SOLN
Freq: Once | INTRAVENOUS | Status: AC
  Administered 2017-12-22: 11:00:00 via INTRAVENOUS
  Filled 2017-12-22: qty 1000

## 2017-12-22 MED ORDER — HEPARIN SOD (PORK) LOCK FLUSH 100 UNIT/ML IV SOLN
500.0000 [IU] | Freq: Once | INTRAVENOUS | Status: AC
  Administered 2017-12-22: 500 [IU] via INTRAVENOUS

## 2017-12-22 MED ORDER — SODIUM CHLORIDE 0.9% FLUSH
10.0000 mL | INTRAVENOUS | Status: DC | PRN
  Administered 2017-12-22: 10 mL via INTRAVENOUS
  Filled 2017-12-22: qty 10

## 2017-12-22 MED ORDER — SODIUM CHLORIDE 0.9 % IV SOLN
INTRAVENOUS | Status: DC
  Filled 2017-12-22: qty 1000

## 2017-12-22 MED ORDER — HEPARIN SOD (PORK) LOCK FLUSH 100 UNIT/ML IV SOLN
INTRAVENOUS | Status: AC
  Filled 2017-12-22: qty 5

## 2017-12-22 NOTE — Progress Notes (Signed)
Nutrition Assessment   Reason for Assessment:   Weight loss  ASSESSMENT:  62 year old male with lung cancer. Patient receiving chemotherapy and radiation therapy.  Past medical history of depression and anxiety.  Met with patient prior to radiation this am.  Patient reports poor appetite, some nausea but medicine helps.  Patient reports trouble swallowing but nystatin has helped (noted ? Esophagitis).  Patient reports that he typically does not eat anything until lunch time.  It consists of hamburger or sandwich of some type, pudding.  Supper usually snacks on potato chips or may eat another sandwich.  Reports wife prepares most of meals.  Reports that he drinks ensure 2 times per day.    Reports normal bowel function.    Nutrition Focused Physical Exam:  Nutrition-Focused physical exam completed. Findings are severe all areas fat depletion, moderate thigh area, severe temple, severe buccal, severe clavicle, moderate shoulder,moderate hand region muscle depletion.    Medications: zofran and compazine, thamine  Labs: Na 132, glucose 128  Anthropometrics:   Height: 73.5 inches Weight: 129 lb 3.2 oz UBW: 160 lb per patient but that was months ago.  Noted weight 2/19 144 lb BMI: 16  19% weight loss in the last 4 months, significant   Estimated Energy Needs  Kcals: 1770-2000 calories/d Protein: 89-100 g/d Fluid: 2 L/d  NUTRITION DIAGNOSIS: Malnutrition related to cancer related side effects, poor appetite, mild nausea, esophagitis as evidenced by 19% weight loss, severe fat and muscle depletion   MALNUTRITION DIAGNOSIS: Patient meets criteria for severe malnutrition in chronic illness as evidenced by 19% weight loss, severe fat and muscle depletion   INTERVENTION:   Discussed strategies to increase calories and protein. Handout given. Discussed soft moist protein foods, handout given Provided patient with 1st case of ensure enlive.  ENcouraged 2-3 per day. Coupons given as  well. Contact information given    MONITORING, EVALUATION, GOAL: weight trends, intake   NEXT VISIT: July 15  Zvi Duplantis B. Zenia Resides, [place], [place] Registered Dietitian [phone] (pager)

## 2017-12-23 ENCOUNTER — Ambulatory Visit
Admission: RE | Admit: 2017-12-23 | Discharge: 2017-12-23 | Disposition: A | Discharge status: Home / Self Care | Payer: Medicare Other | Source: Ambulatory Visit | Attending: Radiation Oncology | Admitting: Radiation Oncology

## 2017-12-23 DIAGNOSIS — Z510 Encounter for antineoplastic radiation therapy: Secondary | ICD-10-CM | POA: Diagnosis not present

## 2017-12-25 LAB — ACID FAST CULTURE WITH REFLEXED SENSITIVITIES: ACID FAST CULTURE - AFSCU3: NEGATIVE

## 2017-12-25 NOTE — Progress Notes (Deleted)
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  INTERVAL HISTORY: Patient returns to clinic today for further evaluation and consideration of cycle 2 of weekly carboplatinum and Taxol.  He tolerated his first treatment well without significant side effects.  He is tolerating daily XRT.  He currently feels well and is asymptomatic.  He has no neurologic complaints.  He denies any recent fevers.  He has a good appetite and his weight has remained stable. He denies any chest pain, shortness of breath, cough, or hemoptysis.  He admits to occasional nausea, but denies any vomiting, constipation, or diarrhea.  He has no urinary complaints.  Patient offers no further specific complaints today.  REVIEW OF SYSTEMS:   Review of Systems  Constitutional: Negative.  Negative for fever, malaise/fatigue and weight loss.  Respiratory: Negative.  Negative for cough, hemoptysis and shortness of breath.   Cardiovascular: Negative.  Negative for chest pain and leg swelling.  Gastrointestinal: Positive for nausea. Negative for abdominal pain and constipation.  Genitourinary: Negative.  Negative for dysuria.  Musculoskeletal: Positive for joint pain. Negative for back pain.  Skin: Negative.  Negative for rash.  Neurological: Negative.  Negative for sensory change, focal weakness, weakness and headaches.  Psychiatric/Behavioral: Negative.  The patient is not nervous/anxious.     As per HPI. Otherwise, a complete review of systems is negative.  PAST MEDICAL HISTORY: Past Medical History:  Diagnosis Date  . Anxiety   . Depression     PAST SURGICAL HISTORY: Past Surgical History:  Procedure Laterality Date    . FLEXIBLE BRONCHOSCOPY N/A 11/11/2017   Procedure: FLEXIBLE BRONCHOSCOPY;  Surgeon: Allyne Gee, MD;  Location: [facility] ORS;  Service: Pulmonary;  Laterality: N/A;  . none    . PORTA CATH INSERTION N/A 11/29/2017   Procedure: PORTA CATH INSERTION;  Surgeon: Katha Cabal, MD;  Location: [place] [facility];  Service: Cardiovascular;  Laterality: N/A;    FAMILY HISTORY: Family History  Problem Relation Age of Onset  . Diabetes Mellitus II Mother     ADVANCED DIRECTIVES (Y/N):  N  HEALTH MAINTENANCE: Social History   Tobacco Use  . Smoking status: Current Every Day Smoker    Packs/day: 1.00    Types: Cigarettes  . Smokeless tobacco: Never Used  Substance Use Topics  . Alcohol use: Yes    Comment: alot   . Drug use: Yes     Colonoscopy:  PAP:  Bone density:  Lipid panel:  No Known Allergies  Current Outpatient Medications  Medication Sig Dispense Refill  . diphenhydrAMINE (BENADRYL) 25 MG tablet Take 25 mg by mouth every morning.    . feeding supplement, ENSURE ENLIVE, (ENSURE ENLIVE) LIQD Take 237 mLs by mouth 3 (three) times daily between meals. 237 mL 12  . lidocaine-prilocaine (EMLA) cream Apply to affected area once 30 g 3  . naproxen sodium (ALEVE) 220 MG tablet Take 220 mg by mouth daily.     Marland Kitchen nystatin (MYCOSTATIN) 100000 UNIT/ML suspension Take 5 mLs (500,000 Units total) by mouth 4 (four) times daily. 60 mL 0  . ondansetron (ZOFRAN) 8 MG tablet Take 1 tablet (8 mg total) by mouth 2 (two) times daily as needed for refractory nausea / vomiting.  30 tablet 2  . prochlorperazine (COMPAZINE) 10 MG tablet Take 1 tablet (10 mg total) by mouth every 6 (six) hours as needed (Nausea or vomiting). 60 tablet 2  . thiamine 100 MG tablet Take 1 tablet (100 mg total) by mouth daily. 30 tablet 0   No current facility-administered medications for this visit.     OBJECTIVE: There were no vitals filed for this visit.   There is no height or weight on file to calculate BMI.     ECOG FS:0 - Asymptomatic  General: Well-developed, well-nourished, no acute distress. Eyes: Pink conjunctiva, anicteric sclera. Lungs: Clear to auscultation bilaterally. Heart: Regular rate and rhythm. No rubs, murmurs, or gallops. Abdomen: Soft, nontender, nondistended. No organomegaly noted, normoactive bowel sounds. Musculoskeletal: No edema, cyanosis, or clubbing. Neuro: Alert, answering all questions appropriately. Cranial nerves grossly intact. Skin: No rashes or petechiae noted. Psych: Normal affect.   LAB RESULTS:  Lab Results  Component Value Date   NA 132 (L) 12/19/2017   K 3.8 12/19/2017   CL 99 (L) 12/19/2017   CO2 24 12/19/2017   GLUCOSE 128 (H) 12/19/2017   BUN 10 12/19/2017   CREATININE 0.68 12/19/2017   CALCIUM 9.6 12/19/2017   PROT 8.5 (H) 12/19/2017   ALBUMIN 3.4 (L) 12/19/2017   AST 27 12/19/2017   ALT 14 (L) 12/19/2017   ALKPHOS 70 12/19/2017   BILITOT 0.4 12/19/2017   GFRNONAA >60 12/19/2017   GFRAA >60 12/19/2017    Lab Results  Component Value Date   WBC 2.9 (L) 12/19/2017   NEUTROABS 1.6 12/19/2017   HGB 13.3 12/19/2017   HCT 38.8 (L) 12/19/2017   MCV 80.9 12/19/2017   PLT 273 12/19/2017     STUDIES: Mr [name] RC Contrast  Result Date: 12/01/2017 CLINICAL DATA:  Malignant neoplasm of lung.  Staging. EXAM: MRI HEAD WITHOUT AND WITH CONTRAST TECHNIQUE: Multiplanar, multiecho pulse sequences of the brain and surrounding structures were obtained without and with intravenous contrast. CONTRAST:  53m MULTIHANCE GADOBENATE DIMEGLUMINE 529 MG/ML IV SOLN COMPARISON:  PET scan 11/16/2017. FINDINGS: Brain: No evidence for acute infarction, hemorrhage, mass lesion, hydrocephalus, or extra-axial fluid. Generalized atrophy. Mild subcortical and periventricular T2 and FLAIR hyperintensities, likely chronic microvascular ischemic change. Periventricular white matter signal abnormality on the LEFT, demonstrates T2 shine through, but does not demonstrate  restriction. Post infusion, no abnormal enhancement of the brain or meninges. Vascular: Flow voids are maintained throughout the carotid, basilar, and vertebral arteries. There are no areas of chronic hemorrhage. Skull and upper cervical spine: Normal marrow signal. No tonsillar herniation. Mild spondylosis C4-C5. Sinuses/Orbits: Paranasal sinuses demonstrate mild mucosal thickening in the ethmoids. Some layering fluid in the larger RIGHT division sphenoid as well as the RIGHT maxillary sinus. Other: BILATERAL layering mastoid effusions. No nasopharyngeal process is evident. IMPRESSION: Atrophy with mild small vessel disease. No acute intracranial findings. No visible intracranial metastatic disease. No osseous lesions are observed. Chronic and acute sinus disease, with layering fluid in the RIGHT maxillary and RIGHT sphenoid regions. Layering mastoid effusions, uncertain significance. Electronically Signed   By: JStaci RighterM.D.   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  PLAN:    1. 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  MRI of the brain reported as negative.  Final pathology results discussed at cancer conference as  well as with pathologist.  Patient will benefit from concurrent chemotherapy with weekly carboplatin and Taxol along with daily XRT.  At the conclusion of his XRT, he will also receive maintenance Infinzi every 2 weeks for up to 12 months.  Proceed with cycle 2 of weekly carboplatinum and Taxol today.  Continue daily XRT completing on January 03, 2018.  Return to clinic in 1 week for further evaluation and consideration of cycle 3. 2.  Renal mass: Highly suspicious for renal cell carcinoma.  Case discussed with urology.  Will continue to monitor and consider possible nephrectomy at the conclusion of his  treatments for his squamous cell carcinoma of lung. 3.  Hyponatremia: Mild, monitor. 4.  Nausea: Continue Zofran as needed.  I spent a total of 30 minutes face-to-face with the patient of which greater than 50% of the visit was spent in counseling and coordination of care as summarized above.   Patient expressed understanding and was in agreement with this plan. He also understands that He can call clinic at any time with any questions, concerns, or complaints.   Cancer Staging Squamous cell lung cancer, right ([place]) Staging form: Lung, AJCC 8th Edition - Clinical: Stage IIIC (cT4, cN3, cM0) - Signed by Lloyd Huger, MD on 11/23/2017   Lloyd Huger, MD   12/25/2017 11:27 PM

## 2017-12-26 ENCOUNTER — Ambulatory Visit: Payer: Medicare Other

## 2017-12-26 ENCOUNTER — Inpatient Hospital Stay: Payer: Medicare Other

## 2017-12-26 ENCOUNTER — Inpatient Hospital Stay: Payer: Medicare Other | Admitting: Oncology

## 2017-12-27 ENCOUNTER — Other Ambulatory Visit: Payer: Self-pay | Admitting: Oncology

## 2017-12-27 ENCOUNTER — Ambulatory Visit
Admission: RE | Admit: 2017-12-27 | Discharge: 2017-12-27 | Disposition: A | Discharge status: Home / Self Care | Payer: Medicare Other | Source: Ambulatory Visit | Attending: Radiation Oncology | Admitting: Radiation Oncology

## 2017-12-27 DIAGNOSIS — C3411 Malignant neoplasm of upper lobe, right bronchus or lung: Secondary | ICD-10-CM | POA: Diagnosis not present

## 2017-12-27 DIAGNOSIS — Z510 Encounter for antineoplastic radiation therapy: Secondary | ICD-10-CM | POA: Insufficient documentation

## 2017-12-27 NOTE — Progress Notes (Signed)
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  INTERVAL HISTORY: Patient returns to clinic today for further evaluation and consideration of cycle 4 of his weekly carboplatinum and Taxol.  He missed treatment last week secondary to "not feeling well".  He currently feels well and is back to his baseline.  He continues to tolerate his daily XRT.  He has no neurologic complaints.  He denies any recent fevers.  He has a decreased appetite, but his weight has remained essentially stable.  He denies any chest pain, shortness of breath, cough, or hemoptysis.  He admits to occasional nausea, but denies any vomiting, constipation, or diarrhea.  He has no urinary complaints.  Patient offers no further specific complaints today.  REVIEW OF SYSTEMS:   Review of Systems  Constitutional: Negative.  Negative for fever, malaise/fatigue and weight loss.  Respiratory: Negative.  Negative for cough, hemoptysis and shortness of breath.   Cardiovascular: Negative.  Negative for chest pain and leg swelling.  Gastrointestinal: Positive for nausea. Negative for abdominal pain and constipation.  Genitourinary: Negative.  Negative for dysuria.  Musculoskeletal: Negative.  Negative for back pain and joint pain.  Skin: Negative.  Negative for rash.  Neurological: Negative.  Negative for sensory change, focal weakness, weakness and headaches.  Psychiatric/Behavioral: Negative.  The patient is not nervous/anxious.     As per HPI. Otherwise, a complete review of systems is negative.  PAST MEDICAL HISTORY: Past Medical History:  Diagnosis Date  . Anxiety   . Depression     PAST SURGICAL HISTORY: Past Surgical  History:  Procedure Laterality Date  . FLEXIBLE BRONCHOSCOPY N/A 11/11/2017   Procedure: FLEXIBLE BRONCHOSCOPY;  Surgeon: Allyne Gee, MD;  Location: [facility];  Service: Pulmonary;  Laterality: N/A;  . none    . PORTA CATH INSERTION N/A 11/29/2017   Procedure: PORTA CATH INSERTION;  Surgeon: Katha Cabal, MD;  Location: [place] [facility];  Service: Cardiovascular;  Laterality: N/A;    FAMILY HISTORY: Family History  Problem Relation Age of Onset  . Diabetes Mellitus II Mother     ADVANCED DIRECTIVES (Y/N):  N  HEALTH MAINTENANCE: Social History   Tobacco Use  . Smoking status: Current Every Day Smoker    Packs/day: 1.00    Types: Cigarettes  . Smokeless tobacco: Never Used  Substance Use Topics  . Alcohol use: Yes    Comment: alot   . Drug use: Yes     Colonoscopy:  PAP:  Bone density:  Lipid panel:  No Known Allergies  Current Outpatient Medications  Medication Sig Dispense Refill  . diphenhydrAMINE (BENADRYL) 25 MG tablet Take 25 mg by mouth every morning.    . feeding supplement, ENSURE ENLIVE, (ENSURE ENLIVE) LIQD Take 237 mLs by mouth 3 (three) times daily between meals. 237 mL 12  . lidocaine-prilocaine (EMLA) cream Apply to affected area once 30 g 3  . megestrol (MEGACE) 40 MG tablet Take 1 tablet (40 mg total) by mouth daily. 30 tablet 1  . naproxen sodium (ALEVE) 220 MG tablet Take 220 mg by mouth daily.     Marland Kitchen nystatin (MYCOSTATIN) 100000 UNIT/ML suspension Take 5 mLs (500,000 Units total) by mouth 4 (four) times daily. 60 mL  0  . ondansetron (ZOFRAN) 8 MG tablet Take 1 tablet (8 mg total) by mouth 2 (two) times daily as needed for refractory nausea / vomiting. 30 tablet 2  . prochlorperazine (COMPAZINE) 10 MG tablet Take 1 tablet (10 mg total) by mouth every 6 (six) hours as needed (Nausea or vomiting). 60 tablet 2  . thiamine 100 MG tablet Take 1 tablet (100 mg total) by mouth daily. 30 tablet 0   No current facility-administered medications for  this visit.     OBJECTIVE: Vitals:   12/28/17 1225  BP: 100/71  Pulse: 81  Resp: 20  Temp: (!) 96.7 F (35.9 C)     Body mass index is 16.85 kg/m.    ECOG FS:0 - Asymptomatic  General: Well-developed, well-nourished, no acute distress. Eyes: Pink conjunctiva, anicteric sclera. Lungs: Clear to auscultation bilaterally. Heart: Regular rate and rhythm. No rubs, murmurs, or gallops. Abdomen: Soft, nontender, nondistended. No organomegaly noted, normoactive bowel sounds. Musculoskeletal: No edema, cyanosis, or clubbing. Neuro: Alert, answering all questions appropriately. Cranial nerves grossly intact. Skin: No rashes or petechiae noted. Psych: Normal affect. Lymphatics: No cervical, calvicular, axillary or inguinal LAD.  LAB RESULTS:  Lab Results  Component Value Date   NA 131 (L) 12/28/2017   K 4.1 12/28/2017   CL 97 (L) 12/28/2017   CO2 25 12/28/2017   GLUCOSE 104 (H) 12/28/2017   BUN 13 12/28/2017   CREATININE 0.76 12/28/2017   CALCIUM 9.5 12/28/2017   PROT 8.4 (H) 12/28/2017   ALBUMIN 3.4 (L) 12/28/2017   AST 32 12/28/2017   ALT 19 12/28/2017   ALKPHOS 70 12/28/2017   BILITOT 0.3 12/28/2017   GFRNONAA >60 12/28/2017   GFRAA >60 12/28/2017    Lab Results  Component Value Date   WBC 3.3 (L) 12/28/2017   NEUTROABS 1.9 12/28/2017   HGB 12.8 (L) 12/28/2017   HCT 38.7 (L) 12/28/2017   MCV 79.7 (L) 12/28/2017   PLT 243 12/28/2017     STUDIES: Mr [name] QJ Contrast  Result Date: 12/01/2017 CLINICAL DATA:  Malignant neoplasm of lung.  Staging. EXAM: MRI HEAD WITHOUT AND WITH CONTRAST TECHNIQUE: Multiplanar, multiecho pulse sequences of the brain and surrounding structures were obtained without and with intravenous contrast. CONTRAST:  16m MULTIHANCE GADOBENATE DIMEGLUMINE 529 MG/ML IV SOLN COMPARISON:  PET scan 11/16/2017. FINDINGS: Brain: No evidence for acute infarction, hemorrhage, mass lesion, hydrocephalus, or extra-axial fluid. Generalized atrophy. Mild  subcortical and periventricular T2 and FLAIR hyperintensities, likely chronic microvascular ischemic change. Periventricular white matter signal abnormality on the LEFT, demonstrates T2 shine through, but does not demonstrate restriction. Post infusion, no abnormal enhancement of the brain or meninges. Vascular: Flow voids are maintained throughout the carotid, basilar, and vertebral arteries. There are no areas of chronic hemorrhage. Skull and upper cervical spine: Normal marrow signal. No tonsillar herniation. Mild spondylosis C4-C5. Sinuses/Orbits: Paranasal sinuses demonstrate mild mucosal thickening in the ethmoids. Some layering fluid in the larger RIGHT division sphenoid as well as the RIGHT maxillary sinus. Other: BILATERAL layering mastoid effusions. No nasopharyngeal process is evident. IMPRESSION: Atrophy with mild small vessel disease. No acute intracranial findings. No visible intracranial metastatic disease. No osseous lesions are observed. Chronic and acute sinus disease, with layering fluid in the RIGHT maxillary and RIGHT sphenoid regions. Layering mastoid effusions, uncertain significance. Electronically Signed   By: JStaci RighterM.D.   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  PLAN:  1. 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  MRI of the brain from December 01, 2017 reviewed independently with no evidence of metastatic disease.  Patient will benefit from concurrent chemotherapy with weekly carboplatin and Taxol along with daily XRT.  At the conclusion of his XRT, he will also receive maintenance Infinzi every 2 weeks for up to 12 months.  Proceed with cycle 4 of weekly carboplatinum and Taxol today.  Proceed with cycle 2 of weekly carboplatinum and Taxol today.  Continue daily XRT completing on January 05, 2018.  Return to clinic in 1 week for further evaluation and consideration of cycle 5. 2.  Renal mass: Highly suspicious for renal cell carcinoma.  Case discussed with urology.  Will continue to monitor and consider possible nephrectomy at the conclusion of his treatments for his squamous cell carcinoma of lung. 3.  Hyponatremia: Patient's sodium level remains decreased, but stable.  Monitor. 4.  Nausea: Continue Zofran as needed. 5.  Leukopenia: Mild.  Proceed with treatment as above.   Patient expressed understanding and was in agreement with this plan. He also understands that He can call clinic at any time with any questions, concerns, or complaints.   Cancer Staging Squamous cell lung cancer, right ([place]) Staging form: Lung, AJCC 8th Edition - Clinical: Stage IIIC (cT4, cN3, cM0) - Signed by Lloyd Huger, MD on 11/23/2017   Lloyd Huger, MD   12/30/2017 7:43 AM

## 2017-12-28 ENCOUNTER — Ambulatory Visit: Payer: Medicare Other | Admitting: Oncology

## 2017-12-28 ENCOUNTER — Other Ambulatory Visit: Payer: Medicare Other

## 2017-12-28 ENCOUNTER — Inpatient Hospital Stay: Payer: Medicare Other

## 2017-12-28 ENCOUNTER — Ambulatory Visit: Payer: Medicare Other

## 2017-12-28 ENCOUNTER — Encounter: Payer: Self-pay | Admitting: Oncology

## 2017-12-28 ENCOUNTER — Ambulatory Visit
Admission: RE | Admit: 2017-12-28 | Discharge: 2017-12-28 | Disposition: A | Discharge status: Home / Self Care | Payer: Medicare Other | Source: Ambulatory Visit | Attending: Radiation Oncology | Admitting: Radiation Oncology

## 2017-12-28 ENCOUNTER — Inpatient Hospital Stay: Payer: Medicare Other | Attending: Oncology

## 2017-12-28 ENCOUNTER — Inpatient Hospital Stay (HOSPITAL_BASED_OUTPATIENT_CLINIC_OR_DEPARTMENT_OTHER): Payer: Medicare Other | Admitting: Oncology

## 2017-12-28 VITALS — BP 100/71 | HR 81 | Temp 96.7°F | Resp 20 | Wt 129.5 lb

## 2017-12-28 DIAGNOSIS — D72819 Decreased white blood cell count, unspecified: Secondary | ICD-10-CM

## 2017-12-28 DIAGNOSIS — R110 Nausea: Secondary | ICD-10-CM | POA: Insufficient documentation

## 2017-12-28 DIAGNOSIS — F17210 Nicotine dependence, cigarettes, uncomplicated: Secondary | ICD-10-CM | POA: Diagnosis not present

## 2017-12-28 DIAGNOSIS — C3491 Malignant neoplasm of unspecified part of right bronchus or lung: Secondary | ICD-10-CM

## 2017-12-28 DIAGNOSIS — C3411 Malignant neoplasm of upper lobe, right bronchus or lung: Secondary | ICD-10-CM | POA: Insufficient documentation

## 2017-12-28 DIAGNOSIS — Z5111 Encounter for antineoplastic chemotherapy: Secondary | ICD-10-CM | POA: Insufficient documentation

## 2017-12-28 DIAGNOSIS — K208 Other esophagitis: Secondary | ICD-10-CM | POA: Insufficient documentation

## 2017-12-28 DIAGNOSIS — Z79899 Other long term (current) drug therapy: Secondary | ICD-10-CM | POA: Insufficient documentation

## 2017-12-28 DIAGNOSIS — N289 Disorder of kidney and ureter, unspecified: Secondary | ICD-10-CM | POA: Insufficient documentation

## 2017-12-28 DIAGNOSIS — F418 Other specified anxiety disorders: Secondary | ICD-10-CM | POA: Insufficient documentation

## 2017-12-28 DIAGNOSIS — Z510 Encounter for antineoplastic radiation therapy: Secondary | ICD-10-CM | POA: Diagnosis not present

## 2017-12-28 DIAGNOSIS — Z923 Personal history of irradiation: Secondary | ICD-10-CM

## 2017-12-28 DIAGNOSIS — E871 Hypo-osmolality and hyponatremia: Secondary | ICD-10-CM | POA: Insufficient documentation

## 2017-12-28 DIAGNOSIS — R531 Weakness: Secondary | ICD-10-CM | POA: Insufficient documentation

## 2017-12-28 LAB — COMPREHENSIVE METABOLIC PANEL
ALT: 19 U/L (ref 0–44)
AST: 32 U/L (ref 15–41)
Albumin: 3.4 g/dL — ABNORMAL LOW (ref 3.5–5.0)
Alkaline Phosphatase: 70 U/L (ref 38–126)
Anion gap: 9 (ref 5–15)
BUN: 13 mg/dL (ref 6–20)
CHLORIDE: 97 mmol/L — AB (ref 98–111)
CO2: 25 mmol/L (ref 22–32)
CREATININE: 0.76 mg/dL (ref 0.61–1.24)
Calcium: 9.5 mg/dL (ref 8.9–10.3)
GFR calc non Af Amer: >60 mL/min (ref >60)
Glucose, Bld: 104 mg/dL — ABNORMAL HIGH (ref 70–99)
Potassium: 4.1 mmol/L (ref 3.5–5.1)
SODIUM: 131 mmol/L — AB (ref 135–145)
TOTAL PROTEIN: 8.4 g/dL — AB (ref 6.5–8.1)
Total Bilirubin: 0.3 mg/dL (ref 0.3–1.2)

## 2017-12-28 LAB — CBC WITH DIFFERENTIAL/PLATELET
BASOS ABS: 0 10*3/uL (ref 0–0.1)
BASOS PCT: 1 %
Eosinophils Absolute: 0.1 10*3/uL (ref 0–0.7)
Eosinophils Relative: 2 %
HEMATOCRIT: 38.7 % — AB (ref 40.0–52.0)
HEMOGLOBIN: 12.8 g/dL — AB (ref 13.0–18.0)
Lymphocytes Relative: 18 %
Lymphs Abs: 0.6 10*3/uL — ABNORMAL LOW (ref 1.0–3.6)
MCH: 26.4 pg (ref 26.0–34.0)
MCHC: 33.1 g/dL (ref 32.0–36.0)
MCV: 79.7 fL — ABNORMAL LOW (ref 80.0–100.0)
MONOS PCT: 22 %
Monocytes Absolute: 0.7 10*3/uL (ref 0.2–1.0)
NEUTROS ABS: 1.9 10*3/uL (ref 1.4–6.5)
NEUTROS PCT: 57 %
Platelets: 243 10*3/uL (ref 150–440)
RBC: 4.85 MIL/uL (ref 4.40–5.90)
RDW: 14.6 % — ABNORMAL HIGH (ref 11.5–14.5)
WBC: 3.3 10*3/uL — AB (ref 3.8–10.6)

## 2017-12-28 MED ORDER — DIPHENHYDRAMINE HCL 50 MG/ML IJ SOLN
25.0000 mg | Freq: Once | INTRAMUSCULAR | Status: AC
  Administered 2017-12-28: 25 mg via INTRAVENOUS
  Filled 2017-12-28: qty 1

## 2017-12-28 MED ORDER — PROCHLORPERAZINE MALEATE 10 MG PO TABS: 10.0000 mg | ORAL_TABLET | Freq: Four times a day (QID) | ORAL | 2 refills | Status: DC | PRN

## 2017-12-28 MED ORDER — SODIUM CHLORIDE 0.9 % IV SOLN
210.0000 mg | Freq: Once | INTRAVENOUS | Status: AC
  Administered 2017-12-28: 210 mg via INTRAVENOUS
  Filled 2017-12-28: qty 21

## 2017-12-28 MED ORDER — SODIUM CHLORIDE 0.9 % IV SOLN
Freq: Once | INTRAVENOUS | Status: AC
Start: 2017-12-28 — End: 2017-12-28
  Administered 2017-12-28: 13:00:00 via INTRAVENOUS
  Filled 2017-12-28: qty 1000

## 2017-12-28 MED ORDER — ONDANSETRON HCL 8 MG PO TABS: 8.0000 mg | ORAL_TABLET | Freq: Two times a day (BID) | ORAL | 2 refills | Status: DC | PRN

## 2017-12-28 MED ORDER — HEPARIN SOD (PORK) LOCK FLUSH 100 UNIT/ML IV SOLN
INTRAVENOUS | Status: AC
  Filled 2017-12-28: qty 5

## 2017-12-28 MED ORDER — MEGESTROL ACETATE 40 MG PO TABS: 40.0000 mg | ORAL_TABLET | Freq: Every day | ORAL | 1 refills | Status: AC

## 2017-12-28 MED ORDER — SODIUM CHLORIDE 0.9% FLUSH
10.0000 mL | Freq: Once | INTRAVENOUS | Status: AC
  Administered 2017-12-28: 10 mL via INTRAVENOUS
  Filled 2017-12-28: qty 10

## 2017-12-28 MED ORDER — FAMOTIDINE IN NACL 20-0.9 MG/50ML-% IV SOLN
20.0000 mg | Freq: Once | INTRAVENOUS | Status: AC
  Administered 2017-12-28: 20 mg via INTRAVENOUS
  Filled 2017-12-28: qty 50

## 2017-12-28 MED ORDER — SODIUM CHLORIDE 0.9 % IV SOLN
45.0000 mg/m2 | Freq: Once | INTRAVENOUS | Status: AC
  Administered 2017-12-28: 78 mg via INTRAVENOUS
  Filled 2017-12-28: qty 13

## 2017-12-28 MED ORDER — DEXAMETHASONE SODIUM PHOSPHATE 10 MG/ML IJ SOLN
10.0000 mg | Freq: Once | INTRAMUSCULAR | Status: AC
  Administered 2017-12-28: 10 mg via INTRAVENOUS
  Filled 2017-12-28: qty 1

## 2017-12-28 MED ORDER — HEPARIN SOD (PORK) LOCK FLUSH 100 UNIT/ML IV SOLN
500.0000 [IU] | Freq: Once | INTRAVENOUS | Status: AC
  Administered 2017-12-28: 500 [IU] via INTRAVENOUS

## 2017-12-28 MED ORDER — PALONOSETRON HCL INJECTION 0.25 MG/5ML
0.2500 mg | Freq: Once | INTRAVENOUS | Status: AC
  Administered 2017-12-28: 0.25 mg via INTRAVENOUS
  Filled 2017-12-28: qty 5

## 2017-12-28 NOTE — Progress Notes (Signed)
Patient denies any concerns today.  

## 2017-12-30 ENCOUNTER — Ambulatory Visit
Admission: RE | Admit: 2017-12-30 | Discharge: 2017-12-30 | Disposition: A | Discharge status: Home / Self Care | Payer: Medicare Other | Source: Ambulatory Visit | Attending: Radiation Oncology | Admitting: Radiation Oncology

## 2017-12-30 DIAGNOSIS — Z510 Encounter for antineoplastic radiation therapy: Secondary | ICD-10-CM | POA: Diagnosis not present

## 2018-01-01 NOTE — Progress Notes (Signed)
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  INTERVAL HISTORY: Patient returns to clinic today for further evaluation and consideration of cycle 5 of weekly carboplatin and Taxol.  He currently feels well and is asymptomatic. He continues to tolerate his daily XRT.  He has no neurologic complaints.  He denies any recent fevers.  He has a decreased appetite, but his weight has remained essentially stable.  He denies any chest pain, shortness of breath, cough, or hemoptysis.  He admits to occasional nausea, but denies any vomiting, constipation, or diarrhea.  He has no urinary complaints.  Patient offers no specific complaints today.  REVIEW OF SYSTEMS:   Review of Systems  Constitutional: Negative.  Negative for fever, malaise/fatigue and weight loss.  Respiratory: Negative.  Negative for cough, hemoptysis and shortness of breath.   Cardiovascular: Negative.  Negative for chest pain and leg swelling.  Gastrointestinal: Positive for nausea. Negative for abdominal pain and constipation.  Genitourinary: Negative.  Negative for dysuria.  Musculoskeletal: Negative.  Negative for back pain and joint pain.  Skin: Negative.  Negative for rash.  Neurological: Negative.  Negative for sensory change, focal weakness, weakness and headaches.  Psychiatric/Behavioral: Negative.  The patient is not nervous/anxious.     As per HPI. Otherwise, a complete review of systems is negative.  PAST MEDICAL HISTORY: Past Medical History:  Diagnosis Date  . Anxiety   . Depression     PAST SURGICAL HISTORY: Past Surgical History:  Procedure Laterality Date  . FLEXIBLE BRONCHOSCOPY N/A 11/11/2017   Procedure:  FLEXIBLE BRONCHOSCOPY;  Surgeon: Allyne Gee, MD;  Location: [facility];  Service: Pulmonary;  Laterality: N/A;  . none    . PORTA CATH INSERTION N/A 11/29/2017   Procedure: PORTA CATH INSERTION;  Surgeon: Katha Cabal, MD;  Location: [place] [facility];  Service: Cardiovascular;  Laterality: N/A;    FAMILY HISTORY: Family History  Problem Relation Age of Onset  . Diabetes Mellitus II Mother     ADVANCED DIRECTIVES (Y/N):  N  HEALTH MAINTENANCE: Social History   Tobacco Use  . Smoking status: Current Every Day Smoker    Packs/day: 1.00    Types: Cigarettes  . Smokeless tobacco: Never Used  Substance Use Topics  . Alcohol use: Yes    Comment: alot   . Drug use: Yes     Colonoscopy:  PAP:  Bone density:  Lipid panel:  No Known Allergies  Current Outpatient Medications  Medication Sig Dispense Refill  . feeding supplement, ENSURE ENLIVE, (ENSURE ENLIVE) LIQD Take 237 mLs by mouth 3 (three) times daily between meals. 237 mL 12  . lidocaine-prilocaine (EMLA) cream Apply to affected area once 30 g 3  . megestrol (MEGACE) 40 MG tablet Take 1 tablet (40 mg total) by mouth daily. 30 tablet 1  . naproxen sodium (ALEVE) 220 MG tablet Take 220 mg by mouth daily.     . ondansetron (ZOFRAN) 8 MG tablet Take 1 tablet (8 mg total) by mouth 2 (two) times daily as needed for refractory nausea / vomiting. 30 tablet 2  . prochlorperazine (COMPAZINE) 10 MG tablet Take 1 tablet (10 mg total) by mouth every 6 (six) hours as needed (Nausea or vomiting). [place]  tablet 2  . diphenhydrAMINE (BENADRYL) 25 MG tablet Take 25 mg by mouth every morning.    . nystatin (MYCOSTATIN) 100000 UNIT/ML suspension Take 5 mLs (500,000 Units total) by mouth 4 (four) times daily. (Patient not taking: Reported on 01/05/2018) 60 mL 0  . thiamine 100 MG tablet Take 1 tablet (100 mg total) by mouth daily. (Patient not taking: Reported on 01/05/2018) 30 tablet 0   No current facility-administered medications for this  visit.     OBJECTIVE: Vitals:   01/05/18 0905  BP: 106/73  Pulse: 88  Resp: 18  Temp: (!) 96 F (35.6 C)     Body mass index is 16.55 kg/m.    ECOG FS:0 - Asymptomatic  General: Well-developed, well-nourished, no acute distress. Eyes: Pink conjunctiva, anicteric sclera. Lungs: Clear to auscultation bilaterally. Heart: Regular rate and rhythm. No rubs, murmurs, or gallops. Abdomen: Soft, nontender, nondistended. No organomegaly noted, normoactive bowel sounds. Musculoskeletal: No edema, cyanosis, or clubbing. Neuro: Alert, answering all questions appropriately. Cranial nerves grossly intact. Skin: No rashes or petechiae noted. Psych: Normal affect.  LAB RESULTS:  Lab Results  Component Value Date   NA 136 01/05/2018   K 3.6 01/05/2018   CL 108 01/05/2018   CO2 18 (L) 01/05/2018   GLUCOSE 120 (H) 01/05/2018   BUN 11 01/05/2018   CREATININE 0.86 01/05/2018   CALCIUM 9.4 01/05/2018   PROT 7.9 01/05/2018   ALBUMIN 3.4 (L) 01/05/2018   AST 30 01/05/2018   ALT 22 01/05/2018   ALKPHOS 73 01/05/2018   BILITOT 0.4 01/05/2018   GFRNONAA >60 01/05/2018   GFRAA >60 01/05/2018    Lab Results  Component Value Date   WBC 2.4 (L) 01/05/2018   NEUTROABS 1.5 01/05/2018   HGB 12.7 (L) 01/05/2018   HCT 37.8 (L) 01/05/2018   MCV 80.0 01/05/2018   PLT 191 01/05/2018     STUDIES: No results found.  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  PLAN:    1. 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  MRI of the brain from December 01, 2017 reviewed independently with no evidence of metastatic disease.  Proceed with cycle 5 of weekly carboplatinum and Taxol today.  Patient will complete XRT today.  Now that he is completed his treatments, we will proceed with maintenance Infinzi every 2 weeks for up to 12 months.  Return to  clinic in 4 weeks for further evaluation and consideration of cycle 1.  Plan to reimage in August or September 2019. 2.  Renal mass: Highly suspicious for renal cell carcinoma.  Case discussed with urology.  Will continue to monitor and consider possible nephrectomy at the conclusion of his treatments for his squamous cell carcinoma of lung.  Reimaging as above. 3.  Hyponatremia: Resolved. 4.  Nausea: Mild and chronic.  Continue Zofran as needed. 5.  Leukopenia: White blood cell count is slightly decreased at 2.4 today.  Proceed with treatment as above.   Patient expressed understanding and was in agreement with this plan. He also understands that He can call clinic at any time with any questions, concerns, or complaints.   Cancer Staging Squamous cell lung cancer, right ([place]) Staging form: Lung, AJCC 8th Edition - Clinical: Stage IIIC (cT4, cN3, cM0) - Signed by Lloyd Huger, MD on 11/23/2017   Lloyd Huger, MD   01/08/2018 8:35  AM     

## 2018-01-02 ENCOUNTER — Ambulatory Visit
Admission: RE | Admit: 2018-01-02 | Discharge: 2018-01-02 | Disposition: A | Discharge status: Home / Self Care | Payer: Medicare Other | Source: Ambulatory Visit | Attending: Radiation Oncology | Admitting: Radiation Oncology

## 2018-01-02 DIAGNOSIS — Z510 Encounter for antineoplastic radiation therapy: Secondary | ICD-10-CM | POA: Diagnosis not present

## 2018-01-03 ENCOUNTER — Ambulatory Visit: Payer: Medicare Other

## 2018-01-03 ENCOUNTER — Ambulatory Visit
Admission: RE | Admit: 2018-01-03 | Discharge: 2018-01-03 | Disposition: A | Discharge status: Home / Self Care | Payer: Medicare Other | Source: Ambulatory Visit | Attending: Radiation Oncology | Admitting: Radiation Oncology

## 2018-01-03 DIAGNOSIS — Z510 Encounter for antineoplastic radiation therapy: Secondary | ICD-10-CM | POA: Diagnosis not present

## 2018-01-04 ENCOUNTER — Ambulatory Visit: Payer: Medicare Other

## 2018-01-04 ENCOUNTER — Ambulatory Visit
Admission: RE | Admit: 2018-01-04 | Discharge: 2018-01-04 | Disposition: A | Discharge status: Home / Self Care | Payer: Medicare Other | Source: Ambulatory Visit | Attending: Radiation Oncology | Admitting: Radiation Oncology

## 2018-01-04 DIAGNOSIS — Z510 Encounter for antineoplastic radiation therapy: Secondary | ICD-10-CM | POA: Diagnosis not present

## 2018-01-05 ENCOUNTER — Inpatient Hospital Stay: Payer: Medicare Other

## 2018-01-05 ENCOUNTER — Inpatient Hospital Stay (HOSPITAL_BASED_OUTPATIENT_CLINIC_OR_DEPARTMENT_OTHER): Payer: Medicare Other | Admitting: Oncology

## 2018-01-05 ENCOUNTER — Other Ambulatory Visit: Payer: Self-pay

## 2018-01-05 ENCOUNTER — Encounter: Payer: Self-pay | Admitting: Oncology

## 2018-01-05 ENCOUNTER — Ambulatory Visit
Admission: RE | Admit: 2018-01-05 | Discharge: 2018-01-05 | Disposition: A | Discharge status: Home / Self Care | Payer: Medicare Other | Source: Ambulatory Visit | Attending: Radiation Oncology | Admitting: Radiation Oncology

## 2018-01-05 VITALS — BP 106/73 | HR 88 | Temp 96.0°F | Resp 18 | Wt 127.2 lb

## 2018-01-05 DIAGNOSIS — R110 Nausea: Secondary | ICD-10-CM | POA: Diagnosis not present

## 2018-01-05 DIAGNOSIS — N289 Disorder of kidney and ureter, unspecified: Secondary | ICD-10-CM

## 2018-01-05 DIAGNOSIS — C3491 Malignant neoplasm of unspecified part of right bronchus or lung: Secondary | ICD-10-CM

## 2018-01-05 DIAGNOSIS — D72819 Decreased white blood cell count, unspecified: Secondary | ICD-10-CM | POA: Diagnosis not present

## 2018-01-05 DIAGNOSIS — Z79899 Other long term (current) drug therapy: Secondary | ICD-10-CM | POA: Diagnosis not present

## 2018-01-05 DIAGNOSIS — F418 Other specified anxiety disorders: Secondary | ICD-10-CM

## 2018-01-05 DIAGNOSIS — Z5111 Encounter for antineoplastic chemotherapy: Secondary | ICD-10-CM

## 2018-01-05 DIAGNOSIS — C3411 Malignant neoplasm of upper lobe, right bronchus or lung: Secondary | ICD-10-CM | POA: Diagnosis not present

## 2018-01-05 DIAGNOSIS — F17210 Nicotine dependence, cigarettes, uncomplicated: Secondary | ICD-10-CM | POA: Diagnosis not present

## 2018-01-05 DIAGNOSIS — Z923 Personal history of irradiation: Secondary | ICD-10-CM | POA: Diagnosis not present

## 2018-01-05 DIAGNOSIS — Z510 Encounter for antineoplastic radiation therapy: Secondary | ICD-10-CM | POA: Diagnosis not present

## 2018-01-05 DIAGNOSIS — E871 Hypo-osmolality and hyponatremia: Secondary | ICD-10-CM

## 2018-01-05 LAB — CBC WITH DIFFERENTIAL/PLATELET
BASOS ABS: 0 10*3/uL (ref 0–0.1)
Basophils Relative: 1 %
Eosinophils Absolute: 0 10*3/uL (ref 0–0.7)
Eosinophils Relative: 2 %
HCT: 37.8 % — ABNORMAL LOW (ref 40.0–52.0)
Hemoglobin: 12.7 g/dL — ABNORMAL LOW (ref 13.0–18.0)
LYMPHS ABS: 0.5 10*3/uL — AB (ref 1.0–3.6)
LYMPHS PCT: 20 %
MCH: 26.9 pg (ref 26.0–34.0)
MCHC: 33.6 g/dL (ref 32.0–36.0)
MCV: 80 fL (ref 80.0–100.0)
MONO ABS: 0.4 10*3/uL (ref 0.2–1.0)
Monocytes Relative: 15 %
NEUTROS ABS: 1.5 10*3/uL (ref 1.4–6.5)
Neutrophils Relative %: 62 %
Platelets: 191 10*3/uL (ref 150–440)
RBC: 4.72 MIL/uL (ref 4.40–5.90)
RDW: 16 % — AB (ref 11.5–14.5)
WBC: 2.4 10*3/uL — ABNORMAL LOW (ref 3.8–10.6)

## 2018-01-05 LAB — COMPREHENSIVE METABOLIC PANEL
ALT: 22 U/L (ref 0–44)
ANION GAP: 10 (ref 5–15)
AST: 30 U/L (ref 15–41)
Albumin: 3.4 g/dL — ABNORMAL LOW (ref 3.5–5.0)
Alkaline Phosphatase: 73 U/L (ref 38–126)
BILIRUBIN TOTAL: 0.4 mg/dL (ref 0.3–1.2)
BUN: 11 mg/dL (ref 6–20)
CHLORIDE: 108 mmol/L (ref 98–111)
CO2: 18 mmol/L — AB (ref 22–32)
Calcium: 9.4 mg/dL (ref 8.9–10.3)
Creatinine, Ser: 0.86 mg/dL (ref 0.61–1.24)
GFR calc Af Amer: >60 mL/min (ref >60)
GFR calc non Af Amer: >60 mL/min (ref >60)
GLUCOSE: 120 mg/dL — AB (ref 70–99)
POTASSIUM: 3.6 mmol/L (ref 3.5–5.1)
Sodium: 136 mmol/L (ref 135–145)
TOTAL PROTEIN: 7.9 g/dL (ref 6.5–8.1)

## 2018-01-05 MED ORDER — SODIUM CHLORIDE 0.9% FLUSH
10.0000 mL | INTRAVENOUS | Status: DC | PRN
  Administered 2018-01-05: 10 mL via INTRAVENOUS
  Filled 2018-01-05: qty 10

## 2018-01-05 MED ORDER — PACLITAXEL CHEMO INJECTION 300 MG/50ML
45.0000 mg/m2 | Freq: Once | INTRAVENOUS | Status: AC
  Administered 2018-01-05: 78 mg via INTRAVENOUS
  Filled 2018-01-05: qty 13

## 2018-01-05 MED ORDER — PALONOSETRON HCL INJECTION 0.25 MG/5ML
0.2500 mg | Freq: Once | INTRAVENOUS | Status: AC
  Administered 2018-01-05: 0.25 mg via INTRAVENOUS
  Filled 2018-01-05: qty 5

## 2018-01-05 MED ORDER — HEPARIN SOD (PORK) LOCK FLUSH 100 UNIT/ML IV SOLN: 500.0000 [IU] | Freq: Once | INTRAVENOUS | Status: DC | PRN

## 2018-01-05 MED ORDER — SODIUM CHLORIDE 0.9 % IV SOLN
Freq: Once | INTRAVENOUS | Status: AC
  Administered 2018-01-05: 10:00:00 via INTRAVENOUS
  Filled 2018-01-05: qty 1000

## 2018-01-05 MED ORDER — HEPARIN SOD (PORK) LOCK FLUSH 100 UNIT/ML IV SOLN
500.0000 [IU] | Freq: Once | INTRAVENOUS | Status: AC
  Administered 2018-01-05: 500 [IU] via INTRAVENOUS
  Filled 2018-01-05: qty 5

## 2018-01-05 MED ORDER — DEXAMETHASONE SODIUM PHOSPHATE 10 MG/ML IJ SOLN
10.0000 mg | Freq: Once | INTRAMUSCULAR | Status: AC
  Administered 2018-01-05: 10 mg via INTRAVENOUS
  Filled 2018-01-05: qty 1

## 2018-01-05 MED ORDER — FAMOTIDINE IN NACL 20-0.9 MG/50ML-% IV SOLN
20.0000 mg | Freq: Once | INTRAVENOUS | Status: AC
  Administered 2018-01-05: 20 mg via INTRAVENOUS
  Filled 2018-01-05: qty 50

## 2018-01-05 MED ORDER — SODIUM CHLORIDE 0.9 % IV SOLN
205.2000 mg | Freq: Once | INTRAVENOUS | Status: AC
  Administered 2018-01-05: 210 mg via INTRAVENOUS
  Filled 2018-01-05: qty 21

## 2018-01-05 MED ORDER — DIPHENHYDRAMINE HCL 50 MG/ML IJ SOLN
25.0000 mg | Freq: Once | INTRAMUSCULAR | Status: AC
  Administered 2018-01-05: 25 mg via INTRAVENOUS
  Filled 2018-01-05: qty 1

## 2018-01-05 NOTE — Progress Notes (Signed)
Patient here for follow up. No concerns voiced.  °

## 2018-01-08 NOTE — Progress Notes (Signed)
DISCONTINUE ON PATHWAY REGIMEN - Non-Small Cell Lung     Administer weekly:     Paclitaxel      Carboplatin   **Always confirm dose/schedule in your pharmacy ordering system**  REASON: Continuation Of Treatment PRIOR TREATMENT: ERX540: Carboplatin AUC=2 + Paclitaxel 45 mg/m2 Weekly During Radiation TREATMENT RESPONSE: Partial Response (PR)  START ON PATHWAY REGIMEN - Non-Small Cell Lung     A cycle is every 14 days:     Durvalumab   **Always confirm dose/schedule in your pharmacy ordering system**  Patient Characteristics: Stage III - Unresectable, PS = 0, 1 AJCC T Category: T4 Current Disease Status: No Distant Mets or Local Recurrence AJCC N Category: N3 AJCC M Category: M0 AJCC 8 Stage Grouping: IIIC Performance Status: PS = 0, 1 Intent of Therapy: Curative Intent, Discussed with Patient

## 2018-01-09 ENCOUNTER — Ambulatory Visit: Payer: Medicare Other

## 2018-01-09 ENCOUNTER — Inpatient Hospital Stay (HOSPITAL_BASED_OUTPATIENT_CLINIC_OR_DEPARTMENT_OTHER): Payer: Medicare Other | Admitting: Nurse Practitioner

## 2018-01-09 ENCOUNTER — Encounter: Payer: Self-pay | Admitting: Nurse Practitioner

## 2018-01-09 ENCOUNTER — Inpatient Hospital Stay: Payer: Medicare Other

## 2018-01-09 VITALS — Resp 18

## 2018-01-09 VITALS — BP 103/73 | HR 101 | Temp 98.0°F

## 2018-01-09 DIAGNOSIS — C3491 Malignant neoplasm of unspecified part of right bronchus or lung: Secondary | ICD-10-CM

## 2018-01-09 DIAGNOSIS — C3411 Malignant neoplasm of upper lobe, right bronchus or lung: Secondary | ICD-10-CM | POA: Diagnosis not present

## 2018-01-09 DIAGNOSIS — K208 Other esophagitis without bleeding: Secondary | ICD-10-CM

## 2018-01-09 DIAGNOSIS — E43 Unspecified severe protein-calorie malnutrition: Secondary | ICD-10-CM

## 2018-01-09 DIAGNOSIS — Z9221 Personal history of antineoplastic chemotherapy: Secondary | ICD-10-CM | POA: Diagnosis not present

## 2018-01-09 DIAGNOSIS — F418 Other specified anxiety disorders: Secondary | ICD-10-CM

## 2018-01-09 DIAGNOSIS — Z5111 Encounter for antineoplastic chemotherapy: Secondary | ICD-10-CM | POA: Diagnosis not present

## 2018-01-09 DIAGNOSIS — N2889 Other specified disorders of kidney and ureter: Secondary | ICD-10-CM

## 2018-01-09 DIAGNOSIS — N289 Disorder of kidney and ureter, unspecified: Secondary | ICD-10-CM | POA: Diagnosis not present

## 2018-01-09 DIAGNOSIS — Z923 Personal history of irradiation: Secondary | ICD-10-CM

## 2018-01-09 DIAGNOSIS — Z95828 Presence of other vascular implants and grafts: Secondary | ICD-10-CM

## 2018-01-09 DIAGNOSIS — E860 Dehydration: Secondary | ICD-10-CM

## 2018-01-09 DIAGNOSIS — F17210 Nicotine dependence, cigarettes, uncomplicated: Secondary | ICD-10-CM | POA: Diagnosis not present

## 2018-01-09 DIAGNOSIS — F17200 Nicotine dependence, unspecified, uncomplicated: Secondary | ICD-10-CM | POA: Insufficient documentation

## 2018-01-09 DIAGNOSIS — R531 Weakness: Secondary | ICD-10-CM | POA: Diagnosis not present

## 2018-01-09 LAB — CBC WITH DIFFERENTIAL/PLATELET
BASOS PCT: 1 %
Basophils Absolute: 0 10*3/uL (ref 0–0.1)
EOS ABS: 0 10*3/uL (ref 0–0.7)
EOS PCT: 1 %
HCT: 39.1 % — ABNORMAL LOW (ref 40.0–52.0)
Hemoglobin: 12.9 g/dL — ABNORMAL LOW (ref 13.0–18.0)
Lymphocytes Relative: 16 %
Lymphs Abs: 0.5 10*3/uL — ABNORMAL LOW (ref 1.0–3.6)
MCH: 25.8 pg — ABNORMAL LOW (ref 26.0–34.0)
MCHC: 33 g/dL (ref 32.0–36.0)
MCV: 78.3 fL — ABNORMAL LOW (ref 80.0–100.0)
Monocytes Absolute: 0.2 10*3/uL (ref 0.2–1.0)
Monocytes Relative: 6 %
NEUTROS PCT: 76 %
Neutro Abs: 2.4 10*3/uL (ref 1.4–6.5)
Platelets: 168 10*3/uL (ref 150–440)
RBC: 5 MIL/uL (ref 4.40–5.90)
RDW: 15.9 % — AB (ref 11.5–14.5)
WBC: 3.2 10*3/uL — ABNORMAL LOW (ref 3.8–10.6)

## 2018-01-09 LAB — COMPREHENSIVE METABOLIC PANEL
ALK PHOS: 62 U/L (ref 38–126)
ALT: 26 U/L (ref 0–44)
AST: 44 U/L — AB (ref 15–41)
Albumin: 3.5 g/dL (ref 3.5–5.0)
Anion gap: 10 (ref 5–15)
BUN: 13 mg/dL (ref 6–20)
CALCIUM: 9.4 mg/dL (ref 8.9–10.3)
CO2: 22 mmol/L (ref 22–32)
CREATININE: 0.73 mg/dL (ref 0.61–1.24)
Chloride: 104 mmol/L (ref 98–111)
Glucose, Bld: 151 mg/dL — ABNORMAL HIGH (ref 70–99)
Potassium: 3.8 mmol/L (ref 3.5–5.1)
SODIUM: 136 mmol/L (ref 135–145)
Total Bilirubin: 1 mg/dL (ref 0.3–1.2)
Total Protein: 8.3 g/dL — ABNORMAL HIGH (ref 6.5–8.1)

## 2018-01-09 LAB — MAGNESIUM: Magnesium: 1.9 mg/dL (ref 1.7–2.4)

## 2018-01-09 MED ORDER — SODIUM CHLORIDE 0.9% FLUSH
10.0000 mL | INTRAVENOUS | Status: DC | PRN
  Administered 2018-01-09: 10 mL via INTRAVENOUS
  Filled 2018-01-09: qty 10

## 2018-01-09 MED ORDER — SODIUM CHLORIDE 0.9 % IV SOLN
Freq: Once | INTRAVENOUS | Status: AC
  Administered 2018-01-09: 11:00:00 via INTRAVENOUS
  Filled 2018-01-09: qty 1000

## 2018-01-09 MED ORDER — HEPARIN SOD (PORK) LOCK FLUSH 100 UNIT/ML IV SOLN
500.0000 [IU] | Freq: Once | INTRAVENOUS | Status: AC
  Administered 2018-01-09: 500 [IU] via INTRAVENOUS

## 2018-01-09 MED ORDER — PANTOPRAZOLE SODIUM 40 MG PO TBEC: 40.0000 mg | DELAYED_RELEASE_TABLET | Freq: Every day | ORAL | 0 refills | Status: AC

## 2018-01-09 NOTE — Patient Instructions (Signed)
Esophagitis Esophagitis is inflammation of the esophagus. The esophagus is the tube that carries food and liquids from your mouth to your stomach. Esophagitis can cause soreness or pain in the esophagus. This condition can make it difficult and painful to swallow. What are the causes? Most causes of esophagitis are not serious. Common causes of this condition include:  Gastroesophageal reflux disease (GERD). This is when stomach contents move back up into the esophagus (reflux).  Repeated vomiting.  An allergic-type reaction, especially caused by food allergies (eosinophilic esophagitis).  Injury to the esophagus by swallowing large pills with or without water, or swallowing certain types of medicines.  Swallowing (ingesting) harmful chemicals, such as household cleaning products.  Heavy alcohol use.  An infection of the esophagus.This most often occurs in people who have a weakened immune system.  Radiation or chemotherapy treatment for cancer.  Certain diseases such as sarcoidosis, Crohn disease, and scleroderma.  What are the signs or symptoms? Symptoms of this condition include:  Difficult or painful swallowing.  Pain with swallowing acidic liquids, such as citrus juices.  Pain with burping.  Chest pain.  Difficulty breathing.  Nausea.  Vomiting.  Pain in the abdomen.  Weight loss.  Ulcers in the mouth.  Patches of white material in the mouth (candidiasis).  Fever.  Coughing up blood or vomiting blood.  Stool that is black, tarry, or bright red.  How is this diagnosed? Your health care provider will take a medical history and perform a physical exam. You may also have other tests, including:  An endoscopy to examine your stomach and esophagus with a small camera.  A test that measures the acidity level in your esophagus.  A test that measures how much pressure is on your esophagus.  A barium swallow or modified barium swallow to show the shape,  size, and functioning of your esophagus.  Allergy tests.  How is this treated? Treatment for this condition depends on the cause of your esophagitis. In some cases, steroids or other medicines may be given to help relieve your symptoms or to treat the underlying cause of your condition. You may have to make some lifestyle changes, such as:  Avoiding alcohol.  Quitting smoking.  Changing your diet.  Exercising.  Changing your sleep habits and your sleep environment.  Follow these instructions at home: Take these actions to decrease your discomfort and to help avoid complications. Diet  Follow a diet as recommended by your health care provider. This may involve avoiding foods and drinks such as: ? Coffee and tea (with or without caffeine). ? Drinks that contain alcohol. ? Energy drinks and sports drinks. ? Carbonated drinks or sodas. ? Chocolate and cocoa. ? Peppermint and mint flavorings. ? Garlic and onions. ? Horseradish. ? Spicy and acidic foods, including peppers, chili powder, curry powder, vinegar, hot sauces, and barbecue sauce. ? Citrus fruit juices and citrus fruits, such as oranges, lemons, and limes. ? Tomato-based foods, such as red sauce, chili, salsa, and pizza with red sauce. ? Fried and fatty foods, such as donuts, french fries, potato chips, and high-fat dressings. ? High-fat meats, such as hot dogs and fatty cuts of red and white meats, such as rib eye steak, sausage, ham, and bacon. ? High-fat dairy items, such as whole milk, butter, and cream cheese.  Eat small, frequent meals instead of large meals.  Avoid drinking large amounts of liquid with your meals.  Avoid eating meals during the 2-3 hours before bedtime.  Avoid lying down right   after you eat.  Do not exercise right after you eat.  Avoid foods and drinks that seem to make your symptoms worse. General instructions  Pay attention to any changes in your symptoms.  Take over-the-counter and  prescription medicines only as told by your health care provider. Do not take aspirin, ibuprofen, or other NSAIDs unless your health care provider told you to do so.  If you have trouble taking pills, use a pill splitter to decrease the size of the pill. This will decrease the chance of the pill getting stuck or injuring your esophagus on the way down. Also, drink water after you take a pill.  Do not use any tobacco products, including cigarettes, chewing tobacco, and e-cigarettes. If you need help quitting, ask your health care provider.  Wear loose-fitting clothing. Do not wear anything tight around your waist that causes pressure on your abdomen.  Raise (elevate) the head of your bed about 6 inches (15 cm).  Try to reduce your stress, such as with yoga or meditation. If you need help reducing stress, ask your health care provider.  If you are overweight, reduce your weight to an amount that is healthy for you. Ask your health care provider for guidance about a safe weight loss goal.  Keep all follow-up visits as told by your health care provider. This is important. Contact a health care provider if:  You have new symptoms.  You have unexplained weight loss.  You have difficulty swallowing, or it hurts to swallow.  You have wheezing or a persistent cough.  Your symptoms do not improve with treatment.  You have frequent heartburn for more than two weeks. Get help right away if:  You have severe pain in your arms, neck, jaw, teeth, or back.  You feel sweaty, dizzy, or light-headed.  You have chest pain or shortness of breath.  You vomit and your vomit looks like blood or coffee grounds.  Your stool is bloody or black.  You have a fever.  You cannot swallow, drink, or eat. This information is not intended to replace advice given to you by your health care provider. Make sure you discuss any questions you have with your health care provider. Document Released: 07/22/2004  Document Revised: 11/20/2015 Document Reviewed: 10/09/2014 Elsevier Interactive Patient Education  2018 Elsevier Inc.     

## 2018-01-09 NOTE — Progress Notes (Signed)
Nutrition Follow-up: Patient with lung cancer followed by Dr. Grayland Ormond.  Patient has completed radiation and chemotherapy.  Will begin immunotherapy in August.  Patient also with highly suspicious renal cell carcinoma.    Met with patient today in clinic.  Patient reports that his appetite is poor due to pain when swallowing foods.  Reports that he only ate eggs and sausage yesterday.  Has been out of ensure shakes.  Reports pain is 15 on scale of 1 to 10 with 10 being most painful.  Reports that liquid foods are little easier on swallowing.  Patient also with shortness of breath.     Medications: reviewed  Labs: reviewed  Anthropometrics:   Weight decreased to 127 lb 3.2 oz noted on 7/11 from 129 lb on 6/24.    Weight in Feb 144 lb   NUTRITION DIAGNOSIS: Malnutrition continues   MALNUTRITION DIAGNOSIS: Severe malnutrition continues   INTERVENTION:  Discussed symptoms with RN, Tillie Rung and patient will be seen by symptom management today.   Discussed with patient smooth, liquid foods.  Provided examples of those foods. 2nd case of ensure enlive provided today.  If patient only able to drink shakes will need 6 per day to better meet nutritional needs.    MONITORING, EVALUATION, GOAL: weight trends, intake   NEXT VISIT: August 8 during infusion  Wayne Chapman, [place], [place] Registered Dietitian [phone] (pager)

## 2018-01-09 NOTE — Progress Notes (Signed)
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  Has medical history of smoking, alcohol abuse and prior necrotizing pneumonia in February 2019.  Chest x-ray showed concerns for right upper lobe consolidation concerning for recurring necrotizing pneumonia versus an atypical infection or malignancy.  Had CT chest concerning for necrotizing infection or malignancy.  He was admitted to the hospital and given IV antibiotics.  Had bronchoscopy performed by Dr. Chancy Milroy revealing bronchus mass.  He was started on 21 days of Augmentin.  He was set up with Dr. Grayland Ormond for PET scan and results of biopsy.  PET scan 11/16/17 revealed large necrotic right atypical lung mass with marked hypermetabolism consistent with a neoplastic process.  A 12 mm hypermetabolic subclavicular lymph node.  2.2 cm right hypermetabolic renal lesion suspicious for primary renal cell carcinoma.   Consulted with Dr. Donella Stade on 11/18/2017.  He recommended a split course of radiation given the close proximity to the esophagus and right subclavicular node involvement.       Squamous cell lung cancer, right ([facility])   11/17/2017 Initial Diagnosis    Squamous cell lung cancer, right ([place])      11/17/2017 Cancer Staging    Staging form: Lung, AJCC 8th Edition - Clinical: Stage IIIC (cT4, cN3, cM0) - Signed by Lloyd Huger, MD on 11/23/2017      11/23/2017 - 01/10/2018 Chemotherapy   The patient had palonosetron (ALOXI) injection 0.25 mg, 0.25 mg, Intravenous,  Once, 5 of 8 cycles Administration: 0.25 mg (12/05/2017), 0.25 mg (12/12/2017), 0.25 mg (01/05/2018), 0.25 mg (12/19/2017), 0.25 mg (12/28/2017) CARBOplatin (PARAPLATIN) 210 mg in sodium chloride 0.9 % 250 mL chemo infusion, 210 mg (95.5 % of original dose 217 mg), Intravenous,  Once, 5 of 8 cycles Dose modification:   (original dose 217 mg, Cycle 1) Administration: 210 mg (12/05/2017), 210 mg (12/12/2017), 210 mg (01/05/2018), 210 mg (12/19/2017), 210 mg (12/28/2017) PACLitaxel (TAXOL) 78 mg in sodium chloride 0.9 % 250 mL chemo infusion (</= 7m/m2), 45 mg/m2 = 78 mg, Intravenous,  Once, 5 of 8 cycles Administration: 78 mg (12/05/2017), 78 mg (12/12/2017), 78 mg (01/05/2018), 78 mg (12/19/2017), 78 mg (12/28/2017)  for chemotherapy treatment.       01/08/2018 -  Chemotherapy    The patient had durvalumab (IMFINZI) 580 mg in sodium chloride 0.9 % 100 mL chemo infusion, 10 mg/kg = 580 mg, Intravenous,  Once, 0 of 6 cycles  for chemotherapy treatment.         Interval history- Wayne KIRBYpresents with pain with swallowing. The patient describes progressively worsening pain with swallowing. He states these symptoms started with radiation and chemotherapy and have gradually worsened since that time. He has reduced his oral intake because of pain. He says pain improves with liquids and 'cool' things. He was previously taken Nystatin suspension but has run out of medication. He felt this helped 'a little' with pain. He denies feeling like something is 'stuck' in his throat. He denies  regurgitating undigested food. Associated with the dysphagia, patient also complains of hoarseness and weakness with dizziness, weight loss. Unfortunately he continues to smoke.   ECOG FS:1 - Symptomatic but completely ambulatory  Review of systems- Review of Systems  Constitutional: Positive for weight loss. Negative for chills, diaphoresis, fever and  malaise/fatigue.  HENT: Positive for sore throat. Negative for congestion, ear discharge, ear pain, sinus pain and tinnitus.   Eyes: Negative.   Respiratory: Negative.  Negative for cough, sputum production and shortness of breath.   Cardiovascular: Negative for chest pain, palpitations, orthopnea, claudication and leg swelling.  Gastrointestinal: Positive for heartburn. Negative for abdominal pain, blood in stool, constipation, diarrhea, nausea and vomiting.  Genitourinary: Negative.   Musculoskeletal: Negative.   Skin: Negative.   Neurological: Positive for speech change and weakness. Negative for dizziness, tingling, tremors, loss of consciousness and headaches.  Endo/Heme/Allergies: Negative.   Psychiatric/Behavioral: Negative.      Current treatment- Paclitaxel-Carboplatin  No Known Allergies   Past Medical History:  Diagnosis Date  . Anxiety   . Depression     Past Surgical History:  Procedure Laterality Date  . FLEXIBLE BRONCHOSCOPY N/A 11/11/2017   Procedure: FLEXIBLE BRONCHOSCOPY;  Surgeon: Allyne Gee, MD;  Location: [facility] ORS;  Service: Pulmonary;  Laterality: N/A;  . none    . PORTA CATH INSERTION N/A 11/29/2017   Procedure: PORTA CATH INSERTION;  Surgeon: Katha Cabal, MD;  Location: [facility];  Service: Cardiovascular;  Laterality: N/A;    Social History   Socioeconomic History  . Marital status: Single    Spouse name: Not on file  . Number of children: Not on file  . Years of education: Not on file  . Highest education level: Not on file  Occupational History  . Not on file  Social Needs  . Financial resource strain: Not on file  . Food insecurity:    Worry: Not on file    Inability: Not on file  . Transportation needs:    Medical: Not on file    Non-medical: Not on file  Tobacco Use  . Smoking status: Current Every Day Smoker    Packs/day: 1.00    Types: Cigarettes  . Smokeless tobacco: Never Used  Substance and Sexual Activity    . Alcohol use: Yes    Comment: alot   . Drug use: Yes  . Sexual activity: Yes  Lifestyle  . Physical activity:    Days per week: Not on file    Minutes per session: Not on file  . Stress: Not on file  Relationships  . Social connections:    Talks on phone: Not on file    Gets together: Not on file    Attends religious service: Not on file    Active member of club or organization: Not on file    Attends meetings of clubs or organizations: Not on file    Relationship status: Not on file  . Intimate partner violence:    Fear of current or ex partner: Not on file    Emotionally abused: Not on file    Physically abused: Not on file    Forced sexual activity: Not on file  Other Topics Concern  . Not on file  Social History Narrative  . Not on file    Family History  Problem Relation Age of Onset  . Diabetes Mellitus II Mother      Current Outpatient Medications:  .  diphenhydrAMINE (BENADRYL) 25 MG tablet, Take  25 mg by mouth every morning., Disp: , Rfl:  .  feeding supplement, ENSURE ENLIVE, (ENSURE ENLIVE) LIQD, Take 237 mLs by mouth 3 (three) times daily between meals., Disp: 237 mL, Rfl: 12 .  megestrol (MEGACE) 40 MG tablet, Take 1 tablet (40 mg total) by mouth daily., Disp: 30 tablet, Rfl: 1 .  naproxen sodium (ALEVE) 220 MG tablet, Take 220 mg by mouth daily. , Disp: , Rfl:  .  prochlorperazine (COMPAZINE) 10 MG tablet, Take 10 mg by mouth every 6 (six) hours as needed for nausea or vomiting., Disp: , Rfl:  .  pantoprazole (PROTONIX) 40 MG tablet, Take 1 tablet (40 mg total) by mouth daily., Disp: 90 tablet, Rfl: 0 .  thiamine 100 MG tablet, Take 1 tablet (100 mg total) by mouth daily. (Patient not taking: Reported on 01/05/2018), Disp: 30 tablet, Rfl: 0 No current facility-administered medications for this visit.   Facility-Administered Medications Ordered in Other Visits:  .  0.9 %  sodium chloride infusion, , Intravenous, Once, Verlon Au, NP  Physical exam:   Vitals:   01/09/18 1121  BP: 103/73  Pulse: (!) 101  Temp: 98 F (36.7 C)  TempSrc: Tympanic  SpO2: 99%   General: Well-developed, well-nourished, no acute distress Eyes: Pink conjunctiva, anicteric sclera HENT: oropharynx clear and moist. No lesions or erythema noted Lungs: Clear to auscultation bilaterally Heart: Regular rate and rhythm. No rubs, murmurs, or gallops Abdomen: Soft, nontender, nondistended. No organomegaly noted, normoactive bowel sounds Musculoskeletal: No edema, cyanosis, or clubbing Neuro: Alert, answering all questions appropriately. Cranial nerves grossly intact Skin: No rashes or petechiae noted Psych: Normal affect  CMP Latest Ref Rng & Units 01/09/2018  Glucose 70 - 99 mg/dL 151(H)  BUN 6 - 20 mg/dL 13  Creatinine 0.61 - 1.24 mg/dL 0.73  Sodium 135 - 145 mmol/L 136  Potassium 3.5 - 5.1 mmol/L 3.8  Chloride 98 - 111 mmol/L 104  CO2 22 - 32 mmol/L 22  Calcium 8.9 - 10.3 mg/dL 9.4  Total Protein 6.5 - 8.1 g/dL 8.3(H)  Total Bilirubin 0.3 - 1.2 mg/dL 1.0  Alkaline Phos 38 - 126 U/L 62  AST 15 - 41 U/L 44(H)  ALT 0 - 44 U/L 26   CBC Latest Ref Rng & Units 01/09/2018  WBC 3.8 - 10.6 K/uL 3.2(L)  Hemoglobin 13.0 - 18.0 g/dL 12.9(L)  Hematocrit 40.0 - 52.0 % 39.1(L)  Platelets 150 - 440 K/uL 168    No images are attached to the encounter.  No results found.   Assessment and plan- Patient is a 62 y.o. male with lung cancer, who [facility] for radiation induced esophagitis and poor oral intake.   1.  Stage IIIc squamous cell carcinoma of the right upper lobe of lung-PET scan from 11/16/2017 confirming stage of disease.  MRI of brain from 12/01/2017 showed no evidence of metastatic disease.  Currently s/p cycle 5 weekly carboplatinum-taxol and completed radiation. He will now received maintenance Imfinzi q2w for up to 12 months. Per Dr. Grayland Ormond, plans to re-image in August or 06-Apr-2018.   2. Renal Mass- Concerning for  renal cell carcinoma. Was discussed with Urology and will consider possible nephrectomy after treatments for lung cancer.   3. Radiation Induced Esophagitis- Start Protonix daily. Has completed course of Nystatin. Start Magic Mouthwash (prescription sent today). Discussed how smoking can worsen symptoms and to avoid smoking.   4. Malnutrition- weakness and dizziness likely r/t poor oral intake and dehydration. 1L  NaCl given in clinic today with improvement in dehydration. Followed by dietician. Patient was encouraged to increase intake of supplemental shakes. Encouraged intake of cool/cold fluids and to avoid spicy or hard things as this may irritate and worsen his symptoms.   5. Tobacco Use Disorder- I addressed the importance of smoking cessation with the patient in detail.  We discussed the health benefits of cessation. We discussed the health detriments of ongoing tobacco use including but not limited to COPD, heart disease and malignancy. We discussed that given his co-morbidities it is especially important that he quit smoking.   We discussed the pathophysiology of nicotine dependence and different strategies to stop smoking. We reviewed strategies to maximize success including removing cigarettes and smoking materials from environment, stress management, substitution of other forms of reinforcement, support of family/friends, written materials, local smoking cessation programs such as [employer]uit[employer], and pharmacotherapy (Chantix, Nicotine Patches/Gums/Lozenges). We discussed that the gold standard of tobacco cessation is nicotine replacement (with nicotine patches & gum/lozenges) or Chantix or Bupropion.   We discussed that the typical craving/urge to smoke lasts about 3-5 minutes; his biggest trigger(s) to use cigarettes are stress.  Encouraged him to set some new "rules" that do not allow him to smoke in the car; instead he could use a piece of nicotine gum or a lozenge when he has a craving.  Instructions on how to use these nicotine replacement products was discussed.  I shared that there is limited data to suggest that e-cigarettes help in smoking cessation efforts; we do know that many of the flavorings in e-cigarettes have known carcinogens.    Given his likely high dependence (Fagenstrom score 10), he will need continued support with regards to smoking cessation. We discussed that data suggests that "cold [place]" is the least effective way to stop using tobacco products.  Having a clear "quit plan" is much more effective and requires a step-wise approach with continued support from a tobacco treatment specialist.   We reviewed the multiple options for cessation and I offered that in my experience, starting the [facility] program followed by pharmacologic interventions has had the most success with documented success rates of > 65%. Today, he accepts referral to [facility].  I encouraged him to reach out to me if he has further questions or interest in his continued cessation efforts.  Recommend continued discussions and management by PCP and primary oncology team.   Greater than 10 minutes was spent in smoking cessation counseling with this patient.       Visit Diagnosis 1. Squamous cell lung cancer, right ([facility])   2. Radiation-induced esophagitis   3. Dehydration     Patient expressed understanding and was in agreement with this plan. He also understands that He can call clinic at any time with any questions, concerns, or complaints.    Beckey Rutter, DNP, AGNP-C [place] [facility] [phone] (work cell) [phone] (office) 01/09/18 12:43 PM

## 2018-01-11 ENCOUNTER — Ambulatory Visit
Admission: RE | Admit: 2018-01-11 | Discharge: 2018-01-11 | Disposition: A | Discharge status: Home / Self Care | Payer: Medicare Other | Source: Ambulatory Visit | Attending: Radiation Oncology | Admitting: Radiation Oncology

## 2018-01-11 ENCOUNTER — Other Ambulatory Visit: Payer: Self-pay

## 2018-01-11 ENCOUNTER — Telehealth: Payer: Self-pay | Admitting: *Deleted

## 2018-01-11 VITALS — BP 107/69 | HR 110 | Temp 96.6°F | Resp 18 | Wt 125.2 lb

## 2018-01-11 DIAGNOSIS — C3411 Malignant neoplasm of upper lobe, right bronchus or lung: Secondary | ICD-10-CM

## 2018-01-11 NOTE — Telephone Encounter (Signed)
Left voicemail for patient to return my call if he is interested in the [place] smoking cessation program.

## 2018-01-11 NOTE — Progress Notes (Signed)
Here for follow up. Pt continues to have diff swallowing. Losing weight ,taking ensure 3 x per day

## 2018-01-11 NOTE — Progress Notes (Signed)
Radiation Oncology Follow up Note  Name: Wayne Chapman   Date:   01/11/2018 MRN:  [id number] DOB: 09/17/1955    This 62 y.o. male presents to the clinic today for 1 week reevaluation status post initial course of radiation therapy for stage IIIB (T4 N3 M0) non-small cell lung cancer favoring squamous cell carcinoma the right upper lobe.  REFERRING PROVIDER: No ref. provider found  HPI: patient is a 62 year old male who was completed a four-week initial course of concurrent chemoradiation therapy for stage IIIB non-small cell lung cancer of the right upper lobe he's been having some dysphasia although this was is resolving nicely. He specifically denies cough hemoptysis or chest tightness..  COMPLICATIONS OF TREATMENT: none  FOLLOW UP COMPLIANCE: keeps appointments   PHYSICAL EXAM:  BP 107/69 (BP Location: Right Arm, Patient Position: Sitting, Cuff Size: Small)   Pulse (!) 110   Temp (!) 96.6 F (35.9 C) (Tympanic)   Resp 18   Wt 125 lb 3.5 oz (56.8 kg)   BMI 16.30 kg/m  Well-developed well-nourished patient in NAD. HEENT reveals PERLA, EOMI, discs not visualized.  Oral cavity is clear. No oral mucosal lesions are identified. Neck is clear without evidence of cervical or supraclavicular adenopathy. Lungs are clear to A&P. Cardiac examination is essentially unremarkable with regular rate and rhythm without murmur rub or thrill. Abdomen is benign with no organomegaly or masses noted. Motor sensory and DTR levels are equal and symmetric in the upper and lower extremities. Cranial nerves II through XII are grossly intact. Proprioception is intact. No peripheral adenopathy or edema is identified. No motor or sensory levels are noted. Crude visual fields are within normal range.  RADIOLOGY RESULTS: no current films for review  PLAN: at the present time I havepersonally set him up for CT simulation for possible small field boost. We'll evaluate her response at that time. He otherwise clinically  he is doing well. Will review his films with him when available. Patient will continue with concurrent chemoradiation.  I would like to take this opportunity to thank you for allowing me to participate in the care of your patient.Noreene Filbert, MD

## 2018-01-12 ENCOUNTER — Ambulatory Visit: Payer: Medicare Other

## 2018-01-18 ENCOUNTER — Ambulatory Visit
Admission: RE | Admit: 2018-01-18 | Discharge: 2018-01-18 | Disposition: A | Discharge status: Home / Self Care | Payer: Medicare Other | Source: Ambulatory Visit | Attending: Radiation Oncology | Admitting: Radiation Oncology

## 2018-01-18 DIAGNOSIS — Z510 Encounter for antineoplastic radiation therapy: Secondary | ICD-10-CM | POA: Diagnosis not present

## 2018-01-19 DIAGNOSIS — Z510 Encounter for antineoplastic radiation therapy: Secondary | ICD-10-CM | POA: Diagnosis not present

## 2018-01-25 ENCOUNTER — Ambulatory Visit
Admission: RE | Admit: 2018-01-25 | Discharge: 2018-01-25 | Disposition: A | Discharge status: Home / Self Care | Payer: Medicare Other | Source: Ambulatory Visit | Attending: Radiation Oncology | Admitting: Radiation Oncology

## 2018-01-25 DIAGNOSIS — Z510 Encounter for antineoplastic radiation therapy: Secondary | ICD-10-CM | POA: Diagnosis not present

## 2018-01-26 ENCOUNTER — Ambulatory Visit
Admission: RE | Admit: 2018-01-26 | Discharge: 2018-01-26 | Disposition: A | Discharge status: Home / Self Care | Payer: Medicare Other | Source: Ambulatory Visit | Attending: Radiation Oncology | Admitting: Radiation Oncology

## 2018-01-26 ENCOUNTER — Other Ambulatory Visit: Payer: Self-pay | Admitting: *Deleted

## 2018-01-26 DIAGNOSIS — C3411 Malignant neoplasm of upper lobe, right bronchus or lung: Secondary | ICD-10-CM | POA: Diagnosis not present

## 2018-01-26 DIAGNOSIS — Z510 Encounter for antineoplastic radiation therapy: Secondary | ICD-10-CM | POA: Insufficient documentation

## 2018-01-26 MED ORDER — LIDOCAINE-PRILOCAINE 2.5-2.5 % EX CREA: 1.0000 "application " | TOPICAL_CREAM | CUTANEOUS | 1 refills | Status: AC | PRN

## 2018-01-26 NOTE — Progress Notes (Signed)
Nutrition Follow-up:  Patient with lung cancer followed by Dr. Grayland Ormond.  Patient  receiving small field boost per Dr. Baruch Gouty and planning to start immunotherapy.    Met with patient today at his request due to needing more ensure following radiation.  Patient reports that his appetite has improved and swallowing is better (radiation esophagitis).  Reports that he has been eating chicken, mashed potatoes with gravy, pinto beans, cabbage, bologna and cheese sandwiches.  Reports that he is drinking 3 ensure enlive per day.     Medications: reviewed  Labs: glucose 151  Anthropometrics:   Weight slight decrease to 125 lb noted on 7/17 from 127 lb.     NUTRITION DIAGNOSIS: Malnutrition continues   MALNUTRITION DIAGNOSIS: severe malnutrition continues   INTERVENTION:  Discussed with patient importance of continuing to eat high calorie, high protein foods to keep weight from dropping.  3rd case of ensure enlive given today.  Encouraged patient to continue to drink at least 3-4 every day in addition to eat 3 meals to prevent weight loss.  Patient verbalized understanding.       MONITORING, EVALUATION, GOAL: weight trends, intake   NEXT VISIT: August 8 during infusion  Wayne Chapman, [place], [place] Registered Dietitian [phone] (pager)

## 2018-01-27 ENCOUNTER — Ambulatory Visit
Admission: RE | Admit: 2018-01-27 | Discharge: 2018-01-27 | Disposition: A | Discharge status: Home / Self Care | Payer: Medicare Other | Source: Ambulatory Visit | Attending: Radiation Oncology | Admitting: Radiation Oncology

## 2018-01-27 DIAGNOSIS — Z510 Encounter for antineoplastic radiation therapy: Secondary | ICD-10-CM | POA: Diagnosis not present

## 2018-01-30 ENCOUNTER — Ambulatory Visit: Payer: Medicare Other

## 2018-01-30 NOTE — Progress Notes (Signed)
[place]  Telephone:([phone] Fax:([phone]  ID: Wayne Chapman OB: 04/30/1956  MR#: [id number]  FWY#:[id number]  Patient Care Team: Patient, [name] as PCP - General (General [facility]) Telford Nab, RN as Registered Nurse  CHIEF COMPLAINT: Clinical stage IIIc squamous cell carcinoma of the right upper lobe lung.  INTERVAL HISTORY: Patient returns to clinic today for further evaluation and consideration of maintenance Imfinzi.  He currently feels well and is asymptomatic.  His appetite has improved and he is gaining weight.  He has no neurologic complaints.  He denies any recent fevers or illnesses.  He denies any chest pain, shortness of breath, cough, or hemoptysis.  He has no nausea, vomiting, constipation, or diarrhea.  He has no urinary complaints.  Patient offers no specific complaints today.  REVIEW OF SYSTEMS:   Review of Systems  Constitutional: Negative.  Negative for fever, malaise/fatigue and weight loss.  Respiratory: Negative.  Negative for cough, hemoptysis and shortness of breath.   Cardiovascular: Negative.  Negative for chest pain and leg swelling.  Gastrointestinal: Negative.  Negative for abdominal pain, constipation and nausea.  Genitourinary: Negative.  Negative for dysuria.  Musculoskeletal: Negative.  Negative for back pain and joint pain.  Skin: Negative.  Negative for rash.  Neurological: Negative.  Negative for sensory change, focal weakness, weakness and headaches.  Psychiatric/Behavioral: Negative.  The patient is not nervous/anxious.     As per HPI. Otherwise, a complete review of systems is negative.  PAST MEDICAL HISTORY: Past Medical History:  Diagnosis Date  . Anxiety   . Depression     PAST SURGICAL HISTORY: Past Surgical History:  Procedure Laterality Date  . FLEXIBLE BRONCHOSCOPY N/A 11/11/2017   Procedure: FLEXIBLE BRONCHOSCOPY;  Surgeon: Allyne Gee, MD;  Location: [facility] ORS;  Service: Pulmonary;   Laterality: N/A;  . none    . PORTA CATH INSERTION N/A 11/29/2017   Procedure: PORTA CATH INSERTION;  Surgeon: Katha Cabal, MD;  Location: [facility];  Service: Cardiovascular;  Laterality: N/A;    FAMILY HISTORY: Family History  Problem Relation Age of Onset  . Diabetes Mellitus II Mother     ADVANCED DIRECTIVES (Y/N):  N  HEALTH MAINTENANCE: Social History   Tobacco Use  . Smoking status: Current Every Day Smoker    Packs/day: 1.00    Types: Cigarettes  . Smokeless tobacco: Never Used  Substance Use Topics  . Alcohol use: Yes    Comment: alot   . Drug use: Yes     Colonoscopy:  PAP:  Bone density:  Lipid panel:  No Known Allergies  Current Outpatient Medications  Medication Sig Dispense Refill  . diphenhydrAMINE (BENADRYL) 25 MG tablet Take 25 mg by mouth every morning.    . feeding supplement, ENSURE ENLIVE, (ENSURE ENLIVE) LIQD Take 237 mLs by mouth 3 (three) times daily between meals. 237 mL 12  . lidocaine-prilocaine (EMLA) cream Apply 1 application topically as needed. Apply small amount to port site at least 1 hour prior to it being accessed, cover with plastic wrap 30 g 1  . megestrol (MEGACE) 40 MG tablet Take 1 tablet (40 mg total) by mouth daily. 30 tablet 1  . naproxen sodium (ALEVE) 220 MG tablet Take 220 mg by mouth daily.     . NON FORMULARY     . ondansetron (ZOFRAN) 8 MG tablet Take 8 mg by mouth 2 (two) times daily.    . pantoprazole (PROTONIX) 40 MG tablet Take 1 tablet (  40 mg total) by mouth daily. 90 tablet 0  . prochlorperazine (COMPAZINE) 10 MG tablet Take 10 mg by mouth every 6 (six) hours as needed for nausea or vomiting.    . thiamine 100 MG tablet Take 1 tablet (100 mg total) by mouth daily. (Patient not taking: Reported on 01/05/2018) 30 tablet 0   No current facility-administered medications for this visit.     OBJECTIVE: Vitals:   02/02/18 1014  BP: 109/81  Pulse: 88  Resp: 18  Temp: 97.7 F (36.5 C)  SpO2: 98%      Body mass index is 16.86 kg/m.    ECOG FS:0 - Asymptomatic  General: Well-developed, well-nourished, no acute distress. Eyes: Pink conjunctiva, anicteric sclera. HEENT: Normocephalic, moist mucous membranes. Lungs: Clear to auscultation bilaterally. Heart: Regular rate and rhythm. No rubs, murmurs, or gallops. Abdomen: Soft, nontender, nondistended. No organomegaly noted, normoactive bowel sounds. Musculoskeletal: No edema, cyanosis, or clubbing. Neuro: Alert, answering all questions appropriately. Cranial nerves grossly intact. Skin: No rashes or petechiae noted. Psych: Normal affect.  LAB RESULTS:  Lab Results  Component Value Date   NA 132 (L) 02/02/2018   K 4.2 02/02/2018   CL 100 02/02/2018   CO2 24 02/02/2018   GLUCOSE 108 (H) 02/02/2018   BUN 9 02/02/2018   CREATININE 0.65 02/02/2018   CALCIUM 9.7 02/02/2018   PROT 8.6 (H) 02/02/2018   ALBUMIN 3.8 02/02/2018   AST 30 02/02/2018   ALT 19 02/02/2018   ALKPHOS 65 02/02/2018   BILITOT 0.4 02/02/2018   GFRNONAA >60 02/02/2018   GFRAA >60 02/02/2018    Lab Results  Component Value Date   WBC 4.6 02/02/2018   NEUTROABS 3.2 02/02/2018   HGB 11.6 (L) 02/02/2018   HCT 35.6 (L) 02/02/2018   MCV 78.7 (L) 02/02/2018   PLT 280 02/02/2018     STUDIES: No results found.  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  PLAN:    1. 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  MRI of the brain from December 01, 2017 reviewed independently with no evidence of metastatic disease.  Patient completed his concurrent carboplatinum and Taxol along with XRT on January 05, 2018.  Proceed with cycle 1 of maintenance Imfinzi today.  Patient will retrieve treatment every 2 weeks for up to 12 months.  Patient will require reimaging in the next several weeks.  Return to clinic in 2 weeks for  further evaluation and consideration of cycle 2.   2.  Renal mass: Highly suspicious for renal cell carcinoma.  Case discussed with urology.  Will continue to monitor and consider possible nephrectomy at the conclusion of his treatments for his squamous cell carcinoma of lung.  Reimaging as above. 3.  Hyponatremia: Patient's sodium is mildly decreased today at 132.  Monitor. 4.  Nausea: Patient does not complain of this today.  Continue Zofran as needed. 5.  Leukopenia: Resolved. 6.  Anemia: Mild, monitor.  Patient expressed understanding and was in agreement with this plan. He also understands that He can call clinic at any time with any questions, concerns, or complaints.   Cancer Staging Squamous cell lung cancer, right ([place]) Staging form: Lung, AJCC 8th Edition - Clinical: Stage IIIC (cT4, cN3, cM0) - Signed by Lloyd Huger, MD on 11/23/2017   Lloyd Huger, MD   02/05/2018 8:06 AM

## 2018-01-31 ENCOUNTER — Ambulatory Visit
Admission: RE | Admit: 2018-01-31 | Discharge: 2018-01-31 | Disposition: A | Discharge status: Home / Self Care | Payer: Medicare Other | Source: Ambulatory Visit | Attending: Radiation Oncology | Admitting: Radiation Oncology

## 2018-01-31 DIAGNOSIS — Z510 Encounter for antineoplastic radiation therapy: Secondary | ICD-10-CM | POA: Diagnosis not present

## 2018-02-01 ENCOUNTER — Ambulatory Visit
Admission: RE | Admit: 2018-02-01 | Discharge: 2018-02-01 | Disposition: A | Discharge status: Home / Self Care | Payer: Medicare Other | Source: Ambulatory Visit | Attending: Radiation Oncology | Admitting: Radiation Oncology

## 2018-02-01 DIAGNOSIS — Z510 Encounter for antineoplastic radiation therapy: Secondary | ICD-10-CM | POA: Diagnosis not present

## 2018-02-02 ENCOUNTER — Inpatient Hospital Stay: Payer: Medicare Other | Attending: Oncology

## 2018-02-02 ENCOUNTER — Encounter: Payer: Self-pay | Admitting: Oncology

## 2018-02-02 ENCOUNTER — Inpatient Hospital Stay: Payer: Medicare Other

## 2018-02-02 ENCOUNTER — Ambulatory Visit
Admission: RE | Admit: 2018-02-02 | Discharge: 2018-02-02 | Disposition: A | Discharge status: Home / Self Care | Payer: Medicare Other | Source: Ambulatory Visit | Attending: Radiation Oncology | Admitting: Radiation Oncology

## 2018-02-02 ENCOUNTER — Inpatient Hospital Stay (HOSPITAL_BASED_OUTPATIENT_CLINIC_OR_DEPARTMENT_OTHER): Payer: Medicare Other | Admitting: Oncology

## 2018-02-02 VITALS — BP 109/81 | HR 88 | Temp 97.7°F | Resp 18 | Wt 129.6 lb

## 2018-02-02 DIAGNOSIS — N2889 Other specified disorders of kidney and ureter: Secondary | ICD-10-CM | POA: Diagnosis not present

## 2018-02-02 DIAGNOSIS — R110 Nausea: Secondary | ICD-10-CM | POA: Insufficient documentation

## 2018-02-02 DIAGNOSIS — Z923 Personal history of irradiation: Secondary | ICD-10-CM

## 2018-02-02 DIAGNOSIS — Z5112 Encounter for antineoplastic immunotherapy: Secondary | ICD-10-CM | POA: Diagnosis not present

## 2018-02-02 DIAGNOSIS — F418 Other specified anxiety disorders: Secondary | ICD-10-CM | POA: Diagnosis not present

## 2018-02-02 DIAGNOSIS — F17210 Nicotine dependence, cigarettes, uncomplicated: Secondary | ICD-10-CM

## 2018-02-02 DIAGNOSIS — C3491 Malignant neoplasm of unspecified part of right bronchus or lung: Secondary | ICD-10-CM

## 2018-02-02 DIAGNOSIS — E871 Hypo-osmolality and hyponatremia: Secondary | ICD-10-CM | POA: Diagnosis not present

## 2018-02-02 DIAGNOSIS — Z79899 Other long term (current) drug therapy: Secondary | ICD-10-CM | POA: Insufficient documentation

## 2018-02-02 DIAGNOSIS — Z510 Encounter for antineoplastic radiation therapy: Secondary | ICD-10-CM | POA: Diagnosis not present

## 2018-02-02 DIAGNOSIS — C3411 Malignant neoplasm of upper lobe, right bronchus or lung: Secondary | ICD-10-CM

## 2018-02-02 DIAGNOSIS — Z9221 Personal history of antineoplastic chemotherapy: Secondary | ICD-10-CM | POA: Diagnosis not present

## 2018-02-02 DIAGNOSIS — D649 Anemia, unspecified: Secondary | ICD-10-CM | POA: Diagnosis not present

## 2018-02-02 LAB — CBC WITH DIFFERENTIAL/PLATELET
BASOS ABS: 0 10*3/uL (ref 0–0.1)
Basophils Relative: 1 %
Eosinophils Absolute: 0 10*3/uL (ref 0–0.7)
Eosinophils Relative: 1 %
HEMATOCRIT: 35.6 % — AB (ref 40.0–52.0)
HEMOGLOBIN: 11.6 g/dL — AB (ref 13.0–18.0)
LYMPHS ABS: 0.8 10*3/uL — AB (ref 1.0–3.6)
LYMPHS PCT: 18 %
MCH: 25.7 pg — ABNORMAL LOW (ref 26.0–34.0)
MCHC: 32.7 g/dL (ref 32.0–36.0)
MCV: 78.7 fL — ABNORMAL LOW (ref 80.0–100.0)
MONOS PCT: 14 %
Monocytes Absolute: 0.6 10*3/uL (ref 0.2–1.0)
NEUTROS ABS: 3.2 10*3/uL (ref 1.4–6.5)
Neutrophils Relative %: 66 %
Platelets: 280 10*3/uL (ref 150–440)
RBC: 4.52 MIL/uL (ref 4.40–5.90)
RDW: 18.2 % — AB (ref 11.5–14.5)
WBC: 4.6 10*3/uL (ref 3.8–10.6)

## 2018-02-02 LAB — COMPREHENSIVE METABOLIC PANEL
ALBUMIN: 3.8 g/dL (ref 3.5–5.0)
ALT: 19 U/L (ref 0–44)
ANION GAP: 8 (ref 5–15)
AST: 30 U/L (ref 15–41)
Alkaline Phosphatase: 65 U/L (ref 38–126)
BILIRUBIN TOTAL: 0.4 mg/dL (ref 0.3–1.2)
BUN: 9 mg/dL (ref 6–20)
CALCIUM: 9.7 mg/dL (ref 8.9–10.3)
CO2: 24 mmol/L (ref 22–32)
Chloride: 100 mmol/L (ref 98–111)
Creatinine, Ser: 0.65 mg/dL (ref 0.61–1.24)
GFR calc Af Amer: >60 mL/min (ref >60)
GLUCOSE: 108 mg/dL — AB (ref 70–99)
POTASSIUM: 4.2 mmol/L (ref 3.5–5.1)
Sodium: 132 mmol/L — ABNORMAL LOW (ref 135–145)
TOTAL PROTEIN: 8.6 g/dL — AB (ref 6.5–8.1)

## 2018-02-02 MED ORDER — SODIUM CHLORIDE 0.9% FLUSH
10.0000 mL | INTRAVENOUS | Status: DC | PRN
  Filled 2018-02-02: qty 10

## 2018-02-02 MED ORDER — HEPARIN SOD (PORK) LOCK FLUSH 100 UNIT/ML IV SOLN
500.0000 [IU] | Freq: Once | INTRAVENOUS | Status: AC | PRN
  Administered 2018-02-02: 500 [IU]

## 2018-02-02 MED ORDER — SODIUM CHLORIDE 0.9 % IV SOLN
Freq: Once | INTRAVENOUS | Status: AC
  Administered 2018-02-02: 11:00:00 via INTRAVENOUS
  Filled 2018-02-02: qty 1000

## 2018-02-02 MED ORDER — SODIUM CHLORIDE 0.9 % IV SOLN
Freq: Once | INTRAVENOUS | Status: DC
  Filled 2018-02-02: qty 1000

## 2018-02-02 MED ORDER — DURVALUMAB 500 MG/10ML IV SOLN
620.0000 mg | Freq: Once | INTRAVENOUS | Status: AC
  Administered 2018-02-02: 620 mg via INTRAVENOUS
  Filled 2018-02-02: qty 2.4

## 2018-02-02 NOTE — Progress Notes (Signed)
Nutrition Follow-up:  Patient with lung cancer followed by Dr. Grayland Ormond.  Patient receiving small field boost radiation and immunotherapy.    Met with patient today during infusion.  Patient reports appetite is better and really trying to eat better.  Reports drinking ensure.  "I have gained some weight back."    Medications: reviewed  Labs: reviewed  Anthropometrics:   Weight increased to 129 lb 9 oz today from 125 lb on 8/1   NUTRITION DIAGNOSIS: Malnutrition continues   MALNUTRITION DIAGNOSIS: severe malnutrition continues   INTERVENTION:  Congratulated patient on weight gain.  Encouraged high to focus on good nutrition Provided additional case of ensure enlive today to provide additional calories and protein and aide in weight gain    MONITORING, EVALUATION, GOAL: weight trends, intake   NEXT VISIT: to be determined  Wayne Chapman, [place], [place] Registered Dietitian [phone] (pager)

## 2018-02-02 NOTE — Progress Notes (Signed)
Sodium 132, MD aware, NNO at this time, okay to proceed with treatment.

## 2018-02-02 NOTE — Progress Notes (Signed)
Pt in for follow up and treatment today.  Reports "doing well" and "appetie has improved".  Pt has gained 4.6 lbs since 01/11/18.

## 2018-02-03 ENCOUNTER — Ambulatory Visit
Admission: RE | Admit: 2018-02-03 | Discharge: 2018-02-03 | Disposition: A | Discharge status: Home / Self Care | Payer: Medicare Other | Source: Ambulatory Visit | Attending: Radiation Oncology | Admitting: Radiation Oncology

## 2018-02-03 DIAGNOSIS — Z510 Encounter for antineoplastic radiation therapy: Secondary | ICD-10-CM | POA: Diagnosis not present

## 2018-02-06 ENCOUNTER — Ambulatory Visit
Admission: RE | Admit: 2018-02-06 | Discharge: 2018-02-06 | Disposition: A | Discharge status: Home / Self Care | Payer: Medicare Other | Source: Ambulatory Visit | Attending: Radiation Oncology | Admitting: Radiation Oncology

## 2018-02-06 ENCOUNTER — Inpatient Hospital Stay: Payer: Medicare Other

## 2018-02-06 DIAGNOSIS — Z510 Encounter for antineoplastic radiation therapy: Secondary | ICD-10-CM | POA: Diagnosis not present

## 2018-02-07 ENCOUNTER — Ambulatory Visit
Admission: RE | Admit: 2018-02-07 | Discharge: 2018-02-07 | Disposition: A | Discharge status: Home / Self Care | Payer: Medicare Other | Source: Ambulatory Visit | Attending: Radiation Oncology | Admitting: Radiation Oncology

## 2018-02-07 DIAGNOSIS — Z510 Encounter for antineoplastic radiation therapy: Secondary | ICD-10-CM | POA: Diagnosis not present

## 2018-02-08 ENCOUNTER — Ambulatory Visit
Admission: RE | Admit: 2018-02-08 | Discharge: 2018-02-08 | Disposition: A | Discharge status: Home / Self Care | Payer: Medicare Other | Source: Ambulatory Visit | Attending: Radiation Oncology | Admitting: Radiation Oncology

## 2018-02-08 DIAGNOSIS — Z510 Encounter for antineoplastic radiation therapy: Secondary | ICD-10-CM | POA: Diagnosis not present

## 2018-02-09 ENCOUNTER — Ambulatory Visit
Admission: RE | Admit: 2018-02-09 | Discharge: 2018-02-09 | Disposition: A | Discharge status: Home / Self Care | Payer: Medicare Other | Source: Ambulatory Visit | Attending: Radiation Oncology | Admitting: Radiation Oncology

## 2018-02-09 DIAGNOSIS — Z510 Encounter for antineoplastic radiation therapy: Secondary | ICD-10-CM | POA: Diagnosis not present

## 2018-02-10 ENCOUNTER — Ambulatory Visit
Admission: RE | Admit: 2018-02-10 | Discharge: 2018-02-10 | Disposition: A | Discharge status: Home / Self Care | Payer: Medicare Other | Source: Ambulatory Visit | Attending: Radiation Oncology | Admitting: Radiation Oncology

## 2018-02-10 DIAGNOSIS — Z510 Encounter for antineoplastic radiation therapy: Secondary | ICD-10-CM | POA: Diagnosis not present

## 2018-02-12 ENCOUNTER — Ambulatory Visit: Admission: RE | Admit: 2018-02-12 | Payer: Medicare Other | Source: Ambulatory Visit

## 2018-02-13 ENCOUNTER — Ambulatory Visit: Payer: Medicare Other

## 2018-02-14 ENCOUNTER — Ambulatory Visit: Payer: Medicare Other

## 2018-02-14 ENCOUNTER — Ambulatory Visit
Admission: RE | Admit: 2018-02-14 | Discharge: 2018-02-14 | Disposition: A | Discharge status: Home / Self Care | Payer: Medicare Other | Source: Ambulatory Visit | Attending: Radiation Oncology | Admitting: Radiation Oncology

## 2018-02-14 DIAGNOSIS — Z510 Encounter for antineoplastic radiation therapy: Secondary | ICD-10-CM | POA: Diagnosis not present

## 2018-02-14 NOTE — Progress Notes (Signed)
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  INTERVAL HISTORY: Patient returns to clinic today for further evaluation and consideration of cycle 2 of maintenance Imfinzi.  He tolerated his first treatment well without significant side effects.  He currently feels well and is asymptomatic.  He has a good appetite and his weight has remained stable. He has no neurologic complaints.  He denies any recent fevers or illnesses.  He denies any chest pain, shortness of breath, cough, or hemoptysis.  He has no nausea, vomiting, constipation, or diarrhea.  He has no urinary complaints.  Patient feels at his baseline offers no specific complaints today.  REVIEW OF SYSTEMS:   Review of Systems  Constitutional: Negative.  Negative for fever, malaise/fatigue and weight loss.  Respiratory: Negative.  Negative for cough, hemoptysis and shortness of breath.   Cardiovascular: Negative.  Negative for chest pain and leg swelling.  Gastrointestinal: Negative.  Negative for abdominal pain, constipation and nausea.  Genitourinary: Negative.  Negative for dysuria.  Musculoskeletal: Negative.  Negative for back pain and joint pain.  Skin: Negative.  Negative for rash.  Neurological: Negative.  Negative for sensory change, focal weakness, weakness and headaches.  Psychiatric/Behavioral: Negative.  The patient is not nervous/anxious.     As per HPI. Otherwise, a complete review of systems is negative.  PAST MEDICAL HISTORY: Past Medical History:  Diagnosis Date  . Anxiety   . Depression     PAST SURGICAL HISTORY: Past Surgical History:  Procedure Laterality Date  . FLEXIBLE BRONCHOSCOPY N/A 11/11/2017   Procedure: FLEXIBLE BRONCHOSCOPY;  Surgeon: Allyne Gee, MD;  Location: [facility] ORS;  Service: Pulmonary;  Laterality: N/A;  . none    . PORTA CATH INSERTION N/A 11/29/2017   Procedure: PORTA CATH INSERTION;  Surgeon: Katha Cabal, MD;  Location: [place] [facility];  Service: Cardiovascular;  Laterality: N/A;    FAMILY HISTORY: Family History  Problem Relation Age of Onset  . Diabetes Mellitus II Mother     ADVANCED DIRECTIVES (Y/N):  N  HEALTH MAINTENANCE: Social History   Tobacco Use  . Smoking status: Current Every Day Smoker    Packs/day: 1.00    Types: Cigarettes  . Smokeless tobacco: Never Used  Substance Use Topics  . Alcohol use: Yes    Comment: alot   . Drug use: Yes     Colonoscopy:  PAP:  Bone density:  Lipid panel:  No Known Allergies  Current Outpatient Medications  Medication Sig Dispense Refill  . feeding supplement, ENSURE ENLIVE, (ENSURE ENLIVE) LIQD Take 237 mLs by mouth 3 (three) times daily between meals. 237 mL 12  . lidocaine-prilocaine (EMLA) cream Apply 1 application topically as needed. Apply small amount to port site at least 1 hour prior to it being accessed, cover with plastic wrap 30 g 1  . megestrol (MEGACE) 40 MG tablet Take 1 tablet (40 mg total) by mouth daily. 30 tablet 1  . naproxen sodium (ALEVE) 220 MG tablet Take 220 mg by mouth daily.     . NON FORMULARY     . ondansetron (ZOFRAN) 8 MG tablet Take 8 mg by mouth 2 (two) times daily.    . pantoprazole (PROTONIX) 40 MG tablet Take  1 tablet (40 mg total) by mouth daily. 90 tablet 0  . thiamine 100 MG tablet Take 1 tablet (100 mg total) by mouth daily. 30 tablet 0  . diphenhydrAMINE (BENADRYL) 25 MG tablet Take 25 mg by mouth every morning.    . prochlorperazine (COMPAZINE) 10 MG tablet Take 10 mg by mouth every 6 (six) hours as needed for nausea or vomiting.     No current facility-administered medications for this visit.     OBJECTIVE: Vitals:   02/16/18 0907  BP: 101/63    Pulse: (!) 104  Resp: 18  Temp: (!) 96.5 F (35.8 C)     Body mass index is 16.59 kg/m.    ECOG FS:0 - Asymptomatic  General: Well-developed, well-nourished, no acute distress. Eyes: Pink conjunctiva, anicteric sclera. HEENT: Normocephalic, moist mucous membranes. Lungs: Clear to auscultation bilaterally. Heart: Regular rate and rhythm. No rubs, murmurs, or gallops. Abdomen: Soft, nontender, nondistended. No organomegaly noted, normoactive bowel sounds. Musculoskeletal: No edema, cyanosis, or clubbing. Neuro: Alert, answering all questions appropriately. Cranial nerves grossly intact. Skin: No rashes or petechiae noted. Psych: Normal affect.  LAB RESULTS:  Lab Results  Component Value Date   NA 136 02/16/2018   K 3.7 02/16/2018   CL 102 02/16/2018   CO2 22 02/16/2018   GLUCOSE 144 (H) 02/16/2018   BUN 14 02/16/2018   CREATININE 0.81 02/16/2018   CALCIUM 9.7 02/16/2018   PROT 8.2 (H) 02/16/2018   ALBUMIN 3.7 02/16/2018   AST 28 02/16/2018   ALT 19 02/16/2018   ALKPHOS 66 02/16/2018   BILITOT 0.6 02/16/2018   GFRNONAA >60 02/16/2018   GFRAA >60 02/16/2018    Lab Results  Component Value Date   WBC 5.3 02/16/2018   NEUTROABS 3.7 02/16/2018   HGB 12.2 (L) 02/16/2018   HCT 36.9 (L) 02/16/2018   MCV 79.2 (L) 02/16/2018   PLT 291 02/16/2018     STUDIES: No results found.  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  PLAN:    1. 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  MRI of the brain from December 01, 2017 reviewed independently with no evidence of metastatic disease.  Patient completed his concurrent carboplatinum and Taxol along with XRT on January 05, 2018.  Proceed with cycle 2 of maintenance Imfinzi today.  Patient will retrieve treatment every 2 weeks for up to 12 months.  Return to clinic in 2 weeks for  further evaluation and consideration of cycle 3.  Will repeat PET scan prior to next infusion. 2.  Renal mass: Highly suspicious for renal cell carcinoma.  Case discussed with urology.  Will continue to monitor and consider possible nephrectomy at the conclusion of his treatments for his squamous cell carcinoma of lung.  Reimaging as above. 3.  Hyponatremia: Resolved.  Monitor. 4.  Nausea: Patient does not complain of this today.  Continue Zofran as needed. 5.  Leukopenia: Resolved. 6.  Anemia: Patient's hemoglobin has improved to 12.2.  Monitor.  Patient expressed understanding and was in agreement with this plan. He also understands that He can call clinic at any time with any questions, concerns, or complaints.   Cancer Staging Squamous cell lung cancer, right ([place]) Staging form: Lung, AJCC 8th Edition - Clinical: Stage IIIC (cT4, cN3, cM0) - Signed by Lloyd Huger, MD on 11/23/2017   Lloyd Huger, MD  02/16/2018 6:01 PM

## 2018-02-15 ENCOUNTER — Ambulatory Visit
Admission: RE | Admit: 2018-02-15 | Discharge: 2018-02-15 | Disposition: A | Discharge status: Home / Self Care | Payer: Medicare Other | Source: Ambulatory Visit | Attending: Radiation Oncology | Admitting: Radiation Oncology

## 2018-02-15 DIAGNOSIS — Z510 Encounter for antineoplastic radiation therapy: Secondary | ICD-10-CM | POA: Diagnosis not present

## 2018-02-16 ENCOUNTER — Inpatient Hospital Stay (HOSPITAL_BASED_OUTPATIENT_CLINIC_OR_DEPARTMENT_OTHER): Payer: Medicare Other | Admitting: Oncology

## 2018-02-16 ENCOUNTER — Inpatient Hospital Stay: Payer: Medicare Other

## 2018-02-16 ENCOUNTER — Other Ambulatory Visit: Payer: Self-pay

## 2018-02-16 VITALS — BP 101/63 | HR 104 | Temp 96.5°F | Resp 18 | Wt 127.5 lb

## 2018-02-16 DIAGNOSIS — Z9221 Personal history of antineoplastic chemotherapy: Secondary | ICD-10-CM

## 2018-02-16 DIAGNOSIS — Z923 Personal history of irradiation: Secondary | ICD-10-CM

## 2018-02-16 DIAGNOSIS — C3491 Malignant neoplasm of unspecified part of right bronchus or lung: Secondary | ICD-10-CM

## 2018-02-16 DIAGNOSIS — C3411 Malignant neoplasm of upper lobe, right bronchus or lung: Secondary | ICD-10-CM

## 2018-02-16 DIAGNOSIS — I10 Essential (primary) hypertension: Secondary | ICD-10-CM

## 2018-02-16 DIAGNOSIS — Z5112 Encounter for antineoplastic immunotherapy: Secondary | ICD-10-CM | POA: Diagnosis not present

## 2018-02-16 DIAGNOSIS — N2889 Other specified disorders of kidney and ureter: Secondary | ICD-10-CM

## 2018-02-16 DIAGNOSIS — F17210 Nicotine dependence, cigarettes, uncomplicated: Secondary | ICD-10-CM | POA: Diagnosis not present

## 2018-02-16 DIAGNOSIS — D649 Anemia, unspecified: Secondary | ICD-10-CM

## 2018-02-16 DIAGNOSIS — F418 Other specified anxiety disorders: Secondary | ICD-10-CM

## 2018-02-16 DIAGNOSIS — E871 Hypo-osmolality and hyponatremia: Secondary | ICD-10-CM

## 2018-02-16 DIAGNOSIS — Z79899 Other long term (current) drug therapy: Secondary | ICD-10-CM

## 2018-02-16 LAB — CBC WITH DIFFERENTIAL/PLATELET
Basophils Absolute: 0 10*3/uL (ref 0–0.1)
Basophils Relative: 1 %
EOS ABS: 0.3 10*3/uL (ref 0–0.7)
Eosinophils Relative: 5 %
HEMATOCRIT: 36.9 % — AB (ref 40.0–52.0)
HEMOGLOBIN: 12.2 g/dL — AB (ref 13.0–18.0)
LYMPHS ABS: 0.6 10*3/uL — AB (ref 1.0–3.6)
LYMPHS PCT: 11 %
MCH: 26.3 pg (ref 26.0–34.0)
MCHC: 33.2 g/dL (ref 32.0–36.0)
MCV: 79.2 fL — AB (ref 80.0–100.0)
Monocytes Absolute: 0.7 10*3/uL (ref 0.2–1.0)
Monocytes Relative: 13 %
NEUTROS ABS: 3.7 10*3/uL (ref 1.4–6.5)
NEUTROS PCT: 70 %
Platelets: 291 10*3/uL (ref 150–440)
RBC: 4.66 MIL/uL (ref 4.40–5.90)
RDW: 18.3 % — ABNORMAL HIGH (ref 11.5–14.5)
WBC: 5.3 10*3/uL (ref 3.8–10.6)

## 2018-02-16 LAB — COMPREHENSIVE METABOLIC PANEL
ALT: 19 U/L (ref 0–44)
AST: 28 U/L (ref 15–41)
Albumin: 3.7 g/dL (ref 3.5–5.0)
Alkaline Phosphatase: 66 U/L (ref 38–126)
Anion gap: 12 (ref 5–15)
BUN: 14 mg/dL (ref 6–20)
CHLORIDE: 102 mmol/L (ref 98–111)
CO2: 22 mmol/L (ref 22–32)
CREATININE: 0.81 mg/dL (ref 0.61–1.24)
Calcium: 9.7 mg/dL (ref 8.9–10.3)
GFR calc non Af Amer: >60 mL/min (ref >60)
Glucose, Bld: 144 mg/dL — ABNORMAL HIGH (ref 70–99)
POTASSIUM: 3.7 mmol/L (ref 3.5–5.1)
SODIUM: 136 mmol/L (ref 135–145)
Total Bilirubin: 0.6 mg/dL (ref 0.3–1.2)
Total Protein: 8.2 g/dL — ABNORMAL HIGH (ref 6.5–8.1)

## 2018-02-16 MED ORDER — SODIUM CHLORIDE 0.9 % IV SOLN
Freq: Once | INTRAVENOUS | Status: AC
  Administered 2018-02-16: 11:00:00 via INTRAVENOUS
  Filled 2018-02-16: qty 250

## 2018-02-16 MED ORDER — HEPARIN SOD (PORK) LOCK FLUSH 100 UNIT/ML IV SOLN
500.0000 [IU] | Freq: Once | INTRAVENOUS | Status: AC
  Administered 2018-02-16: 500 [IU] via INTRAVENOUS

## 2018-02-16 MED ORDER — HEPARIN SOD (PORK) LOCK FLUSH 100 UNIT/ML IV SOLN: 500.0000 [IU] | Freq: Once | INTRAVENOUS | Status: DC | PRN

## 2018-02-16 MED ORDER — SODIUM CHLORIDE 0.9 % IV SOLN
620.0000 mg | Freq: Once | INTRAVENOUS | Status: AC
  Administered 2018-02-16: 620 mg via INTRAVENOUS
  Filled 2018-02-16: qty 10

## 2018-02-16 MED ORDER — SODIUM CHLORIDE 0.9% FLUSH
10.0000 mL | Freq: Once | INTRAVENOUS | Status: AC
  Administered 2018-02-16: 10 mL via INTRAVENOUS
  Filled 2018-02-16: qty 10

## 2018-02-16 NOTE — Progress Notes (Signed)
Overall stated doing well- except pain on swallowing. Stated plans to pick up magic wash today. Affect bright. Ensure given.

## 2018-02-17 LAB — THYROID PANEL WITH TSH
FREE THYROXINE INDEX: 2.9 (ref 1.2–4.9)
T3 UPTAKE RATIO: 21 % — AB (ref 24–39)
T4, Total: 13.8 ug/dL — ABNORMAL HIGH (ref 4.5–12.0)
TSH: 1.43 u[IU]/mL (ref 0.450–4.500)

## 2018-02-27 NOTE — Progress Notes (Signed)
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  INTERVAL HISTORY: Patient returns to clinic today for further evaluation, discussion of his PET scan results, and consideration of cycle 3 of maintenance Imfinzi.  He is tolerating his treatments well without significant side effects.  He continues to have a chronic cough, but also admits to continued tobacco use.  He otherwise feels well and is asymptomatic. He has a good appetite and his weight has remained stable. He has no neurologic complaints.  He denies any recent fevers or illnesses.  He denies any chest pain, shortness of breath, or hemoptysis.  He has no nausea, vomiting, constipation, or diarrhea.  He has no urinary complaints.  Patient offers no further specific complaints today.  REVIEW OF SYSTEMS:   Review of Systems  Constitutional: Negative.  Negative for fever, malaise/fatigue and weight loss.  Respiratory: Positive for cough. Negative for hemoptysis and shortness of breath.   Cardiovascular: Negative.  Negative for chest pain and leg swelling.  Gastrointestinal: Negative.  Negative for abdominal pain, constipation and nausea.  Genitourinary: Negative.  Negative for dysuria.  Musculoskeletal: Negative.  Negative for back pain and joint pain.  Skin: Negative.  Negative for rash.  Neurological: Negative.  Negative for sensory change, focal weakness, weakness and headaches.  Psychiatric/Behavioral: Negative.  The patient is not nervous/anxious.     As per HPI. Otherwise, a complete review of systems is negative.  PAST MEDICAL HISTORY: Past Medical History:  Diagnosis Date  . Anxiety   . Depression     PAST  SURGICAL HISTORY: Past Surgical History:  Procedure Laterality Date  . FLEXIBLE BRONCHOSCOPY N/A 11/11/2017   Procedure: FLEXIBLE BRONCHOSCOPY;  Surgeon: Allyne Gee, MD;  Location: [facility]S;  Service: Pulmonary;  Laterality: N/A;  . none    . PORTA CATH INSERTION N/A 11/29/2017   Procedure: PORTA CATH INSERTION;  Surgeon: Katha Cabal, MD;  Location: [place] [facility];  Service: Cardiovascular;  Laterality: N/A;    FAMILY HISTORY: Family History  Problem Relation Age of Onset  . Diabetes Mellitus II Mother     ADVANCED DIRECTIVES (Y/N):  N  HEALTH MAINTENANCE: Social History   Tobacco Use  . Smoking status: Current Every Day Smoker    Packs/day: 1.00    Types: Cigarettes  . Smokeless tobacco: Never Used  Substance Use Topics  . Alcohol use: Yes    Comment: alot   . Drug use: Yes     Colonoscopy:  PAP:  Bone density:  Lipid panel:  No Known Allergies  Current Outpatient Medications  Medication Sig Dispense Refill  . feeding supplement, ENSURE ENLIVE, (ENSURE ENLIVE) LIQD Take 237 mLs by mouth 3 (three) times daily between meals. 237 mL 12  . lidocaine-prilocaine (EMLA) cream Apply 1 application topically as needed. Apply small amount to port site at least 1 hour prior to it being accessed, cover with plastic wrap 30 g 1  . megestrol (MEGACE) 40 MG tablet Take 1 tablet (40 mg total) by mouth daily. 30 tablet 1  . naproxen sodium (ALEVE) 220 MG tablet Take 220 mg by mouth daily.     . NON FORMULARY     . pantoprazole (PROTONIX) 40 MG tablet Take  1 tablet (40 mg total) by mouth daily. 90 tablet 0  . thiamine 100 MG tablet Take 1 tablet (100 mg total) by mouth daily. 30 tablet 0  . diphenhydrAMINE (BENADRYL) 25 MG tablet Take 25 mg by mouth every morning.    . ondansetron (ZOFRAN) 8 MG tablet Take 8 mg by mouth 2 (two) times daily.    . prochlorperazine (COMPAZINE) 10 MG tablet Take 10 mg by mouth every 6 (six) hours as needed for nausea or vomiting.     No  current facility-administered medications for this visit.     OBJECTIVE: Vitals:   03/02/18 0848  BP: 117/72  Pulse: (!) 104  Resp: 18  Temp: (!) 97.4 F (36.3 C)     Body mass index is 16.59 kg/m.    ECOG FS:0 - Asymptomatic  General: Well-developed, well-nourished, no acute distress. Eyes: Pink conjunctiva, anicteric sclera. HEENT: Normocephalic, moist mucous membranes. Lungs: Clear to auscultation bilaterally. Heart: Regular rate and rhythm. No rubs, murmurs, or gallops. Abdomen: Soft, nontender, nondistended. No organomegaly noted, normoactive bowel sounds. Musculoskeletal: No edema, cyanosis, or clubbing. Neuro: Alert, answering all questions appropriately. Cranial nerves grossly intact. Skin: No rashes or petechiae noted. Psych: Normal affect.  LAB RESULTS:  Lab Results  Component Value Date   NA 136 03/02/2018   K 3.5 03/02/2018   CL 102 03/02/2018   CO2 23 03/02/2018   GLUCOSE 180 (H) 03/02/2018   BUN 12 03/02/2018   CREATININE 0.67 03/02/2018   CALCIUM 9.4 03/02/2018   PROT 7.9 03/02/2018   ALBUMIN 3.3 (L) 03/02/2018   AST 25 03/02/2018   ALT 16 03/02/2018   ALKPHOS 62 03/02/2018   BILITOT 0.4 03/02/2018   GFRNONAA >60 03/02/2018   GFRAA >60 03/02/2018    Lab Results  Component Value Date   WBC 9.0 03/02/2018   NEUTROABS 7.5 (H) 03/02/2018   HGB 12.1 (L) 03/02/2018   HCT 36.6 (L) 03/02/2018   MCV 79.6 (L) 03/02/2018   PLT 288 03/02/2018     STUDIES: Nm Pet Image Restag (ps) Skull Base To Thigh  Addendum Date: 02/28/2018   ADDENDUM REPORT: 02/28/2018 14:27 ADDENDUM: The original report was by Dr. Van Clines. The following addendum is by Dr. Van Clines: THERE IS [place] TO BE ADDED TO THE PRIOR LIST THAT I DICTATED SEVERAL MINUTES AGO: There are scattered new bilateral pulmonary nodules which are mildly hypermetabolic, suspicious for small metastatic lesions, although conceivably infectious given the  otherwise improving pattern in the rest of the chest. Several of these demonstrate some internal cavitation. Maximum SUV of these nodules is about 3.2. Electronically Signed   By: Van Clines M.D.   On: 02/28/2018 14:27   Result Date: 02/28/2018 CLINICAL DATA:  Subsequent treatment strategy for squamous cell carcinoma of the lung. EXAM: NUCLEAR MEDICINE PET SKULL BASE TO THIGH TECHNIQUE: 7.2 mCi F-18 FDG was injected intravenously. Full-ring PET imaging was performed from the skull base to thigh after the radiotracer. CT data was obtained and used for attenuation correction and anatomic localization. Fasting blood glucose: 92 mg/dl COMPARISON:  PET-CT dated 11/16/2016 FINDINGS: Mediastinal blood pool activity: SUV max 1.8 NECK: No significant abnormal hypermetabolic activity in this region. Incidental CT findings: Mild chronic left maxillary and sphenoid sinusitis. CHEST: Marked reduction in the overall mediastinal and paramediastinal mass compared to the prior exam, with the fact it portion of the right upper lobe which is mostly atelectatic having a maximum SUV of 4.4, previously 20.3. There is previously  local extension of tumor in the paratracheal, pretracheal, and subcarinal regions. Currently mild focal residual precarinal activity is observed with a maximum SUV of 5.6. Focal small amount of subcarinal activity adjacent to a calcified lymph node with maximum SUV 5.1. There are new scattered bilateral pulmonary nodules. A 1.5 by 1.1 cm right lower lobe nodule on image 108/4 has a maximum SUV of 3.2. A new 0.9 by 0.7 cm left upper lobe nodule on image 86/4 has a maximum SUV of 2.1. Some of these nodules are cavitary. Some have enlarged compared to prior. As before, there is collapse of the right upper lobe with central cavitary process. Incidental CT findings: Left Port-A-Cath tip: Cavoatrial junction. Coronary, aortic arch, and branch vessel atherosclerotic vascular disease. ABDOMEN/PELVIS:  Hypermetabolic 3.2 cm mass anteriorly in the right kidney. This is difficult to confidently measure separate from activity in the renal collecting system but could have an activity as high as 12.5 (previously, 15.0). Incidental activity in the urethra. Incidental CT findings: Aortoiliac atherosclerotic vascular disease. Prominent stool throughout the colon favors constipation. SKELETON: No significant abnormal hypermetabolic activity in this region. Incidental CT findings: none IMPRESSION: 1. Overall marked reduction in mediastinal and paramediastinal mass on the right. Continued right upper lobe atelectasis with cavitary process and airspace opacity with low-grade residual activity, maximum SUV 4.4 (formerly 20.3). 2. There several small hypermetabolic foci in the paratracheal and subcarinal regions potentially reflecting residual hypermetabolic tumor/adenopathy, maximum SUV 5.6. 3. Continued 3.2 cm hypermetabolic mass anteriorly in the right kidney, potentially a metastatic lesion or a primary renal cell carcinoma. This currently has a maximum SUV of 12.5, previously 15.0. 4. Other imaging findings of potential clinical significance: Chronic paranasal sinusitis. Aortic Atherosclerosis (ICD10-I70.0). Prominent stool throughout the colon favors constipation. Coronary atherosclerosis. Electronically Signed: By: Van Clines M.D. 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  PLAN:    1. 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  MRI of the brain from December 01, 2017 reviewed independently with no evidence of metastatic disease.  Patient completed his concurrent carboplatinum and Taxol along with XRT on January 05, 2018 and initiated maintenance Imfinzi on February 02, 2018.  Proceed with cycle 3 of maintenance treatment today.  Continue treatment every 2 weeks for up to 12 months.  Return to clinic in 2 weeks for treatment only and then in 4 weeks for further evaluation and consideration of cycle 5 of Imfinzi.   2.  Renal mass: Highly suspicious for renal cell carcinoma.  Case discussed with urology.  PET scan results as above.  Now the patient has completed chemotherapy and XRT and is on maintenance treatment, will consider referral back to urology for further evaluation and possible nephrectomy.   3.  Hyponatremia: Resolved.  Monitor. 4.  Nausea: Patient does not complain of this today.  Continue Zofran as needed. 5.  Cough: Continue OTC treatments. 6.  Persistent tobacco use: Encouraged quitting and patient was recommended strategies to do so.  He expressed understanding that patients who continue to smoke have a high risk of recurrence.  Patient expressed understanding and was in agreement with this plan. He also understands that He can call clinic at any time with any questions, concerns, or complaints.   Cancer Staging Squamous cell lung cancer, right (  [place]) Staging form: Lung, AJCC 8th Edition - Clinical: Stage IIIC (cT4, cN3, cM0) - Signed by Lloyd Huger, MD on 11/23/2017   Lloyd Huger, MD   03/04/2018 7:02 AM

## 2018-02-28 ENCOUNTER — Encounter
Admission: RE | Admit: 2018-02-28 | Discharge: 2018-02-28 | Disposition: A | Discharge status: Home / Self Care | Payer: Medicare Other | Source: Ambulatory Visit | Attending: Oncology | Admitting: Oncology

## 2018-02-28 DIAGNOSIS — C3491 Malignant neoplasm of unspecified part of right bronchus or lung: Secondary | ICD-10-CM | POA: Diagnosis present

## 2018-02-28 LAB — GLUCOSE, CAPILLARY: Glucose-Capillary: 92 mg/dL (ref 70–99)

## 2018-02-28 MED ORDER — FLUDEOXYGLUCOSE F - 18 (FDG) INJECTION
7.2100 | Freq: Once | INTRAVENOUS | Status: AC | PRN
  Administered 2018-02-28: 7.21 via INTRAVENOUS

## 2018-03-02 ENCOUNTER — Inpatient Hospital Stay: Payer: Medicare Other

## 2018-03-02 ENCOUNTER — Inpatient Hospital Stay (HOSPITAL_BASED_OUTPATIENT_CLINIC_OR_DEPARTMENT_OTHER): Payer: Medicare Other | Admitting: Oncology

## 2018-03-02 ENCOUNTER — Inpatient Hospital Stay: Payer: Medicare Other | Attending: Oncology

## 2018-03-02 ENCOUNTER — Other Ambulatory Visit: Payer: Self-pay

## 2018-03-02 VITALS — BP 117/72 | HR 104 | Temp 97.4°F | Resp 18 | Wt 127.5 lb

## 2018-03-02 DIAGNOSIS — F17210 Nicotine dependence, cigarettes, uncomplicated: Secondary | ICD-10-CM | POA: Insufficient documentation

## 2018-03-02 DIAGNOSIS — Z923 Personal history of irradiation: Secondary | ICD-10-CM

## 2018-03-02 DIAGNOSIS — C3491 Malignant neoplasm of unspecified part of right bronchus or lung: Secondary | ICD-10-CM

## 2018-03-02 DIAGNOSIS — Z9221 Personal history of antineoplastic chemotherapy: Secondary | ICD-10-CM

## 2018-03-02 DIAGNOSIS — I700 Atherosclerosis of aorta: Secondary | ICD-10-CM

## 2018-03-02 DIAGNOSIS — K5900 Constipation, unspecified: Secondary | ICD-10-CM | POA: Diagnosis not present

## 2018-03-02 DIAGNOSIS — Z5112 Encounter for antineoplastic immunotherapy: Secondary | ICD-10-CM | POA: Insufficient documentation

## 2018-03-02 DIAGNOSIS — F418 Other specified anxiety disorders: Secondary | ICD-10-CM | POA: Diagnosis not present

## 2018-03-02 DIAGNOSIS — J323 Chronic sphenoidal sinusitis: Secondary | ICD-10-CM | POA: Insufficient documentation

## 2018-03-02 DIAGNOSIS — I2510 Atherosclerotic heart disease of native coronary artery without angina pectoris: Secondary | ICD-10-CM | POA: Diagnosis not present

## 2018-03-02 DIAGNOSIS — C3411 Malignant neoplasm of upper lobe, right bronchus or lung: Secondary | ICD-10-CM

## 2018-03-02 DIAGNOSIS — N2889 Other specified disorders of kidney and ureter: Secondary | ICD-10-CM | POA: Diagnosis not present

## 2018-03-02 LAB — COMPREHENSIVE METABOLIC PANEL
ALBUMIN: 3.3 g/dL — AB (ref 3.5–5.0)
ALT: 16 U/L (ref 0–44)
ANION GAP: 11 (ref 5–15)
AST: 25 U/L (ref 15–41)
Alkaline Phosphatase: 62 U/L (ref 38–126)
BUN: 12 mg/dL (ref 6–20)
CALCIUM: 9.4 mg/dL (ref 8.9–10.3)
CO2: 23 mmol/L (ref 22–32)
Chloride: 102 mmol/L (ref 98–111)
Creatinine, Ser: 0.67 mg/dL (ref 0.61–1.24)
GFR calc non Af Amer: >60 mL/min (ref >60)
Glucose, Bld: 180 mg/dL — ABNORMAL HIGH (ref 70–99)
Potassium: 3.5 mmol/L (ref 3.5–5.1)
Sodium: 136 mmol/L (ref 135–145)
TOTAL PROTEIN: 7.9 g/dL (ref 6.5–8.1)
Total Bilirubin: 0.4 mg/dL (ref 0.3–1.2)

## 2018-03-02 LAB — CBC WITH DIFFERENTIAL/PLATELET
BASOS ABS: 0 10*3/uL (ref 0–0.1)
Basophils Relative: 0 %
EOS ABS: 0.3 10*3/uL (ref 0–0.7)
EOS PCT: 3 %
HCT: 36.6 % — ABNORMAL LOW (ref 40.0–52.0)
Hemoglobin: 12.1 g/dL — ABNORMAL LOW (ref 13.0–18.0)
Lymphocytes Relative: 7 %
Lymphs Abs: 0.6 10*3/uL — ABNORMAL LOW (ref 1.0–3.6)
MCH: 26.2 pg (ref 26.0–34.0)
MCHC: 32.9 g/dL (ref 32.0–36.0)
MCV: 79.6 fL — AB (ref 80.0–100.0)
Monocytes Absolute: 0.6 10*3/uL (ref 0.2–1.0)
Monocytes Relative: 7 %
NEUTROS ABS: 7.5 10*3/uL — AB (ref 1.4–6.5)
NEUTROS PCT: 83 %
PLATELETS: 288 10*3/uL (ref 150–440)
RBC: 4.6 MIL/uL (ref 4.40–5.90)
RDW: 16.7 % — ABNORMAL HIGH (ref 11.5–14.5)
WBC: 9 10*3/uL (ref 3.8–10.6)

## 2018-03-02 MED ORDER — SODIUM CHLORIDE 0.9 % IV SOLN
620.0000 mg | Freq: Once | INTRAVENOUS | Status: AC
  Administered 2018-03-02: 620 mg via INTRAVENOUS
  Filled 2018-03-02: qty 10

## 2018-03-02 MED ORDER — HEPARIN SOD (PORK) LOCK FLUSH 100 UNIT/ML IV SOLN: 500.0000 [IU] | Freq: Once | INTRAVENOUS | Status: DC | PRN

## 2018-03-02 MED ORDER — SODIUM CHLORIDE 0.9 % IV SOLN
Freq: Once | INTRAVENOUS | Status: AC
  Administered 2018-03-02: 10:00:00 via INTRAVENOUS
  Filled 2018-03-02: qty 250

## 2018-03-02 MED ORDER — HEPARIN SOD (PORK) LOCK FLUSH 100 UNIT/ML IV SOLN
500.0000 [IU] | Freq: Once | INTRAVENOUS | Status: AC
  Administered 2018-03-02: 500 [IU] via INTRAVENOUS
  Filled 2018-03-02: qty 5

## 2018-03-02 MED ORDER — SODIUM CHLORIDE 0.9% FLUSH
10.0000 mL | Freq: Once | INTRAVENOUS | Status: AC
  Administered 2018-03-02: 10 mL via INTRAVENOUS
  Filled 2018-03-02: qty 10

## 2018-03-02 NOTE — Progress Notes (Signed)
Nutrition Follow-up:  Patient with lung cancer followed by Dr. Grayland Ormond.  Patient currently on imfinzi.    Met with patient during infusion today.  Reports appetite is so-so.  Reports that he has been drinking 2-3 ensures per day.  Reports has tried to add more condiments to foods (butter, mayo, etc).  Reports he drank to ensure this am before coming to clinic.  Reports for supper last night ate meatloaf, cabbage and drank ensure. For lunch  Yesterday ate ham sandwich with extra mayo.  Reports no nutrition impact symptoms.     Medications: megace  Labs: glucose 180  Anthropometrics:   Weight 127 lb 8 oz today slight decrease from 129 lb on 8/8.     NUTRITION DIAGNOSIS: Malnutrition continues   MALNUTRITION DIAGNOSIS: severe malnutrition continues   INTERVENTION:  Encouraged patient to continue adding high calorie condiments to foods and to choose high calorie, high protein foods. We reviewed those today.   Encouraged patient to continue to use ensure for added nutrition. Additional case of ensure enlive given today with coupons   MONITORING, EVALUATION, GOAL: weight trends, intake   NEXT VISIT: October 3 during infusion  Deeanne Deininger B. Zenia Resides, [place], [place] Registered Dietitian [phone] (pager)

## 2018-03-02 NOTE — Progress Notes (Signed)
Pt here for follow up. Intermittent loose rattely cough. Productive per pt ( 3-4 x per day)- clear phlegm he stated. Per pt " only smoking about 4 cigarettes per d)   Carma Leaven -dietician stated she will see pt in infusion suite and provide care of ensure. Pt aware.

## 2018-03-03 LAB — THYROID PANEL WITH TSH
FREE THYROXINE INDEX: 3.3 (ref 1.2–4.9)
T3 UPTAKE RATIO: 23 % — AB (ref 24–39)
T4 TOTAL: 14.5 ug/dL — AB (ref 4.5–12.0)
TSH: 1.31 u[IU]/mL (ref 0.450–4.500)

## 2018-03-07 ENCOUNTER — Other Ambulatory Visit: Payer: Self-pay

## 2018-03-07 ENCOUNTER — Emergency Department: Payer: Medicare Other

## 2018-03-07 ENCOUNTER — Inpatient Hospital Stay
Admission: EM | Admit: 2018-03-07 | Discharge: 2018-03-28 | DRG: 208 | Disposition: E | Payer: Medicare Other | Attending: Internal Medicine | Admitting: Internal Medicine

## 2018-03-07 ENCOUNTER — Encounter: Payer: Self-pay | Admitting: Radiology

## 2018-03-07 DIAGNOSIS — K920 Hematemesis: Secondary | ICD-10-CM | POA: Diagnosis present

## 2018-03-07 DIAGNOSIS — J9601 Acute respiratory failure with hypoxia: Secondary | ICD-10-CM

## 2018-03-07 DIAGNOSIS — Z833 Family history of diabetes mellitus: Secondary | ICD-10-CM

## 2018-03-07 DIAGNOSIS — C3411 Malignant neoplasm of upper lobe, right bronchus or lung: Secondary | ICD-10-CM | POA: Diagnosis not present

## 2018-03-07 DIAGNOSIS — F329 Major depressive disorder, single episode, unspecified: Secondary | ICD-10-CM | POA: Diagnosis present

## 2018-03-07 DIAGNOSIS — E43 Unspecified severe protein-calorie malnutrition: Secondary | ICD-10-CM | POA: Diagnosis present

## 2018-03-07 DIAGNOSIS — I493 Ventricular premature depolarization: Secondary | ICD-10-CM | POA: Diagnosis present

## 2018-03-07 DIAGNOSIS — Z66 Do not resuscitate: Secondary | ICD-10-CM | POA: Diagnosis present

## 2018-03-07 DIAGNOSIS — J860 Pyothorax with fistula: Secondary | ICD-10-CM | POA: Diagnosis present

## 2018-03-07 DIAGNOSIS — J9621 Acute and chronic respiratory failure with hypoxia: Secondary | ICD-10-CM | POA: Diagnosis present

## 2018-03-07 DIAGNOSIS — F419 Anxiety disorder, unspecified: Secondary | ICD-10-CM | POA: Diagnosis present

## 2018-03-07 DIAGNOSIS — C3491 Malignant neoplasm of unspecified part of right bronchus or lung: Secondary | ICD-10-CM | POA: Diagnosis present

## 2018-03-07 DIAGNOSIS — J439 Emphysema, unspecified: Secondary | ICD-10-CM | POA: Diagnosis present

## 2018-03-07 DIAGNOSIS — I700 Atherosclerosis of aorta: Secondary | ICD-10-CM | POA: Diagnosis present

## 2018-03-07 DIAGNOSIS — F17210 Nicotine dependence, cigarettes, uncomplicated: Secondary | ICD-10-CM | POA: Diagnosis present

## 2018-03-07 DIAGNOSIS — R042 Hemoptysis: Secondary | ICD-10-CM | POA: Diagnosis present

## 2018-03-07 DIAGNOSIS — Z681 Body mass index (BMI) 19 or less, adult: Secondary | ICD-10-CM

## 2018-03-07 DIAGNOSIS — N2889 Other specified disorders of kidney and ureter: Secondary | ICD-10-CM | POA: Diagnosis present

## 2018-03-07 DIAGNOSIS — I469 Cardiac arrest, cause unspecified: Secondary | ICD-10-CM | POA: Diagnosis not present

## 2018-03-07 HISTORY — DX: Malignant (primary) neoplasm, unspecified: C80.1

## 2018-03-07 LAB — CBC
HEMATOCRIT: 35.5 % — AB (ref 40.0–52.0)
HEMOGLOBIN: 11.5 g/dL — AB (ref 13.0–18.0)
MCH: 26.2 pg (ref 26.0–34.0)
MCHC: 32.4 g/dL (ref 32.0–36.0)
MCV: 80.8 fL (ref 80.0–100.0)
Platelets: 299 10*3/uL (ref 150–440)
RBC: 4.39 MIL/uL — ABNORMAL LOW (ref 4.40–5.90)
RDW: 16.1 % — AB (ref 11.5–14.5)
WBC: 10.6 10*3/uL (ref 3.8–10.6)

## 2018-03-07 LAB — BASIC METABOLIC PANEL
Anion gap: 14 (ref 5–15)
BUN: 13 mg/dL (ref 6–20)
CO2: 22 mmol/L (ref 22–32)
Calcium: 9 mg/dL (ref 8.9–10.3)
Chloride: 97 mmol/L — ABNORMAL LOW (ref 98–111)
Creatinine, Ser: 0.98 mg/dL (ref 0.61–1.24)
GFR calc Af Amer: >60 mL/min (ref >60)
GFR calc non Af Amer: >60 mL/min (ref >60)
Glucose, Bld: 228 mg/dL — ABNORMAL HIGH (ref 70–99)
POTASSIUM: 3.9 mmol/L (ref 3.5–5.1)
Sodium: 133 mmol/L — ABNORMAL LOW (ref 135–145)

## 2018-03-07 LAB — TYPE AND SCREEN
ABO/RH(D): O POS
ANTIBODY SCREEN: NEGATIVE

## 2018-03-07 LAB — TROPONIN I: Troponin I: <0.03 ng/mL (ref <0.03)

## 2018-03-07 MED ORDER — ROCURONIUM BROMIDE 50 MG/5ML IV SOLN
INTRAVENOUS | Status: AC | PRN
  Administered 2018-03-07: 100 mg via INTRAVENOUS

## 2018-03-07 MED ORDER — FENTANYL CITRATE (PF) 100 MCG/2ML IJ SOLN
INTRAMUSCULAR | Status: AC | PRN
  Administered 2018-03-07: 100 ug via INTRAVENOUS

## 2018-03-07 MED ORDER — FENTANYL 2500MCG IN NS 250ML (10MCG/ML) PREMIX INFUSION
0.0000 ug/h | INTRAVENOUS | Status: DC
  Administered 2018-03-08: 25 ug/h via INTRAVENOUS
  Filled 2018-03-07 (×2): qty 250

## 2018-03-07 MED ORDER — KETAMINE HCL 50 MG/ML IJ SOLN
INTRAMUSCULAR | Status: AC | PRN
  Administered 2018-03-07: 150 mg via INTRAVENOUS

## 2018-03-07 MED ORDER — IOHEXOL 350 MG/ML SOLN
75.0000 mL | Freq: Once | INTRAVENOUS | Status: AC | PRN
  Administered 2018-03-07: 75 mL via INTRAVENOUS

## 2018-03-07 MED ORDER — SODIUM CHLORIDE 0.9 % IV SOLN
0.5000 mg/h | INTRAVENOUS | Status: DC
  Administered 2018-03-07: 0.5 mg/h via INTRAVENOUS
  Filled 2018-03-07: qty 10

## 2018-03-07 NOTE — Consult Note (Signed)
Name: Wayne Chapman MRN: [id number] DOB: 02-07-1956    ADMISSION DATE:  03/09/2018 CONSULTATION DATE: 03/12/2018  REFERRING MD : Dr. 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  CTA revealed multifocal pneumonia vs. alveolar hemorrhage   SIGNIFICANT EVENTS/STUDIES:  09/10-Pt admitted to ICU mechanically intubated  09/10-CT Angio Chest revealed no evidence of pulmonary embolism. Chronic posttherapy changes at the RIGHT upper lobe including a large cavitary focus with chronic pleural thickening/effusion unchanged. Multiple new patchy areas of airspace opacification throughout BILATERAL lower lobes and LEFT upper lobe which could represent multifocal pneumonia or alveolar hemorrhage. New endobronchial material especially RIGHT lower lobe could represent blood, mucus or infection. Multiple pulmonary nodules, largest 16 x 12 mm RIGHT lower lobe, little changed. Aortic Atherosclerosis (ICD10-I70.0) and Emphysema (ICD10-J43.9).  HISTORY OF PRESENT ILLNESS:   This is a 62 yo male with PMH of Depression, Anxiety, 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  He presented to [facility] ER from home via EMS on 09/10 with c/o shortness of breath and coughing/vomiting bright red blood with clots symptoms occurred 1 hour prior to ER arrival.  Upon EMS arrival at pts home his O2 sats were 55% on RA, therefore he was placed on supplemental O2. EMS also reported pt had a container filled with 150 to 200 ml's of blood, he does not take anticoagulants.  In the ER pt tachypneic with some hemoptysis, due to concern of ability  to protect airway he required mechanical intubation. CT Angio Chest ordered revealing multifocal pneumonia vs. alveolar hemorrhage, multiple pulmonary nodules, and negative for pulmonary embolism.  Lab results revealed hemoglobin 11.5 and hematocrit 35.5. He was subsequently admitted to ICU by hospitalist team for further workup and treatment PCCM consulted for vent management.   PAST MEDICAL HISTORY :   has a past medical history of Anxiety and Depression.  has a past surgical history that includes none; Flexible bronchoscopy (N/A, 11/11/2017); and PORTA CATH INSERTION (N/A, 11/29/2017). Prior to Admission medications   Medication Sig Start Date End Date Taking? Authorizing Provider  Diphenhyd-Hydrocort-Nystatin (FIRST-DUKES MOUTHWASH MT) Swish and swallow 5-10 mLs 4 (four) times daily as needed. 02/28/18  Yes [provider]  megestrol (MEGACE) 40 MG tablet Take 1 tablet (40 mg total) by mouth daily. 12/28/17  Yes Lloyd Huger, MD  ondansetron (ZOFRAN) 8 MG tablet Take 8 mg by mouth 2 (two) times daily as needed for refractory nausea / vomiting.    Yes [provider]  pantoprazole (PROTONIX) 40 MG tablet Take 1 tablet (40 mg total) by mouth daily. 01/09/18  Yes Verlon Au, NP  prochlorperazine (COMPAZINE) 10 MG tablet Take 10 mg by mouth every 6 (six) hours as needed for nausea or vomiting.   Yes [provider]  feeding supplement, ENSURE ENLIVE, (ENSURE ENLIVE) LIQD Take 237 mLs by mouth 3 (three) times daily between meals. Patient not taking: Reported on 03/19/2018 11/13/17   Epifanio Lesches, MD  lidocaine-prilocaine (EMLA) cream Apply 1 application topically as needed. Apply small amount to port site at least 1 hour prior to it being accessed, cover with plastic wrap Patient not taking: Reported on 03/06/2018 01/26/18   Delight Hoh  J, MD  thiamine 100 MG tablet Take 1 tablet (100 mg total) by mouth daily. Patient not taking: Reported on 03/18/2018 11/13/17    Epifanio Lesches, MD   No Known Allergies  FAMILY HISTORY:  family history includes Diabetes Mellitus II in his mother. SOCIAL HISTORY:  reports that he has been smoking cigarettes. He has been smoking about 1.00 pack per day. He has never used smokeless tobacco. He reports that he drinks alcohol. He reports that he has current or past drug history.  REVIEW OF SYSTEMS:   Unable to assess pt intubated  SUBJECTIVE:  Unable to assess pt intubated   VITAL SIGNS: Temp:  [99.1 F (37.3 C)] 99.1 F (37.3 C) (09/10 2300) Pulse Rate:  [114] 114 (09/10 2300) Resp:  [25] 25 (09/10 2300) BP: (132-138)/(87-91) 138/87 (09/10 2300) SpO2:  [55 %-100 %] 96 % (09/10 2300)  PHYSICAL EXAMINATION: General: chronically ill appearing cachetic male resting in bed, NAD mechanically intubated  Neuro: sedated, follows commands, pupils pinpoint sluggish  HEENT: JVD present  Cardiovascular: sinus tach, no R/G Lungs: diffuse rhonchi with crackles throughout, even, non labored  Abdomen: +BS x4, soft, non distended  Musculoskeletal: cachetic, no edema  Skin: intact no rashes or lesions   Recent Labs  Lab 03/02/18 0831 03/26/2018 2226  NA 136 133*  K 3.5 3.9  CL 102 97*  CO2 23 22  BUN 12 13  CREATININE 0.67 0.98  GLUCOSE 180* 228*   Recent Labs  Lab 03/02/18 0831 03/06/2018 2226  HGB 12.1* 11.5*  HCT 36.6* 35.5*  WBC 9.0 10.6  PLT 288 299   Dg Chest Portable 1 View  Result Date: 03/22/2018 CLINICAL DATA:  ETT placed EXAM: PORTABLE CHEST 1 VIEW COMPARISON:  03/02/2018 FINDINGS: Placement of endotracheal tube, the tip projects over superior aspect of T5. Left-sided central venous catheter with tip over the distal SVC. Right upper lobe opacity with cystic lucency. Esophageal tube tip below the diaphragm but non included on the image. Stable cardiomediastinal silhouette with aortic atherosclerosis. IMPRESSION: 1. Placement of endotracheal tube, tip about 2 cm superior to carina but difficult  visualization of the carina. 2. Right upper lobe lung mass as before. Electronically Signed   By: Donavan Foil M.D.   On: 03/16/2018 23:09   Dg Chest Portable 1 View  Result Date: 03/22/2018 CLINICAL DATA:  Lung cancer, coughing up blood EXAM: PORTABLE CHEST 1 VIEW COMPARISON:  PET-CT 02/28/2018, radiograph 11/09/2017 FINDINGS: Left-sided central venous port tip over the cavoatrial junction. Right upper lobe opacity with cystic changes corresponding to known lung mass. Normal heart size. No pneumothorax. IMPRESSION: No acute opacity. Right upper lobe opacity with areas of lucency corresponding to known lung mass in the region. Electronically Signed   By: Donavan Foil M.D.   On: 03/06/2018 23:07    ASSESSMENT / PLAN:  Acute on chronic hypoxic respiratory failure secondary to multifocal pneumonia vs. alveolar hemorrhage resulting in aspiration  Stage IIIc squamous cell carcinoma of RUL  Mechanical intubation  Hx: Current everyday smoker  Full vent support-vent settings reviewed and established  SBT once all parameters met  VAP bundle implemented  Will start zosyn  Trend wbc and monitor fever curve Trend PCT  Respiratory culture pending  Prn bronchodilator therapy  Oncology consulted appreciate input  Will need smoking cessation counseling once extubated   Hemoptysis  Trend CBC  Monitor for s/sx of bleeding and transfuse for hgb <7 VTE px: SCD's, avoid chemical prophylaxis for now  If pt continues  to have hemoptysis will need bronchoscopy   Hyperglycemia  CBG's q4hrs SSI   Mechanical intubation discomfort/pain Maintain RASS goal 0 to -1 Fentanyl gtt and prn versed to maintain RASS goal  WUA daily   SUP px: IV pepcid   Marda Stalker, [place] Pager [phone] (please enter 7 digits) [place] Pager [phone] (please enter 7 digits)

## 2018-03-07 NOTE — Code Documentation (Signed)
Vital signs stable. 

## 2018-03-07 NOTE — ED Provider Notes (Signed)
[facility] Emergency Department Provider Note    First MD Initiated Contact with Patient 03/13/2018 2228     (approximate)  I have reviewed the triage vital signs and the nursing notes.   HISTORY  Chief Complaint Hematemesis    HPI Wayne Chapman is a 62 y.o. male with a history of symptoms of lung cancer of the right lung presents the ER coughing up large volume bright red blood and clots.  Occurred roughly 1 hour prior to arrival.  Denied any pain but did have sudden shortness of breath and feeling a cough for which he called EMS after he was filling up a bucket.  He is not on any blood thinners.  EMS arrived and found with a described as 150 to 200 mL's of blood in a container.  Patient found to be hypoxic to 55% requiring supplemental oxygen.    Past Medical History:  Diagnosis Date  . Anxiety   . Cancer ([place])    lung   . Depression    Family History  Problem Relation Age of Onset  . Diabetes Mellitus II Mother    Past Surgical History:  Procedure Laterality Date  . FLEXIBLE BRONCHOSCOPY N/A 11/11/2017   Procedure: FLEXIBLE BRONCHOSCOPY;  Surgeon: Allyne Gee, MD;  Location: [facility] ORS;  Service: Pulmonary;  Laterality: N/A;  . none    . PORTA CATH INSERTION N/A 11/29/2017   Procedure: PORTA CATH INSERTION;  Surgeon: Katha Cabal, MD;  Location: [place] [facility];  Service: Cardiovascular;  Laterality: N/A;   Patient Active Problem List   Diagnosis Date Noted  . Acute respiratory failure with hypoxia ([place]) 04/07/2018  . Renal mass 01/09/2018  . Tobacco use disorder 01/09/2018  . Squamous cell lung cancer, right ([place]) 11/17/2017  . Necrotizing pneumonia ([place]) 08/11/2017  . Hemoptysis 08/09/2017  . Protein-calorie malnutrition, severe 08/09/2017      Prior to Admission medications   Medication Sig Start Date End Date Taking? Authorizing Provider  Diphenhyd-Hydrocort-Nystatin (FIRST-DUKES MOUTHWASH MT) Swish and swallow 5-10 mLs 4  (four) times daily as needed. 02/28/18  Yes [provider]  megestrol (MEGACE) 40 MG tablet Take 1 tablet (40 mg total) by mouth daily. 12/28/17  Yes Lloyd Huger, MD  ondansetron (ZOFRAN) 8 MG tablet Take 8 mg by mouth 2 (two) times daily as needed for refractory nausea / vomiting.    Yes [provider]  pantoprazole (PROTONIX) 40 MG tablet Take 1 tablet (40 mg total) by mouth daily. 01/09/18  Yes Verlon Au, NP  prochlorperazine (COMPAZINE) 10 MG tablet Take 10 mg by mouth every 6 (six) hours as needed for nausea or vomiting.   Yes [provider]  feeding supplement, ENSURE ENLIVE, (ENSURE ENLIVE) LIQD Take 237 mLs by mouth 3 (three) times daily between meals. Patient not taking: Reported on 03/05/2018 11/13/17   Epifanio Lesches, MD  lidocaine-prilocaine (EMLA) cream Apply 1 application topically as needed. Apply small amount to port site at least 1 hour prior to it being accessed, cover with plastic wrap Patient not taking: Reported on 03/26/2018 01/26/18   Lloyd Huger, MD  thiamine 100 MG tablet Take 1 tablet (100 mg total) by mouth daily. Patient not taking: Reported on 03/20/2018 11/13/17   Epifanio Lesches, MD    Allergies Patient has no known allergies.    Social History Social History   Tobacco Use  . Smoking status: Current Every Day Smoker    Packs/day: 1.00  Types: Cigarettes  . Smokeless tobacco: Never Used  Substance Use Topics  . Alcohol use: Yes    Comment: alot   . Drug use: Yes    Review of Systems Patient denies headaches, rhinorrhea, blurry vision, numbness, shortness of breath, chest pain, edema, cough, abdominal pain, nausea, vomiting, diarrhea, dysuria, fevers, rashes or hallucinations unless otherwise stated above in HPI. ____________________________________________   PHYSICAL EXAM:  VITAL SIGNS: Vitals:   03/11/2018 2350 Mar 11, 2018 0010  BP: (!) 154/96 (!) 149/91  Pulse: (!) 109 (!) 107  Resp: (!) 21 (!)  21  Temp: 99 F (37.2 C) 98.7 F (37.1 C)  SpO2: 93% 92%    Constitutional: Alert and oriented.  Frequently coughing up blood clots.  In moderate to severe respiratory distress. Eyes: Conjunctivae are normal.  Head: Atraumatic. Nose: No congestion/rhinnorhea. Mouth/Throat: Dried blood in the oropharynx. Neck: No stridor. Painless ROM.  Cardiovascular:tachycardic, regular rhythm. Grossly normal heart sounds.  Good peripheral circulation. Respiratory: Moderate to severe respiratory distress diminished breath sounds in the right lung fields. Gastrointestinal: Soft and nontender. No distention. No abdominal bruits. No CVA tenderness. Genitourinary:  Musculoskeletal: No lower extremity tenderness nor edema.  No joint effusions. Neurologic:   No gross focal neurologic deficits are appreciated. No facial droop Skin:  Skin is warm, dry and intact. No rash noted. Psychiatric: Anxious appearing ____________________________________________   LABS (all labs ordered are listed, but only abnormal results are displayed)  Results for orders placed or performed during the hospital encounter of 03/13/2018 (from the past 24 hour(s))  Basic metabolic panel     Status: Abnormal   Collection Time: 03/01/2018 10:26 PM  Result Value Ref Range   Sodium 133 (L) 135 - 145 mmol/L   Potassium 3.9 3.5 - 5.1 mmol/L   Chloride 97 (L) 98 - 111 mmol/L   CO2 22 22 - 32 mmol/L   Glucose, Bld 228 (H) 70 - 99 mg/dL   BUN 13 6 - 20 mg/dL   Creatinine, Ser 0.98 0.61 - 1.24 mg/dL   Calcium 9.0 8.9 - 10.3 mg/dL   GFR calc non Af Amer >60 >60 mL/min   GFR calc Af Amer >60 >60 mL/min   Anion gap 14 5 - 15  CBC     Status: Abnormal   Collection Time: 03/16/2018 10:26 PM  Result Value Ref Range   WBC 10.6 3.8 - 10.6 K/uL   RBC 4.39 (L) 4.40 - 5.90 MIL/uL   Hemoglobin 11.5 (L) 13.0 - 18.0 g/dL   HCT 35.5 (L) 40.0 - 52.0 %   MCV 80.8 80.0 - 100.0 fL   MCH 26.2 26.0 - 34.0 pg   MCHC 32.4 32.0 - 36.0 g/dL   RDW 16.1 (H)  11.5 - 14.5 %   Platelets 299 150 - 440 K/uL  Troponin I     Status: None   Collection Time: 02/27/2018 10:26 PM  Result Value Ref Range   Troponin I <0.03 <0.03 ng/mL  Type and screen [place]     Status: None   Collection Time: 03/16/2018 10:43 PM  Result Value Ref Range   ABO/RH(D) O POS    Antibody Screen NEG    Sample Expiration      03/10/2018 Performed at [facility], [address] [place], [place] [address]    ____________________________________________  EKG My review and personal interpretation at Time: 22:28   Indication: resp distress  Rate: 115  Rhythm: sinus with pvc Axis: normal Other: prominent p waves,  non specific st wave changes ____________________________________________  RADIOLOGY  I personally reviewed all radiographic images ordered to evaluate for the above acute complaints and reviewed radiology reports and findings.  These findings were personally discussed with the patient.  Please see medical record for radiology report.  ____________________________________________   PROCEDURES  Procedure(s) performed:  .Critical Care Performed by: Merlyn Lot, MD Authorized by: Merlyn Lot, MD   Critical care provider statement:    Critical care time (minutes):  61   Critical care time was exclusive of:  Separately billable procedures and treating other patients   Critical care was necessary to treat or prevent imminent or life-threatening deterioration of the following conditions:  Respiratory failure   Critical care was time spent personally by me on the following activities:  Development of treatment plan with patient or surrogate, discussions with consultants, evaluation of patient's response to treatment, examination of patient, obtaining history from patient or surrogate, ordering and performing treatments and interventions, ordering and review of laboratory studies, ordering and review of radiographic studies,  pulse oximetry, re-evaluation of patient's condition and review of old charts Procedure Name: Intubation Date/Time: Apr 05, 2018 12:26 AM Performed by: Merlyn Lot, MD Pre-anesthesia Checklist: Patient identified, Patient being monitored, Emergency Drugs available, Timeout performed and Suction available Oxygen Delivery Method: Non-rebreather mask Preoxygenation: Pre-oxygenation with 100% oxygen Induction Type: Rapid sequence Ventilation: Mask ventilation without difficulty Laryngoscope Size: Glidescope and 3 Grade View: Grade III Tube size: 7.5 mm Number of attempts: 1 Airway Equipment and Method: Video-laryngoscopy Placement Confirmation: ETT inserted through vocal cords under direct vision,  CO2 detector,  Breath sounds checked- equal and bilateral and Positive ETCO2 Secured at: 24 cm Tube secured with: ETT holder Difficulty Due To: Difficulty was anticipated         Critical Care performed: yes ____________________________________________   INITIAL IMPRESSION / [place] / ED COURSE  Pertinent labs & imaging results that were available during my care of the patient were reviewed by me and considered in my medical decision making (see chart for details).   DDX: Massive hemoptysis, malignancy, PE, pneumonia, tracheitis  Wayne Chapman is a 62 y.o. who presents to the ED with acute respiratory failure as described above.  Patient with massive hemoptysis by report of volume still coughing up blood.  Not on any blood thinners.  Given his history concern for the above differential.  Discussed at bedside my concern.  He is currently protecting his airway but I described and discussed my concern for sudden deterioration and as his bleeding seems to be tapering off at this point, he does seem to have persistent tachypnea and respiratory distress there is a 4 out of recommended intubation at this time.  Patient nods his head yes and agrees understanding the risks and  benefits.  Clinical Course as of Mar 09 27  Tue Mar 07, 2018  2324 Patient remains critically ill but currently hemodynamically stable.  Mild tachycardia.   [PR]  2345 CT imaging shows persistent cavitary mass.  Does have some debris in the mainstem bronchus consistent with hemorrhage.  Does not have any worsening hemoptysis through ET tube.  Oxygenating appropriately on the vent.  No evidence of PE.  At this point I do believe he stabilized and appropriate for admission in ICU.   [PR]    Clinical Course User Index [PR] Merlyn Lot, MD     As part of my medical decision making, I reviewed the following data within the [place] notes  reviewed and incorporated, Labs reviewed, notes from prior ED visits.   ____________________________________________   FINAL CLINICAL IMPRESSION(S) / ED DIAGNOSES  Final diagnoses:  Massive hemoptysis  Acute respiratory failure with hypoxia (HCC)      NEW MEDICATIONS STARTED DURING THIS VISIT:  New Prescriptions   No medications on file     Note:  This document was prepared using Dragon voice recognition software and may include unintentional dictation errors.    Merlyn Lot, MD 03/29/18 Shelah Lewandowsky

## 2018-03-07 NOTE — ED Triage Notes (Addendum)
Pt bib acems from home d/t bright red blood and clots present in emesis/cough.  Sx started suddenly approx 2145.  No hx same.  Pt a&o x4, hx right sided lung CA III, no home O2, but 55% room air on EMS arrival. Placed on high flow nasal cannula 3L and sats in mid-high 90%. HR 124 with frequent PVCs. CBG 126, #20 PIV L forearm 272ml NS in route, 4 mg zofran.Per significant other (via telephone-verbal consent from pt to speak with her regarding his care) states last tx was last Tuesday. Coughing up minimal thick blood upon arrival.

## 2018-03-08 ENCOUNTER — Encounter: Admission: EM | Disposition: E | Payer: Self-pay | Source: Home / Self Care | Attending: Internal Medicine

## 2018-03-08 DIAGNOSIS — E43 Unspecified severe protein-calorie malnutrition: Secondary | ICD-10-CM | POA: Diagnosis present

## 2018-03-08 DIAGNOSIS — Z681 Body mass index (BMI) 19 or less, adult: Secondary | ICD-10-CM | POA: Diagnosis not present

## 2018-03-08 DIAGNOSIS — F329 Major depressive disorder, single episode, unspecified: Secondary | ICD-10-CM | POA: Diagnosis present

## 2018-03-08 DIAGNOSIS — R042 Hemoptysis: Secondary | ICD-10-CM

## 2018-03-08 DIAGNOSIS — J9621 Acute and chronic respiratory failure with hypoxia: Secondary | ICD-10-CM | POA: Diagnosis present

## 2018-03-08 DIAGNOSIS — F17210 Nicotine dependence, cigarettes, uncomplicated: Secondary | ICD-10-CM | POA: Diagnosis present

## 2018-03-08 DIAGNOSIS — N2889 Other specified disorders of kidney and ureter: Secondary | ICD-10-CM | POA: Diagnosis present

## 2018-03-08 DIAGNOSIS — Z66 Do not resuscitate: Secondary | ICD-10-CM | POA: Diagnosis present

## 2018-03-08 DIAGNOSIS — J860 Pyothorax with fistula: Secondary | ICD-10-CM | POA: Diagnosis present

## 2018-03-08 DIAGNOSIS — K920 Hematemesis: Secondary | ICD-10-CM | POA: Diagnosis present

## 2018-03-08 DIAGNOSIS — J439 Emphysema, unspecified: Secondary | ICD-10-CM | POA: Diagnosis present

## 2018-03-08 DIAGNOSIS — Z833 Family history of diabetes mellitus: Secondary | ICD-10-CM | POA: Diagnosis not present

## 2018-03-08 DIAGNOSIS — J9601 Acute respiratory failure with hypoxia: Secondary | ICD-10-CM | POA: Diagnosis present

## 2018-03-08 DIAGNOSIS — F419 Anxiety disorder, unspecified: Secondary | ICD-10-CM | POA: Diagnosis present

## 2018-03-08 DIAGNOSIS — I700 Atherosclerosis of aorta: Secondary | ICD-10-CM | POA: Diagnosis present

## 2018-03-08 DIAGNOSIS — C3411 Malignant neoplasm of upper lobe, right bronchus or lung: Secondary | ICD-10-CM | POA: Diagnosis present

## 2018-03-08 DIAGNOSIS — I493 Ventricular premature depolarization: Secondary | ICD-10-CM | POA: Diagnosis present

## 2018-03-08 DIAGNOSIS — I469 Cardiac arrest, cause unspecified: Secondary | ICD-10-CM | POA: Diagnosis not present

## 2018-03-08 LAB — MRSA PCR SCREENING: MRSA BY PCR: NEGATIVE

## 2018-03-08 LAB — BASIC METABOLIC PANEL
ANION GAP: 7 (ref 5–15)
BUN: 13 mg/dL (ref 6–20)
CHLORIDE: 101 mmol/L (ref 98–111)
CO2: 23 mmol/L (ref 22–32)
Calcium: 8.6 mg/dL — ABNORMAL LOW (ref 8.9–10.3)
Creatinine, Ser: 0.58 mg/dL — ABNORMAL LOW (ref 0.61–1.24)
GFR calc non Af Amer: >60 mL/min (ref >60)
Glucose, Bld: 190 mg/dL — ABNORMAL HIGH (ref 70–99)
POTASSIUM: 4 mmol/L (ref 3.5–5.1)
Sodium: 131 mmol/L — ABNORMAL LOW (ref 135–145)

## 2018-03-08 LAB — HEMOGLOBIN AND HEMATOCRIT, BLOOD
HEMATOCRIT: 34.7 % — AB (ref 40.0–52.0)
HEMOGLOBIN: 11.2 g/dL — AB (ref 13.0–18.0)

## 2018-03-08 LAB — BLOOD GAS, ARTERIAL
Acid-Base Excess: 0.5 mmol/L (ref 0.0–2.0)
Bicarbonate: 25.4 mmol/L (ref 20.0–28.0)
FIO2: 0.4
O2 Saturation: 95.1 %
PEEP: 5 cmH2O
PH ART: 7.4 (ref 7.350–7.450)
Patient temperature: 37
RATE: 12 resp/min
VT: 450 mL
pCO2 arterial: 41 mmHg (ref 32.0–48.0)
pO2, Arterial: 76 mmHg — ABNORMAL LOW (ref 83.0–108.0)

## 2018-03-08 LAB — GLUCOSE, CAPILLARY
GLUCOSE-CAPILLARY: 203 mg/dL — AB (ref 70–99)
GLUCOSE-CAPILLARY: 118 mg/dL — AB (ref 70–99)
Glucose-Capillary: 100 mg/dL — ABNORMAL HIGH (ref 70–99)
Glucose-Capillary: 104 mg/dL — ABNORMAL HIGH (ref 70–99)

## 2018-03-08 LAB — URINE DRUG SCREEN, QUALITATIVE (ARMC ONLY)
Amphetamines, Ur Screen: NOT DETECTED
BARBITURATES, UR SCREEN: NOT DETECTED
BENZODIAZEPINE, UR SCRN: POSITIVE — AB
CANNABINOID 50 NG, UR ~~LOC~~: NOT DETECTED
Cocaine Metabolite,Ur ~~LOC~~: POSITIVE — AB
MDMA (Ecstasy)Ur Screen: NOT DETECTED
Methadone Scn, Ur: NOT DETECTED
Opiate, Ur Screen: NOT DETECTED
Phencyclidine (PCP) Ur S: NOT DETECTED
TRICYCLIC, UR SCREEN: POSITIVE — AB

## 2018-03-08 LAB — CBC
HCT: 33.5 % — ABNORMAL LOW (ref 40.0–52.0)
HEMOGLOBIN: 10.6 g/dL — AB (ref 13.0–18.0)
MCH: 25.4 pg — ABNORMAL LOW (ref 26.0–34.0)
MCHC: 31.8 g/dL — ABNORMAL LOW (ref 32.0–36.0)
MCV: 80 fL (ref 80.0–100.0)
Platelets: 270 10*3/uL (ref 150–440)
RBC: 4.18 MIL/uL — AB (ref 4.40–5.90)
RDW: 15.9 % — ABNORMAL HIGH (ref 11.5–14.5)
WBC: 11.2 10*3/uL — ABNORMAL HIGH (ref 3.8–10.6)

## 2018-03-08 LAB — TRIGLYCERIDES: TRIGLYCERIDES: 61 mg/dL (ref <150)

## 2018-03-08 LAB — PHOSPHORUS: PHOSPHORUS: 3.7 mg/dL (ref 2.5–4.6)

## 2018-03-08 LAB — PROCALCITONIN: Procalcitonin: 0.88 ng/mL

## 2018-03-08 SURGERY — EMBOLIZATION
Anesthesia: Moderate Sedation | Laterality: Right

## 2018-03-08 MED ORDER — VITAL AF 1.2 CAL PO LIQD: 1000.0000 mL | ORAL | Status: DC

## 2018-03-08 MED ORDER — IPRATROPIUM-ALBUTEROL 0.5-2.5 (3) MG/3ML IN SOLN: 3.0000 mL | Freq: Four times a day (QID) | RESPIRATORY_TRACT | Status: DC | PRN

## 2018-03-08 MED ORDER — ONDANSETRON HCL 4 MG/2ML IJ SOLN: 4.0000 mg | Freq: Four times a day (QID) | INTRAMUSCULAR | Status: DC | PRN

## 2018-03-08 MED ORDER — SODIUM CHLORIDE 0.9 % IV SOLN: 250.0000 mL | INTRAVENOUS | Status: DC | PRN

## 2018-03-08 MED ORDER — PIPERACILLIN-TAZOBACTAM 3.375 G IVPB
3.3750 g | Freq: Three times a day (TID) | INTRAVENOUS | Status: DC
  Administered 2018-03-08 (×2): 3.375 g via INTRAVENOUS
  Filled 2018-03-08 (×2): qty 50

## 2018-03-08 MED ORDER — FAMOTIDINE IN NACL 20-0.9 MG/50ML-% IV SOLN
20.0000 mg | Freq: Two times a day (BID) | INTRAVENOUS | Status: DC
  Administered 2018-03-08 (×2): 20 mg via INTRAVENOUS
  Filled 2018-03-08 (×2): qty 50

## 2018-03-08 MED ORDER — SENNOSIDES 8.8 MG/5ML PO SYRP
5.0000 mL | ORAL_SOLUTION | Freq: Two times a day (BID) | ORAL | Status: DC | PRN
  Filled 2018-03-08: qty 5

## 2018-03-08 MED ORDER — CHLORHEXIDINE GLUCONATE 0.12% ORAL RINSE (MEDLINE KIT)
15.0000 mL | Freq: Two times a day (BID) | OROMUCOSAL | Status: DC
  Administered 2018-03-08: 15 mL via OROMUCOSAL

## 2018-03-08 MED ORDER — ORAL CARE MOUTH RINSE
15.0000 mL | OROMUCOSAL | Status: DC
  Administered 2018-03-08: 15 mL via OROMUCOSAL

## 2018-03-08 MED ORDER — FAMOTIDINE 20 MG PO TABS: 20.0000 mg | ORAL_TABLET | Freq: Two times a day (BID) | ORAL | Status: DC

## 2018-03-08 MED ORDER — PROPOFOL 1000 MG/100ML IV EMUL
INTRAVENOUS | Status: AC
  Administered 2018-03-08: 20 ug/kg/min via INTRAVENOUS
  Filled 2018-03-08: qty 100

## 2018-03-08 MED ORDER — INSULIN ASPART 100 UNIT/ML ~~LOC~~ SOLN
0.0000 [IU] | SUBCUTANEOUS | Status: DC
  Administered 2018-03-08: 3 [IU] via SUBCUTANEOUS

## 2018-03-08 MED ORDER — ADULT MULTIVITAMIN LIQUID CH
15.0000 mL | Freq: Every day | ORAL | Status: DC
  Filled 2018-03-08 (×2): qty 15

## 2018-03-08 MED ORDER — INSULIN ASPART 100 UNIT/ML ~~LOC~~ SOLN
SUBCUTANEOUS | Status: AC
  Administered 2018-03-08: 3 [IU]
  Filled 2018-03-08: qty 1

## 2018-03-08 MED ORDER — ASPIRIN 81 MG PO CHEW: 324.0000 mg | CHEWABLE_TABLET | ORAL | Status: DC

## 2018-03-08 MED ORDER — ASPIRIN 300 MG RE SUPP: 300.0000 mg | RECTAL | Status: DC

## 2018-03-08 MED ORDER — PROPOFOL 1000 MG/100ML IV EMUL
0.0000 ug/kg/min | INTRAVENOUS | Status: DC
  Administered 2018-03-08: 30 ug/kg/min via INTRAVENOUS
  Administered 2018-03-08: 20 ug/kg/min via INTRAVENOUS
  Filled 2018-03-08: qty 100

## 2018-03-08 MED ORDER — VITAL HIGH PROTEIN PO LIQD: 1000.0000 mL | ORAL | Status: DC

## 2018-03-09 ENCOUNTER — Other Ambulatory Visit: Payer: Self-pay | Admitting: Oncology

## 2018-03-10 LAB — CULTURE, RESPIRATORY

## 2018-03-10 LAB — CULTURE, RESPIRATORY W GRAM STAIN

## 2018-03-14 ENCOUNTER — Telehealth: Payer: Self-pay | Admitting: Pulmonary Disease

## 2018-03-14 NOTE — Telephone Encounter (Signed)
Recieved Death Certificate from [employer] Delivered/Placed in Pulmonary in box.

## 2018-03-14 NOTE — Telephone Encounter (Signed)
Death certificate placed in folder.

## 2018-03-16 ENCOUNTER — Other Ambulatory Visit: Payer: Medicare Other

## 2018-03-16 ENCOUNTER — Ambulatory Visit: Payer: Medicare Other

## 2018-03-16 ENCOUNTER — Telehealth: Payer: Self-pay | Admitting: Pulmonary Disease

## 2018-03-16 NOTE — Telephone Encounter (Signed)
Please call regarding status of death cerificate

## 2018-03-16 NOTE — Telephone Encounter (Signed)
Provider just returned to office today. Death certificate given to DS.

## 2018-03-16 NOTE — Telephone Encounter (Signed)
Please call regarding death certificate signature. States it is ok to leave [employer] contact # [phone]

## 2018-03-16 NOTE — Telephone Encounter (Signed)
[facility] home informed death cert is ready for pick up.

## 2018-03-24 ENCOUNTER — Ambulatory Visit: Payer: Medicare Other | Admitting: Radiation Oncology

## 2018-03-28 NOTE — H&P (Signed)
[facility] NAME: Wayne Chapman    MR#:  [id number]  DATE OF BIRTH:  March 19, 1956  DATE OF ADMISSION:  03/17/2018  PRIMARY CARE PHYSICIAN: [facility] [place]   REQUESTING/REFERRING PHYSICIAN: Quentin Cornwall, MD  CHIEF COMPLAINT:   Chief Complaint  Patient presents with  . Hematemesis    HISTORY OF PRESENT ILLNESS:  Wayne Chapman  is a 62 y.o. male who presents with chief complaint as above.  Patient presented to the ED with large amounts of hemoptysis.  He has lung cancer and is being actively treated.  His hemoptysis started about an hour prior to arrival at the ED.  He was coughing up significant amounts of bright blood as well as clots.  He is not on any anticoagulation.  He began to be hypoxic in the ED and required intubation.  Postintubation his bleeding improved and slowed and then stopped.  Hospitalist were called for admission  PAST MEDICAL HISTORY:   Past Medical History:  Diagnosis Date  . Anxiety   . Cancer ([place])    lung   . Depression      PAST SURGICAL HISTORY:   Past Surgical History:  Procedure Laterality Date  . FLEXIBLE BRONCHOSCOPY N/A 11/11/2017   Procedure: FLEXIBLE BRONCHOSCOPY;  Surgeon: Allyne Gee, MD;  Location: [facility]S;  Service: Pulmonary;  Laterality: N/A;  . none    . PORTA CATH INSERTION N/A 11/29/2017   Procedure: PORTA CATH INSERTION;  Surgeon: Katha Cabal, MD;  Location: [facility];  Service: Cardiovascular;  Laterality: N/A;     SOCIAL HISTORY:   Social History   Tobacco Use  . Smoking status: Current Every Day Smoker    Packs/day: 1.00    Types: Cigarettes  . Smokeless tobacco: Never Used  Substance Use Topics  . Alcohol use: Yes    Comment: alot      FAMILY HISTORY:   Family History  Problem Relation Age of Onset  . Diabetes Mellitus II Mother      DRUG ALLERGIES:  No Known Allergies  MEDICATIONS AT HOME:   Prior to Admission  medications   Medication Sig Start Date End Date Taking? Authorizing Provider  Diphenhyd-Hydrocort-Nystatin (FIRST-DUKES MOUTHWASH MT) Swish and swallow 5-10 mLs 4 (four) times daily as needed. 02/28/18  Yes [provider]  megestrol (MEGACE) 40 MG tablet Take 1 tablet (40 mg total) by mouth daily. 12/28/17  Yes Lloyd Huger, MD  ondansetron (ZOFRAN) 8 MG tablet Take 8 mg by mouth 2 (two) times daily as needed for refractory nausea / vomiting.    Yes [provider]  pantoprazole (PROTONIX) 40 MG tablet Take 1 tablet (40 mg total) by mouth daily. 01/09/18  Yes Verlon Au, NP  prochlorperazine (COMPAZINE) 10 MG tablet Take 10 mg by mouth every 6 (six) hours as needed for nausea or vomiting.   Yes [provider]  feeding supplement, ENSURE ENLIVE, (ENSURE ENLIVE) LIQD Take 237 mLs by mouth 3 (three) times daily between meals. Patient not taking: Reported on 03/04/2018 11/13/17   Epifanio Lesches, MD  lidocaine-prilocaine (EMLA) cream Apply 1 application topically as needed. Apply small amount to port site at least 1 hour prior to it being accessed, cover with plastic wrap Patient not taking: Reported on 03/24/2018 01/26/18   Lloyd Huger, MD  thiamine 100 MG tablet Take 1 tablet (100 mg total) by mouth daily. Patient not taking: Reported on 03/18/2018 11/13/17  Epifanio Lesches, MD    REVIEW OF SYSTEMS:  Review of Systems  Unable to perform ROS: Critical illness     VITAL SIGNS:   Vitals:   03/18/2018 2320 02/26/2018 2330 03/06/2018 2350 03/24/2018 0010  BP:  (!) 166/97 (!) 154/96 (!) 149/91  Pulse: (!) 108 (!) 109 (!) 109 (!) 107  Resp: (!) 21 (!) 22 (!) 21 (!) 21  Temp: 99.6 F (37.6 C) 99.1 F (37.3 C) 99 F (37.2 C) 98.7 F (37.1 C)  SpO2: 96% 94% 93% 92%   Wt Readings from Last 3 Encounters:  03/02/18 57.8 kg  02/16/18 57.8 kg  02/02/18 58.8 kg    PHYSICAL EXAMINATION:  Physical Exam  Vitals reviewed. Constitutional: He appears  well-developed and well-nourished. No distress.  HENT:  Head: Normocephalic and atraumatic.  Mouth/Throat: Oropharynx is clear and moist.  Frank blood in suction tube  Eyes: Pupils are equal, round, and reactive to light. Conjunctivae and EOM are normal. No scleral icterus.  Neck: Normal range of motion. Neck supple. No JVD present. No thyromegaly present.  Cardiovascular: Normal rate, regular rhythm and intact distal pulses. Exam reveals no gallop and no friction rub.  No murmur heard. Respiratory: Effort normal and breath sounds normal. No respiratory distress. He has no wheezes. He has no rales.  GI: Soft. Bowel sounds are normal. He exhibits no distension. There is no tenderness.  Musculoskeletal: Normal range of motion. He exhibits no edema.  No arthritis, no gout  Lymphadenopathy:    He has no cervical adenopathy.  Neurological:  Unable to assess due to critical condition  Skin: Skin is warm and dry. No rash noted. No erythema.  Psychiatric:  Unable to assess due to critical condition    LABORATORY PANEL:   CBC Recent Labs  Lab 03/16/2018 2226  WBC 10.6  HGB 11.5*  HCT 35.5*  PLT 299   ------------------------------------------------------------------------------------------------------------------  Chemistries  Recent Labs  Lab 03/02/18 0831 03/10/2018 2226  NA 136 133*  K 3.5 3.9  CL 102 97*  CO2 23 22  GLUCOSE 180* 228*  BUN 12 13  CREATININE 0.67 0.98  CALCIUM 9.4 9.0  AST 25  --   ALT 16  --   ALKPHOS 62  --   BILITOT 0.4  --    ------------------------------------------------------------------------------------------------------------------  Cardiac Enzymes Recent Labs  Lab 03/13/2018 2226  TROPONINI <0.03   ------------------------------------------------------------------------------------------------------------------  RADIOLOGY:  Ct Angio Chest Pe W And/or Wo Contrast  Result Date: 03/02/2018 CLINICAL DATA:  Shortness of breath, massive  hemoptysis, bronchial hemorrhage, question pulmonary embolism, cough, history of RIGHT-sided lung cancer, sudden onset of symptoms at 2145 hours EXAM: CT ANGIOGRAPHY CHEST WITH CONTRAST TECHNIQUE: Multidetector CT imaging of the chest was performed using the standard protocol during bolus administration of intravenous contrast. Multiplanar CT image reconstructions and MIPs were obtained to evaluate the vascular anatomy. CONTRAST:  8m OMNIPAQUE IOHEXOL 350 MG/ML SOLN IV COMPARISON:  PET/CT 02/28/2018, CT chest 11/09/2017 FINDINGS: Cardiovascular: Scattered atherosclerotic calcifications aorta, proximal great vessels and coronary arteries. Aorta normal caliber without aneurysm or dissection. Pulmonary arteries well opacified and patent. No evidence of pulmonary embolism. Minimal pericardial effusion. Heart unremarkable. Mediastinum/Nodes: Tip of endotracheal tube above carina. Nasogastric tube traverses esophagus into stomach. Base of cervical region unremarkable. Calcified subcarinal lymph nodes. No definite thoracic adenopathy. Lungs/Pleura: Again identified large cavity in the upper RIGHT hemithorax containing gas, 6.6 x 4.9 x 5.7 cm containing septations. Pleural thickening/fluid loculated at the RIGHT hemithorax again seen. This collection appears contiguous  with the RIGHT mainstem bronchus, with note of internal material within the RIGHT mainstem bronchus extending to carina and proximal LEFT mainstem bronchus question mucus or blood. Central peribronchial thickening. RIGHT lower lobe nodule with spiculated margins question neoplasm, 16 x 12 mm, previously 15 x 11 mm. Patchy areas of airspace opacification are seen throughout both lower lobes, less in LEFT upper lobe, could represent infection or alveolar hemorrhage, all new. Scattered material is seen throughout bronchi in particularly the RIGHT lower which could represent mucus or blood. Numerous BILATERAL pulmonary nodules including a 6 mm cavitary nodule  in LEFT lower lobe, unchanged. Underlying emphysematous changes. Upper Abdomen: Unremarkable Musculoskeletal: No definite osseous metastases. Review of the MIP images confirms the above findings. IMPRESSION: No evidence of pulmonary embolism. Chronic posttherapy changes at the RIGHT upper lobe including a large cavitary focus with chronic pleural thickening/effusion unchanged. Multiple new patchy areas of airspace opacification throughout BILATERAL lower lobes and LEFT upper lobe which could represent multifocal pneumonia or alveolar hemorrhage. New endobronchial material especially RIGHT lower lobe could represent blood, mucus or infection. Multiple pulmonary nodules, largest 16 x 12 mm RIGHT lower lobe, little changed. Aortic Atherosclerosis (ICD10-I70.0) and Emphysema (ICD10-J43.9). Electronically Signed   By: Lavonia Dana M.D.   On: 03/21/2018 23:36   Dg Chest Portable 1 View  Result Date: 03/06/2018 CLINICAL DATA:  ETT placed EXAM: PORTABLE CHEST 1 VIEW COMPARISON:  03/26/2018 FINDINGS: Placement of endotracheal tube, the tip projects over superior aspect of T5. Left-sided central venous catheter with tip over the distal SVC. Right upper lobe opacity with cystic lucency. Esophageal tube tip below the diaphragm but non included on the image. Stable cardiomediastinal silhouette with aortic atherosclerosis. IMPRESSION: 1. Placement of endotracheal tube, tip about 2 cm superior to carina but difficult visualization of the carina. 2. Right upper lobe lung mass as before. Electronically Signed   By: Donavan Foil M.D.   On: 03/22/2018 23:09   Dg Chest Portable 1 View  Result Date: 03/05/2018 CLINICAL DATA:  Lung cancer, coughing up blood EXAM: PORTABLE CHEST 1 VIEW COMPARISON:  PET-CT 02/28/2018, radiograph 11/09/2017 FINDINGS: Left-sided central venous port tip over the cavoatrial junction. Right upper lobe opacity with cystic changes corresponding to known lung mass. Normal heart size. No pneumothorax.  IMPRESSION: No acute opacity. Right upper lobe opacity with areas of lucency corresponding to known lung mass in the region. Electronically Signed   By: Donavan Foil M.D.   On: 03/06/2018 23:07    EKG:   Orders placed or performed during the hospital encounter of 02/28/2018  . ED EKG within 10 minutes  . ED EKG within 10 minutes    IMPRESSION AND PLAN:  Principal Problem:   Hemoptysis -very likely related to his cancer and/or treatment.  He could benefit from a pulmonology consult for potential bronchoscopy, he will be admitted to the ICU with intensivist following along Active Problems:   Acute respiratory failure with hypoxia ([place]) -patient is intubated and sedated, admit to ICU with ventilator protocol   Squamous cell lung cancer, right [facility]) -oncology consult to coordinate with pulmonology for possible continue treatment  Chart review performed and case discussed with ED provider. Labs, imaging and/or ECG reviewed by provider and discussed with patient/family. Management plans discussed with the patient and/or family.  DVT PROPHYLAXIS: Mechanical only  GI PROPHYLAXIS:  H2 blocker  ADMISSION STATUS: Inpatient     CODE STATUS: Full Code Status History    Date Active Date Inactive Code Status Order ID Comments  User Context   11/09/2017 1623 11/13/2017 1934 Full Code [id number]  Nicholes Mango, MD Inpatient   08/09/2017 0841 08/12/2017 1939 Full Code [id number]  Harrie Foreman, MD Inpatient      TOTAL CRITICAL CARE TIME TAKING CARE OF THIS PATIENT: 50 minutes.   Tiona [place] 03/15/2018, 12:50 AM  [employer]  [phone]  CC: Primary care physician; [facility] [place]  Note:  This document was prepared using Systems analyst and may include unintentional dictation errors.

## 2018-03-28 NOTE — Discharge Instructions (Signed)

## 2018-03-28 NOTE — Progress Notes (Signed)
   Mar 23, 2018 1520  Clinical Encounter Type  Visited With Family;Health care provider  Visit Type Follow-up;Code;Death  Spiritual Encounters  Spiritual Needs Emotional;Grief support   Chaplain responded to code, offering emotional support and calming presence for patient's daughter as team worked.  Chaplain stayed with daughter through patient's care and 28 conversation with daughter regarding patient's death.  Daughter began calling family; patient's sister is on her way.  Daughter uncertain if patient's significant other will return.  Chaplain walked with daughter to patient's bedside, left them to time alone.  Daughter reported that she had had her talk with God; chaplain encouraged her to have staff page if she would like chaplain to return.

## 2018-03-28 NOTE — Progress Notes (Signed)
   04/05/2018 1130  Clinical Encounter Type  Visited With Family;Patient;Health care provider  Visit Type Initial  Referral From Physician  Consult/Referral To [place] responded to physician's request to offer emotional support to family in waiting room.  Chaplain engaged patient's spouse and other family members regarding dialogue of changes in patient's condition.  When family went back to see patient, chaplain accompanied, providing a calming presence.  Chaplain and patient's spouse prayed together before spouse and one other departed.  One family member remaining at bedside.

## 2018-03-28 NOTE — Progress Notes (Signed)
Patient ID: Wayne Chapman, male   DOB: [birth year], 62 y.o.   MRN: [id number] Pulmonary/critical care attending  Procedure note Bronchoscopy Consent is obtained from wife Risks and benefits reviewed Bronchoscope inserted through an endotracheal tube adapter. The endotracheal tube was at distal trachea and pullback 2 cm The architecture of all the mucosa was abnormal, heaped up, edematous. Bronchoscope inserted into the left mainstem bronchus, the left upper and left lower lobe divisions were noted, all airways were cleared of blood on the left and bronchoscope pulled to the level of the carina As the bronchoscope 1 into the right mainstem there was a large defect where the right upper lobe bronchus should be extending into what appears to bethe large right upper lobe cavity, with oozing blood from the cavity. Topical epinephrine and ice saline injected Bronchoscope inserted to the bronchus intermedius past the large cavitary opening into the right upper lobe. Minimal lumen apparent for the right middle lobe, was able to pass bronchoscope in without any clear endobronchial lesion although all the mucosa in the right lung appear grossly abnormal Bronchoscope inserted into the basilar segments multiple blood clots were found with abnormal heaped up mucosa. Upon termination of procedure there was some oozing coming out of the large cavity in the right upper lobe.  Discussed with family We'll consult vascular surgery for bronchial artery embolization  Hermelinda Dellen, D.O.

## 2018-03-28 NOTE — Progress Notes (Signed)
Pharmacy Antibiotic Note  DALAN COWGER is a 62 y.o. male admitted on 03/21/2018 with aspiration pneumonia.  Pharmacy has been consulted for zosyn dosing.  Plan: Zosyn 3.375g IV q8h (4 hour infusion).  Height: 6' (182.9 cm) Weight: 123 lb 0.3 oz (55.8 kg) IBW/kg (Calculated) : 77.6  Temp (24hrs), Avg:98.9 F (37.2 C), Min:98.2 F (36.8 C), Max:99.7 F (37.6 C)  Recent Labs  Lab 03/02/18 0831 03/24/2018 2226  WBC 9.0 10.6  CREATININE 0.67 0.98    Estimated Creatinine Clearance: 63.3 mL/min (by C-G formula based on SCr of 0.98 mg/dL).    No Known Allergies   Thank you for allowing pharmacy to be a part of this patient's care.  Tobie Lords, PharmD, BCPS Clinical Pharmacist 03/09/18

## 2018-03-28 NOTE — Progress Notes (Signed)
Time of death 1532. Dr. Jefferson Fuel at the bedside. Family also at the bedside.

## 2018-03-28 NOTE — Progress Notes (Signed)
Patient ID: Wayne Chapman, male   DOB: 22-May-1956, 62 y.o.   MRN: [id number] Pulmonary/critical care attending  Patient suffered a cardiac arrest. Had copious blood coming from endotracheal tube. Went to PDA. One round of CPR was performed with brief return of spontaneous circulation. Discussed with family and he was made a DO NOT RESUSCITATE. Patient subsequently lost his pulse, was in PDA, confirmed with Dopplers in the carotid and femoral locations, no auscultated cardiac sounds. Family notified  Hermelinda Dellen, D.O.

## 2018-03-28 NOTE — Progress Notes (Signed)
PHARMACIST - PHYSICIAN COMMUNICATION  CONCERNING: IV to Oral Route Change Policy  RECOMMENDATION: This patient is receiving famotidine by the intravenous route.  Based on criteria approved by the Pharmacy and Therapeutics Committee, the intravenous medication(s) is/are being converted to the equivalent oral dose form(s).   DESCRIPTION: These criteria include:  The patient is eating (either orally or via tube) and/or has been taking other orally administered medications for a least 24 hours  The patient has no evidence of active gastrointestinal bleeding or impaired GI absorption (gastrectomy, short bowel, patient on TNA or NPO).  If you have questions about this conversion, please contact the Pharmacy Department  []   [phone] )  [facility] [x]   [phone] )  [facility] []   [phone] )  [facility] []   [phone] )  [facility] []   [phone] )  [place], [facility] 2018/04/02 2:40 PM

## 2018-03-28 NOTE — Progress Notes (Signed)
Pharmacy Antibiotic Note  Wayne Chapman is a 62 y.o. male admitted on 03/06/2018 with hemoptysis. Patient with h/o lung cancer. S/p bronchoscopy today. Pharmacy has been consulted for Zosyn dosing with concern for aspiration PNA.  Plan: Per ICU rounds with CCM 9/11 am, the patient is at increased risk for possible pseudomonas infection so we will continue Zosyn 3.375 g IV extended infusion q8h  Height: 6' (182.9 cm) Weight: 123 lb 0.3 oz (55.8 kg) IBW/kg (Calculated) : 77.6  Temp (24hrs), Avg:98.9 F (37.2 C), Min:98.1 F (36.7 C), Max:99.7 F (37.6 C)  Recent Labs  Lab 03/02/18 0831 03/09/2018 2226 2018-03-21 0233  WBC 9.0 10.6 11.2*  CREATININE 0.67 0.98 0.58*    Estimated Creatinine Clearance: 77.5 mL/min (A) (by C-G formula based on SCr of 0.58 mg/dL (L)).    No Known Allergies  Antimicrobials this admission: Zosyn 9/11 >>  Dose adjustments this admission: N/A  Microbiology results: 9/11 Sputum: few GPC, GNR 9/11 MRSA PCR: negative  Thank you for allowing pharmacy to be a part of this patient's care.  Tawnya Crook, PharmD Pharmacy Resident  03-21-18 1:32 PM

## 2018-03-28 NOTE — Progress Notes (Signed)
RT to assist with patient transport on ventilator. Patient transported from ED room 5 to CT, then back to ED room 5. Patient transported without complications. 450vt, 12R 5+, 50% FIO2 with a SAT of 97%

## 2018-03-28 NOTE — Progress Notes (Signed)
Pharmacy Electrolyte Monitoring Consult:  Pharmacy consulted to assist in monitoring and replacing electrolytes in this 62 y.o. male admitted on 03/26/2018 with Hematemesis  Patient is currently intubated and underwent bronchoscopy this am.   Labs:  Sodium (mmol/L)  Date Value  2018-04-02 131 (L)  07/07/2014 142   Potassium (mmol/L)  Date Value  04-02-18 4.0  07/07/2014 4.2   Magnesium (mg/dL)  Date Value  01/09/2018 1.9   Phosphorus (mg/dL)  Date Value  04/02/2018 3.7   Calcium (mg/dL)  Date Value  Apr 02, 2018 8.6 (L)   Calcium, Total (mg/dL)  Date Value  07/07/2014 8.4 (L)   Albumin (g/dL)  Date Value  03/02/2018 3.3 (L)  07/07/2014 4.0    Assessment/Plan:  Patient is at risk for refeeding. No electrolyte replacement currently warranted. Will recheck electrolytes with am labs.   Pharmacy will continue to monitor and adjust per consult.   Simpson,Michael L 04/02/2018 12:15 PM

## 2018-03-28 NOTE — Death Summary Note (Signed)
DEATH SUMMARY   Patient Details  Name: Wayne Chapman MRN: [id number] DOB: 13-Aug-1955  Admission/Discharge Information   Admit Date:  04/06/2018  Date of Death:  04/07/2018  Time of Death: see nurses notes for timing death  Length of Stay: 0  Referring Physician: [facility] [place]   Reason(s) for Hospitalization  Hemoptysis  Diagnoses  Preliminary cause of death:  Secondary Diagnoses (including complications and co-morbidities):  Principal Problem:   Hemoptysis Active Problems:   Squamous cell lung cancer, right ([place])   Acute respiratory failure with hypoxia [facility])   Brief Hospital Course (including significant findings, care, treatment, and services provided and events leading to death)     62 yo male with PMH of Depression, Anxiety, 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 He presented to [facility] ER from home via EMS on 04-07-23 with c/oshortness of breath andcoughing/vomiting bright red blood with clotssymptoms occurred 1 hour prior to ER arrival. Upon EMS arrival at pts home his O2 sats were 55% on RA, therefore he was placed on supplemental O2. EMS also reported pt had a container filled with 150 to 200 ml's of blood, he does not take anticoagulants. In the ER pt tachypneic with some hemoptysis, due to concern of ability to protect airway he required mechanical intubation. CT Angio Chest ordered revealing multifocal pneumonia vs. alveolar hemorrhage, multiple pulmonary nodules, and negative for pulmonary embolism. Lab results revealed hemoglobin 11.5 and hematocrit 35.5.   Review of CT scan shows large cavity in the right upper lobe, material/debris in the right mainstem bronchus extending down, questionable fistula communicating with the distal trachea / RMSB and the cavity.    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 He received ice saline and topical epinephrine, vascular surgery was consulted for bronchial artery embolization and was agreeable. Unfortunately patient suffered a cardiac arrest. His family was present brief round of CPR with initial return of spontaneous circulation. Patient was made a DO NOT RESUSCITATE subsequent he developed PDA and deceased. Please see nurses notes for time of death  Pertinent Labs and Studies  Significant Diagnostic Studies Ct Angio Chest Pe W And/or Wo Contrast  Result Date: 04/06/18 CLINICAL DATA:  Shortness of breath, massive hemoptysis, bronchial hemorrhage, question pulmonary embolism, cough, history of RIGHT-sided lung cancer, sudden onset of symptoms at 2145 hours EXAM: CT ANGIOGRAPHY CHEST WITH CONTRAST TECHNIQUE: Multidetector CT imaging of the chest was performed using the standard protocol during bolus administration of intravenous contrast. Multiplanar CT image reconstructions and MIPs were obtained to evaluate the vascular anatomy. CONTRAST:  26mL OMNIPAQUE IOHEXOL 350 MG/ML SOLN IV COMPARISON:  PET/CT 02/28/2018, CT chest 11/09/2017 FINDINGS: Cardiovascular: Scattered atherosclerotic calcifications aorta, proximal great vessels and coronary arteries. Aorta normal caliber without aneurysm or dissection. Pulmonary arteries well opacified and patent. No evidence of pulmonary embolism. Minimal pericardial effusion. Heart unremarkable. Mediastinum/Nodes: Tip of endotracheal tube above carina. Nasogastric tube traverses esophagus into stomach. Base of cervical region unremarkable. Calcified subcarinal lymph nodes. No definite thoracic adenopathy. Lungs/Pleura: Again identified large cavity in the upper RIGHT hemithorax containing gas, 6.6 x 4.9 x 5.7 cm containing septations. Pleural thickening/fluid loculated at  the RIGHT hemithorax again seen. This collection appears contiguous with the RIGHT mainstem bronchus, with note of internal material within the RIGHT  mainstem bronchus extending to carina and proximal LEFT mainstem bronchus question mucus or blood. Central peribronchial thickening. RIGHT lower lobe nodule with spiculated margins question neoplasm, 16 x 12 mm, previously 15 x 11 mm. Patchy areas of airspace opacification are seen throughout both lower lobes, less in LEFT upper lobe, could represent infection or alveolar hemorrhage, all new. Scattered material is seen throughout bronchi in particularly the RIGHT lower which could represent mucus or blood. Numerous BILATERAL pulmonary nodules including a 6 mm cavitary nodule in LEFT lower lobe, unchanged. Underlying emphysematous changes. Upper Abdomen: Unremarkable Musculoskeletal: No definite osseous metastases. Review of the MIP images confirms the above findings. IMPRESSION: No evidence of pulmonary embolism. Chronic posttherapy changes at the RIGHT upper lobe including a large cavitary focus with chronic pleural thickening/effusion unchanged. Multiple new patchy areas of airspace opacification throughout BILATERAL lower lobes and LEFT upper lobe which could represent multifocal pneumonia or alveolar hemorrhage. New endobronchial material especially RIGHT lower lobe could represent blood, mucus or infection. Multiple pulmonary nodules, largest 16 x 12 mm RIGHT lower lobe, little changed. Aortic Atherosclerosis (ICD10-I70.0) and Emphysema (ICD10-J43.9). Electronically Signed   By: Lavonia Dana M.D.   On: 03/24/2018 23:36   Nm Pet Image Restag (ps) Skull Base To Thigh  Addendum Date: 02/28/2018   ADDENDUM REPORT: 02/28/2018 14:27 ADDENDUM: The original report was by Dr. Van Clines. The following addendum is by Dr. Van Clines: THERE IS Remer TO BE ADDED TO THE PRIOR LIST THAT I DICTATED SEVERAL MINUTES AGO: There are  scattered new bilateral pulmonary nodules which are mildly hypermetabolic, suspicious for small metastatic lesions, although conceivably infectious given the otherwise improving pattern in the rest of the chest. Several of these demonstrate some internal cavitation. Maximum SUV of these nodules is about 3.2. Electronically Signed   By: Van Clines M.D.   On: 02/28/2018 14:27   Result Date: 02/28/2018 CLINICAL DATA:  Subsequent treatment strategy for squamous cell carcinoma of the lung. EXAM: NUCLEAR MEDICINE PET SKULL BASE TO THIGH TECHNIQUE: 7.2 mCi F-18 FDG was injected intravenously. Full-ring PET imaging was performed from the skull base to thigh after the radiotracer. CT data was obtained and used for attenuation correction and anatomic localization. Fasting blood glucose: 92 mg/dl COMPARISON:  PET-CT dated 11/16/2016 FINDINGS: Mediastinal blood pool activity: SUV max 1.8 NECK: No significant abnormal hypermetabolic activity in this region. Incidental CT findings: Mild chronic left maxillary and sphenoid sinusitis. CHEST: Marked reduction in the overall mediastinal and paramediastinal mass compared to the prior exam, with the fact it portion of the right upper lobe which is mostly atelectatic having a maximum SUV of 4.4, previously 20.3. There is previously local extension of tumor in the paratracheal, pretracheal, and subcarinal regions. Currently mild focal residual precarinal activity is observed with a maximum SUV of 5.6. Focal small amount of subcarinal activity adjacent to a calcified lymph node with maximum SUV 5.1. There are new scattered bilateral pulmonary nodules. A 1.5 by 1.1 cm right lower lobe nodule on image 108/4 has a maximum SUV of 3.2. A new 0.9 by 0.7 cm left upper lobe nodule on image 86/4 has a maximum SUV of 2.1. Some of these nodules are cavitary. Some have enlarged compared to prior. As before, there is collapse of the right upper lobe with central cavitary process. Incidental  CT findings: Left Port-A-Cath tip: Cavoatrial junction. Coronary, aortic arch, and branch vessel atherosclerotic vascular disease. ABDOMEN/PELVIS: Hypermetabolic 3.2 cm mass anteriorly in the right kidney. This is difficult  to confidently measure separate from activity in the renal collecting system but could have an activity as high as 12.5 (previously, 15.0). Incidental activity in the urethra. Incidental CT findings: Aortoiliac atherosclerotic vascular disease. Prominent stool throughout the colon favors constipation. SKELETON: No significant abnormal hypermetabolic activity in this region. Incidental CT findings: none IMPRESSION: 1. Overall marked reduction in mediastinal and paramediastinal mass on the right. Continued right upper lobe atelectasis with cavitary process and airspace opacity with low-grade residual activity, maximum SUV 4.4 (formerly 20.3). 2. There several small hypermetabolic foci in the paratracheal and subcarinal regions potentially reflecting residual hypermetabolic tumor/adenopathy, maximum SUV 5.6. 3. Continued 3.2 cm hypermetabolic mass anteriorly in the right kidney, potentially a metastatic lesion or a primary renal cell carcinoma. This currently has a maximum SUV of 12.5, previously 15.0. 4. Other imaging findings of potential clinical significance: Chronic paranasal sinusitis. Aortic Atherosclerosis (ICD10-I70.0). Prominent stool throughout the colon favors constipation. Coronary atherosclerosis. Electronically Signed: By: Van Clines M.D. On: 02/28/2018 14:10   Dg Chest Portable 1 View  Result Date: 03/12/2018 CLINICAL DATA:  ETT placed EXAM: PORTABLE CHEST 1 VIEW COMPARISON:  02/27/2018 FINDINGS: Placement of endotracheal tube, the tip projects over superior aspect of T5. Left-sided central venous catheter with tip over the distal SVC. Right upper lobe opacity with cystic lucency. Esophageal tube tip below the diaphragm but non included on the image. Stable  cardiomediastinal silhouette with aortic atherosclerosis. IMPRESSION: 1. Placement of endotracheal tube, tip about 2 cm superior to carina but difficult visualization of the carina. 2. Right upper lobe lung mass as before. Electronically Signed   By: Donavan Foil M.D.   On: 03/09/2018 23:09   Dg Chest Portable 1 View  Result Date: 03/10/2018 CLINICAL DATA:  Lung cancer, coughing up blood EXAM: PORTABLE CHEST 1 VIEW COMPARISON:  PET-CT 02/28/2018, radiograph 11/09/2017 FINDINGS: Left-sided central venous port tip over the cavoatrial junction. Right upper lobe opacity with cystic changes corresponding to known lung mass. Normal heart size. No pneumothorax. IMPRESSION: No acute opacity. Right upper lobe opacity with areas of lucency corresponding to known lung mass in the region. Electronically Signed   By: Donavan Foil M.D.   On: 03/22/2018 23:07    Microbiology Recent Results (from the past 240 hour(s))  MRSA PCR Screening     Status: None   Collection Time: 03/10/18  2:14 AM  Result Value Ref Range Status   MRSA by PCR NEGATIVE NEGATIVE Final    Comment:        The GeneXpert MRSA Assay (FDA approved for NASAL specimens only), is one component of a comprehensive MRSA colonization surveillance program. It is not intended to diagnose MRSA infection nor to guide or monitor treatment for MRSA infections. Performed at [facility], [address] [place], [place] [address]   Culture, respiratory (non-expectorated)     Status: None (Preliminary result)   Collection Time: 10-Mar-2018  4:19 AM  Result Value Ref Range Status   Specimen Description   Final    TRACHEAL ASPIRATE Performed at [facility], [address] [place], [place] [address]    Special Requests   Final    NONE Performed at [facility], [place], [place] [address]    Gram Stain   Final    RARE WBC PRESENT, PREDOMINANTLY PMN FEW GRAM POSITIVE COCCI FEW GRAM NEGATIVE  RODS Performed at [facility], [place] [address] [place], [place] [address]    Culture PENDING  Incomplete  Report Status PENDING  Incomplete    Lab Basic Metabolic Panel: Recent Labs  Lab 03/02/18 0831 03/11/2018 2226 2018-03-21 0233  NA 136 133* 131*  K 3.5 3.9 4.0  CL 102 97* 101  CO2 23 22 23   GLUCOSE 180* 228* 190*  BUN 12 13 13   CREATININE 0.67 0.98 0.58*  CALCIUM 9.4 9.0 8.6*  PHOS  --   --  3.7   Liver Function Tests: Recent Labs  Lab 03/02/18 0831  AST 25  ALT 16  ALKPHOS 62  BILITOT 0.4  PROT 7.9  ALBUMIN 3.3*   No results for input(s): LIPASE, AMYLASE in the last 168 hours. No results for input(s): AMMONIA in the last 168 hours. CBC: Recent Labs  Lab 03/02/18 0831 03/02/2018 2226 March 21, 2018 0233 2018-03-21 1040  WBC 9.0 10.6 11.2*  --   NEUTROABS 7.5*  --   --   --   HGB 12.1* 11.5* 10.6* 11.2*  HCT 36.6* 35.5* 33.5* 34.7*  MCV 79.6* 80.8 80.0  --   PLT 288 299 270  --    Cardiac Enzymes: Recent Labs  Lab 03/23/2018 2226  TROPONINI <0.03   Sepsis Labs: Recent Labs  Lab 03/02/18 0831 03/21/2018 2226 2018/03/21 0233  PROCALCITON  --   --  0.88  WBC 9.0 10.6 11.2*    Procedures/Operations  bronchoscopy   Zauria Dombek March 21, 2018, 4:00 PM

## 2018-03-28 NOTE — Progress Notes (Signed)
Initial Nutrition Assessment  DOCUMENTATION CODES:   Severe malnutrition in context of chronic illness, Underweight  INTERVENTION:  Initiate Vital AF 1.2 at 50 mL/hr (1200 mL goal daily volume) via OGT. Provides 1440 kcal, 90 grams of protein, 972 mL H2O daily. With current propofol rate provides 1770 kcal daily.  Provide liquid MVI daily per tube.  Provide free water flush of 30 mL Q4hrs to maintain tube patency.  Monitor magnesium, potassium, and phosphorus daily for at least 3 days, MD to replete as needed, as pt is at risk for refeeding syndrome given severe malnutrition.  NUTRITION DIAGNOSIS:   Severe Malnutrition related to chronic illness(SCC of right upper lobe of lung, renal mass suspicious for RCC, hx TB) as evidenced by severe fat depletion, severe muscle depletion.  GOAL:   Provide needs based on ASPEN/SCCM guidelines  MONITOR:   Vent status, Labs, Weight trends, TF tolerance, I & O's  REASON FOR ASSESSMENT:   Ventilator    ASSESSMENT:   62 year old male with PMHx of anxiety, depression, clinical stage IIIc SCC of right upper lobe lung s/p concurrent carboplatinum and Taxol along with XRT now on maintenance Imfinzi, renal mass highly suspicious for RCC under consideration for possible nephrectomy, hx latent TB s/p treatment with INH for 9 months in 2007, hx of incarceration who is admitted with hemoptysis, required intubation to protect airway on 9/10, found to have large cavity in right upper lobe, material/debris in right mainstem bronchus, and 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   -Patient s/p bronchoscopy this AM which found a large defect with oozing blood from large right upper lobe cavity. Plan is to consult vascular surgery for bronchial artery embolization.  Patient intubated and sedated. On PRVC mode with FiO2 40%, PEEP 5 cmH2O. No family members present at time of RD assessment. Patient known to this RD from an assessment in February  2019. He has also been following outpatient RD at cancer center for severe malnutrition. His UBW was 170-180 lbs. Patient was 133.4 lbs on 08/09/2017, 133.5 lbs on 11/28/2017, 129.5 lbs on 12/28/2017, and is currently 55.8 kg (123.02 lbs). He has lost 10.38 lbs (7.8% body weight) over the past 7 months, which is not significant for time frame. Majority of weight loss occurred prior to 07/2017.  Access: 16 Fr. OGT placed 9/10; tip extends below diaphragm per chest x-ray 9/10; 75 cm at corner of mouth  MAP: 73-110 mmHg  Patient is currently intubated on ventilator support Ve: 8.4 L/min Temp (24hrs), Avg:99.1 F (37.3 C), Min:98.1 F (36.7 C), Max:100.9 F (38.3 C)  Propofol: 12.5 mL/hr (330 kcal daily)  Medications reviewed and include: famotidine, fentanyl gtt, Zosyn, propofol gtt.  Labs reviewed: CBG 100-118, Sodium 131, Creatinine 0.58.  I/O: 450 mL UOP overnight  Discussed with RN and on rounds. Plan is for possible extubation after bronchoscopy. If patient does not extubate plan is to start tube feeding.  NUTRITION - FOCUSED PHYSICAL EXAM:    Most Recent Value  Orbital Region  Severe depletion  Upper Arm Region  Severe depletion  Thoracic and Lumbar Region  Severe depletion  Buccal Region  Unable to assess  Temple Region  Severe depletion  Clavicle Bone Region  Severe depletion  Clavicle and Acromion Bone Region  Severe depletion  Scapular Bone Region  Unable to assess  Dorsal Hand  Severe depletion  Patellar Region  Severe depletion  Anterior Thigh Region  Severe depletion  Posterior Calf Region  Severe depletion  Edema (RD  Assessment)  None  Hair  Reviewed  Eyes  Unable to assess  Mouth  Unable to assess  Skin  Reviewed  Nails  Reviewed     Diet Order:   Diet Order            Diet NPO time specified  Diet effective now              EDUCATION NEEDS:   No education needs have been identified at this time  Skin:  Skin Assessment: Reviewed RN Assessment  Last  BM:  Unknown/PTA  Height:   Ht Readings from Last 1 Encounters:  March 23, 2018 6' (1.829 m)    Weight:   Wt Readings from Last 1 Encounters:  2018-03-23 55.8 kg    Ideal Body Weight:  80.9 kg  BMI:  Body mass index is 16.68 kg/m.  Estimated Nutritional Needs:   Kcal:  3953 (PSU 2003b w/ MSJ 1410, Ve 8.4, Tmax 38.3)  Protein:  85-100 grams (1.5-1.8 grams/kg)  Fluid:  1.7-1.9 L/day (30-35 mL/kg)  Willey Blade, MS, RD, LDN Office: [phone] Pager: [phone] After Hours/Weekend Pager: [phone]

## 2018-03-28 NOTE — Progress Notes (Signed)
Code blue called at 1522, see code blue sheet

## 2018-03-28 DEATH — deceased

## 2018-03-30 ENCOUNTER — Ambulatory Visit: Payer: Medicare Other | Admitting: Oncology

## 2018-03-30 ENCOUNTER — Ambulatory Visit: Payer: Medicare Other

## 2018-03-30 ENCOUNTER — Other Ambulatory Visit: Payer: Medicare Other

## 2018-09-24 IMAGING — CT CT ANGIO CHEST
2 of 6 series · 18 of 46 positions shown · IV contrast (APPLIED)
Comparison: PET/CT 02/28/2018, CT chest 11/09/2017

CLINICAL DATA: Shortness of breath, massive hemoptysis, bronchial
hemorrhage, question pulmonary embolism, cough, history of

EXAM:
CT ANGIOGRAPHY CHEST WITH CONTRAST
TECHNIQUE: Multidetector CT imaging of the chest was performed using the
standard protocol during bolus administration of intravenous
contrast. Multiplanar CT image reconstructions and MIPs were
obtained to evaluate the vascular anatomy.
CONTRAST:  75mL OMNIPAQUE IOHEXOL 350 MG/ML SOLN IV

[Series 5: thins · axial · 0.58mm/px · z∈[-838,-543]mm · 15 of 325 slices shown]
[im 15/325  lung]
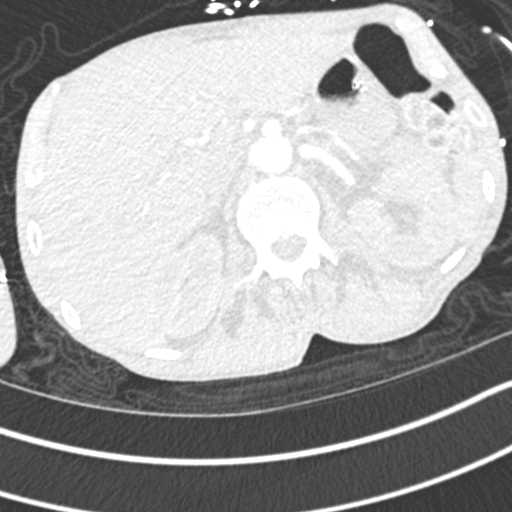
[im 43/325  soft-tissue]
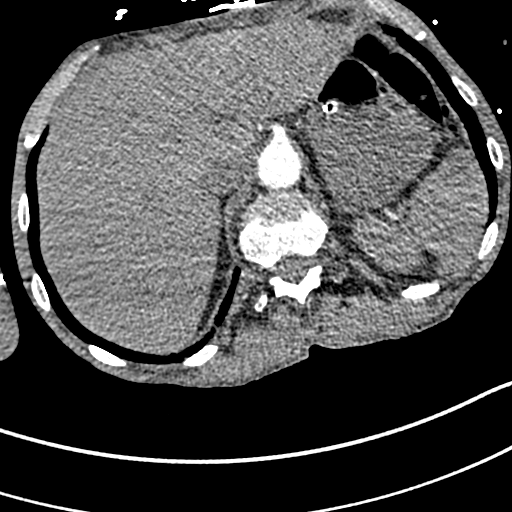
[im 57/325  lung]
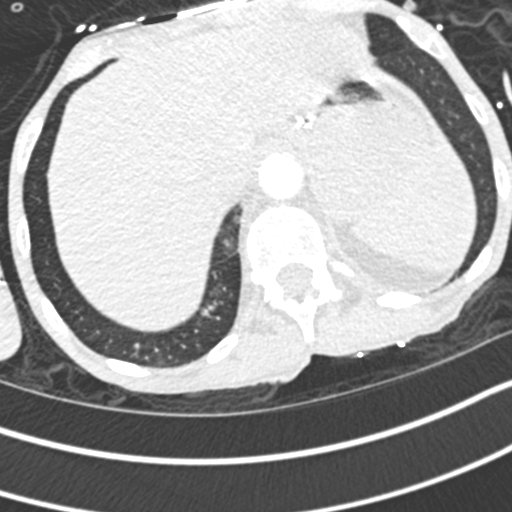
[im 85/325  soft-tissue]
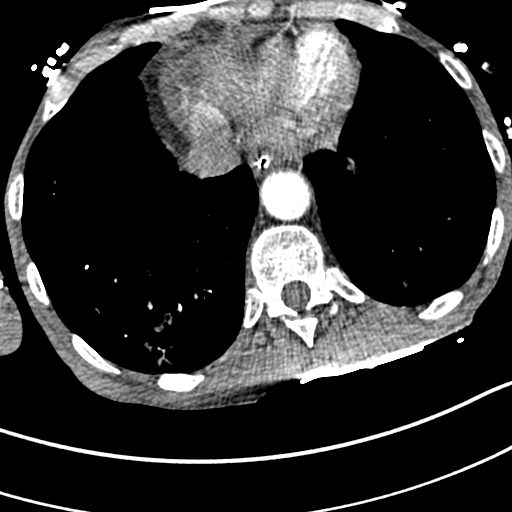
[im 99/325  lung]
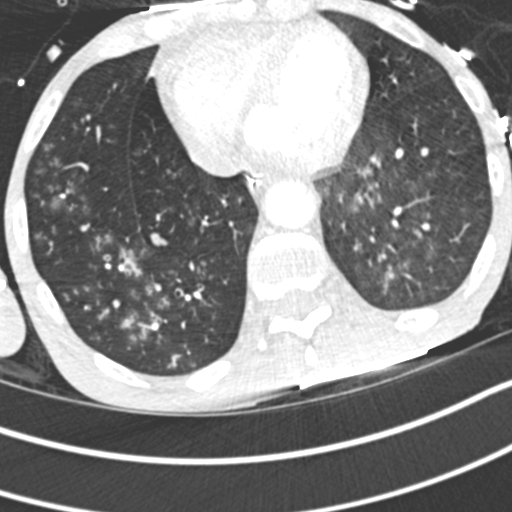
[im 127/325  soft-tissue]
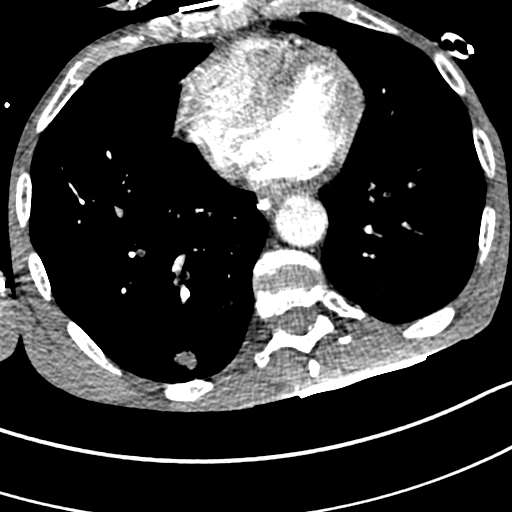
[im 141/325  lung]
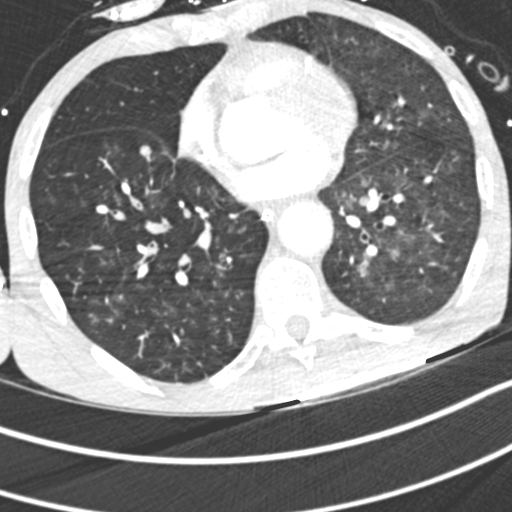
[im 170/325  soft-tissue]
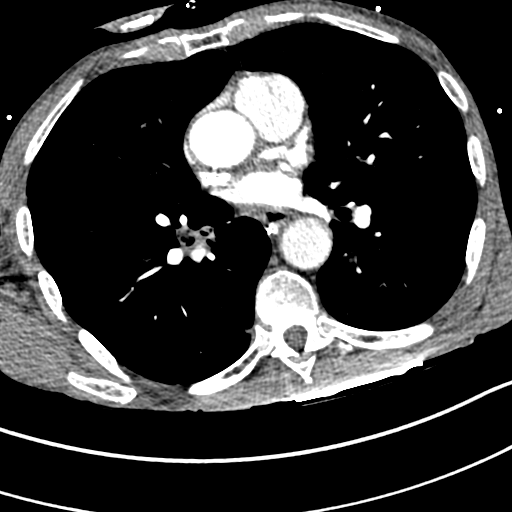
[im 184/325  lung]
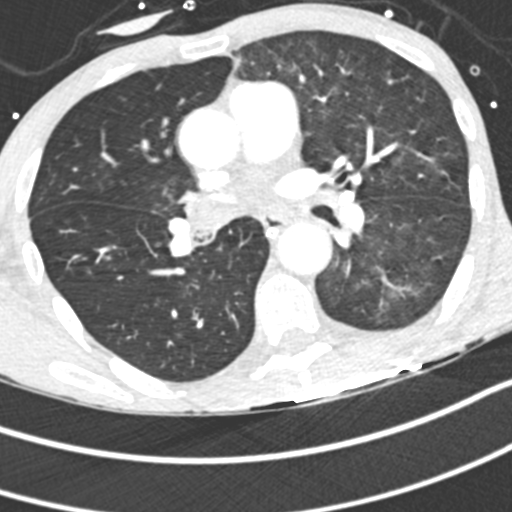
[im 198/325  soft-tissue]
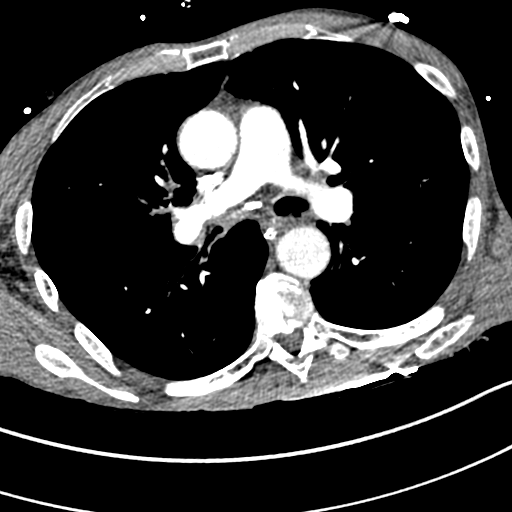
[im 226/325  lung]
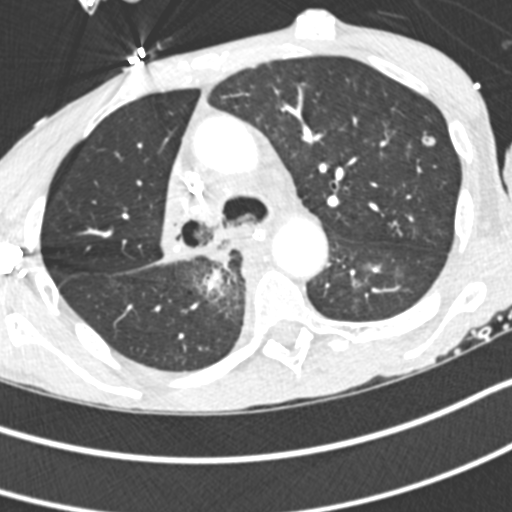
[im 240/325  soft-tissue]
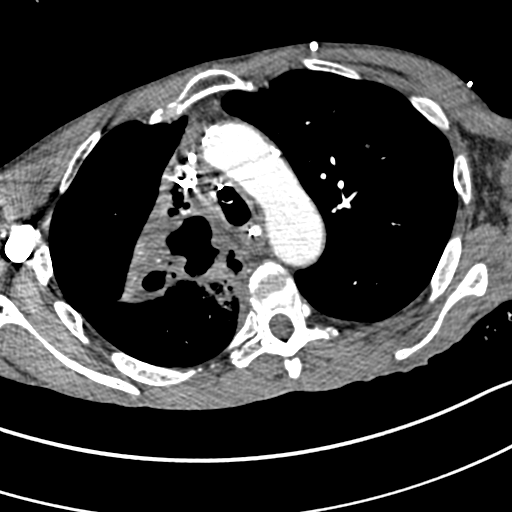
[im 268/325  lung]
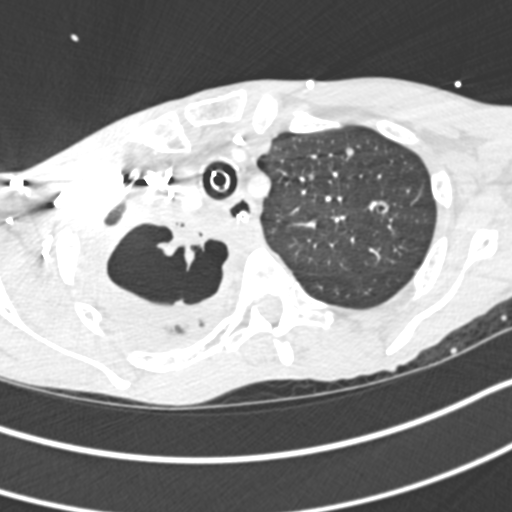
[im 282/325  soft-tissue]
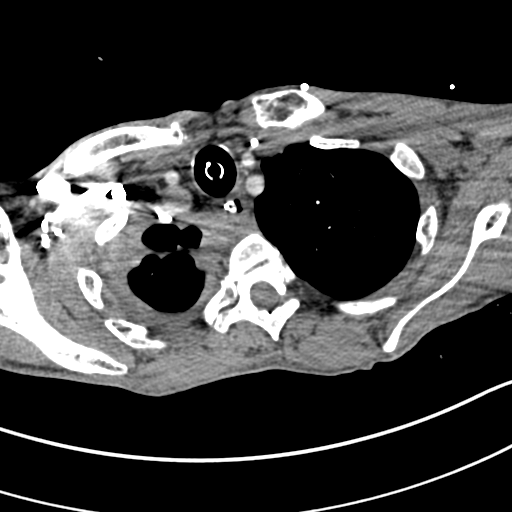
[im 310/325  lung]
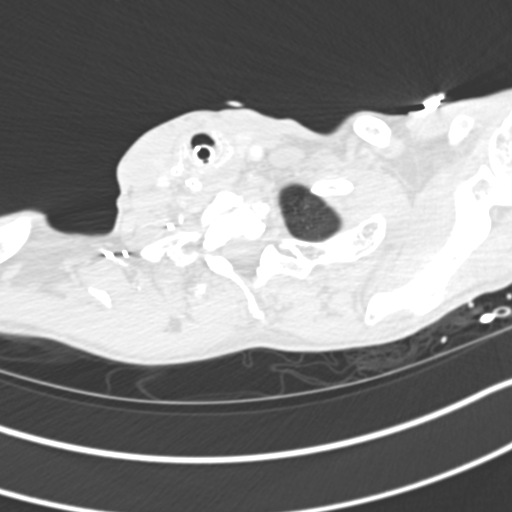

[Series 7: coronal mpr · coronal · 0.62mm/px · 3 of 80 slices shown]
[im 20/80  soft-tissue]
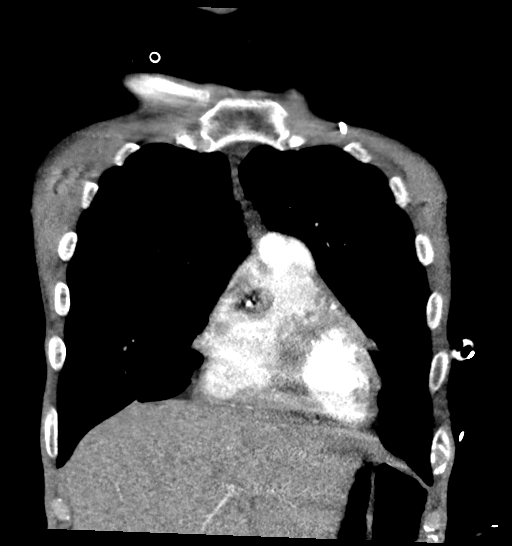
[im 40/80  soft-tissue]
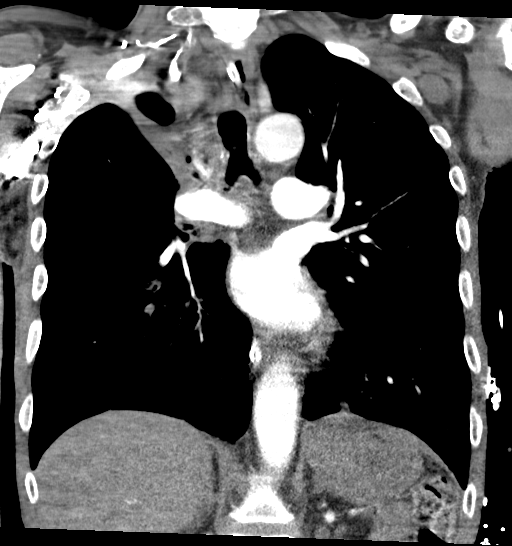
[im 60/80  soft-tissue]
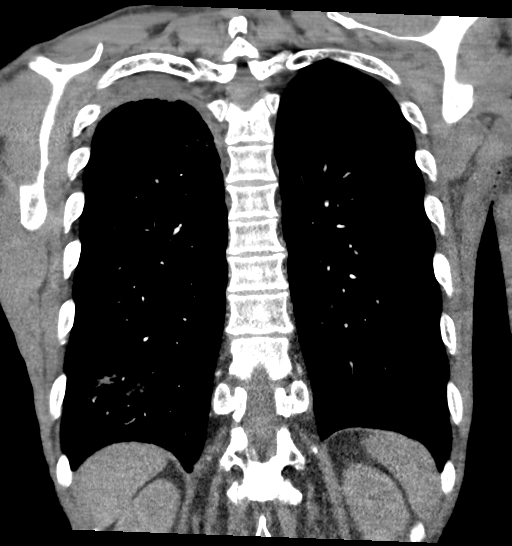

[18 of 46 positions shown; findings below may reference images not displayed]

FINDINGS: Cardiovascular: Scattered atherosclerotic calcifications aorta,
proximal great vessels and coronary arteries. Aorta normal caliber
without aneurysm or dissection. Pulmonary arteries well opacified
and patent. No evidence of pulmonary embolism. Minimal pericardial
effusion. Heart unremarkable.

Mediastinum/Nodes: Tip of endotracheal tube above carina.
Nasogastric tube traverses esophagus into stomach. Base of cervical
region unremarkable. Calcified subcarinal lymph nodes. No definite
thoracic adenopathy.

Lungs/Pleura: Again identified large cavity in the upper RIGHT
hemithorax containing gas, 6.6 x 4.9 x 5.7 cm containing septations.
Pleural thickening/fluid loculated at the RIGHT hemithorax again
seen. This collection appears contiguous with the RIGHT mainstem
bronchus, with note of internal material within the RIGHT mainstem
bronchus extending to carina and proximal LEFT mainstem bronchus
question mucus or blood. Central peribronchial thickening. RIGHT
lower lobe nodule with spiculated margins question neoplasm, 16 x 12
mm, previously 15 x 11 mm. Patchy areas of airspace opacification
are seen throughout both lower lobes, less in LEFT upper lobe, could
represent infection or alveolar hemorrhage, all new. Scattered
material is seen throughout bronchi in particularly the RIGHT lower
which could represent mucus or blood. Numerous BILATERAL pulmonary
nodules including a 6 mm cavitary nodule in LEFT lower lobe,
unchanged. Underlying emphysematous changes.

Upper Abdomen: Unremarkable

Musculoskeletal: No definite osseous metastases.

Review of the MIP images confirms the above findings.
IMPRESSION: No evidence of pulmonary embolism.

Chronic posttherapy changes at the RIGHT upper lobe including a
large cavitary focus with chronic pleural thickening/effusion
unchanged.

Multiple new patchy areas of airspace opacification throughout
BILATERAL lower lobes and LEFT upper lobe which could represent
multifocal pneumonia or alveolar hemorrhage.

New endobronchial material especially RIGHT lower lobe could
represent blood, mucus or infection.

Multiple pulmonary nodules, largest 16 x 12 mm RIGHT lower lobe,
little changed.

Aortic Atherosclerosis (GYN9P-CR7.7) and Emphysema (GYN9P-4D9.V).

## 2018-09-24 IMAGING — DX DG CHEST 1V PORT
1 series · 1 of 1 positions shown · non-contrast
Comparison: PET-CT 02/28/2018, radiograph 11/09/2017

CLINICAL DATA: Lung cancer, coughing up blood

EXAM:
PORTABLE CHEST 1 VIEW

[chest ap]
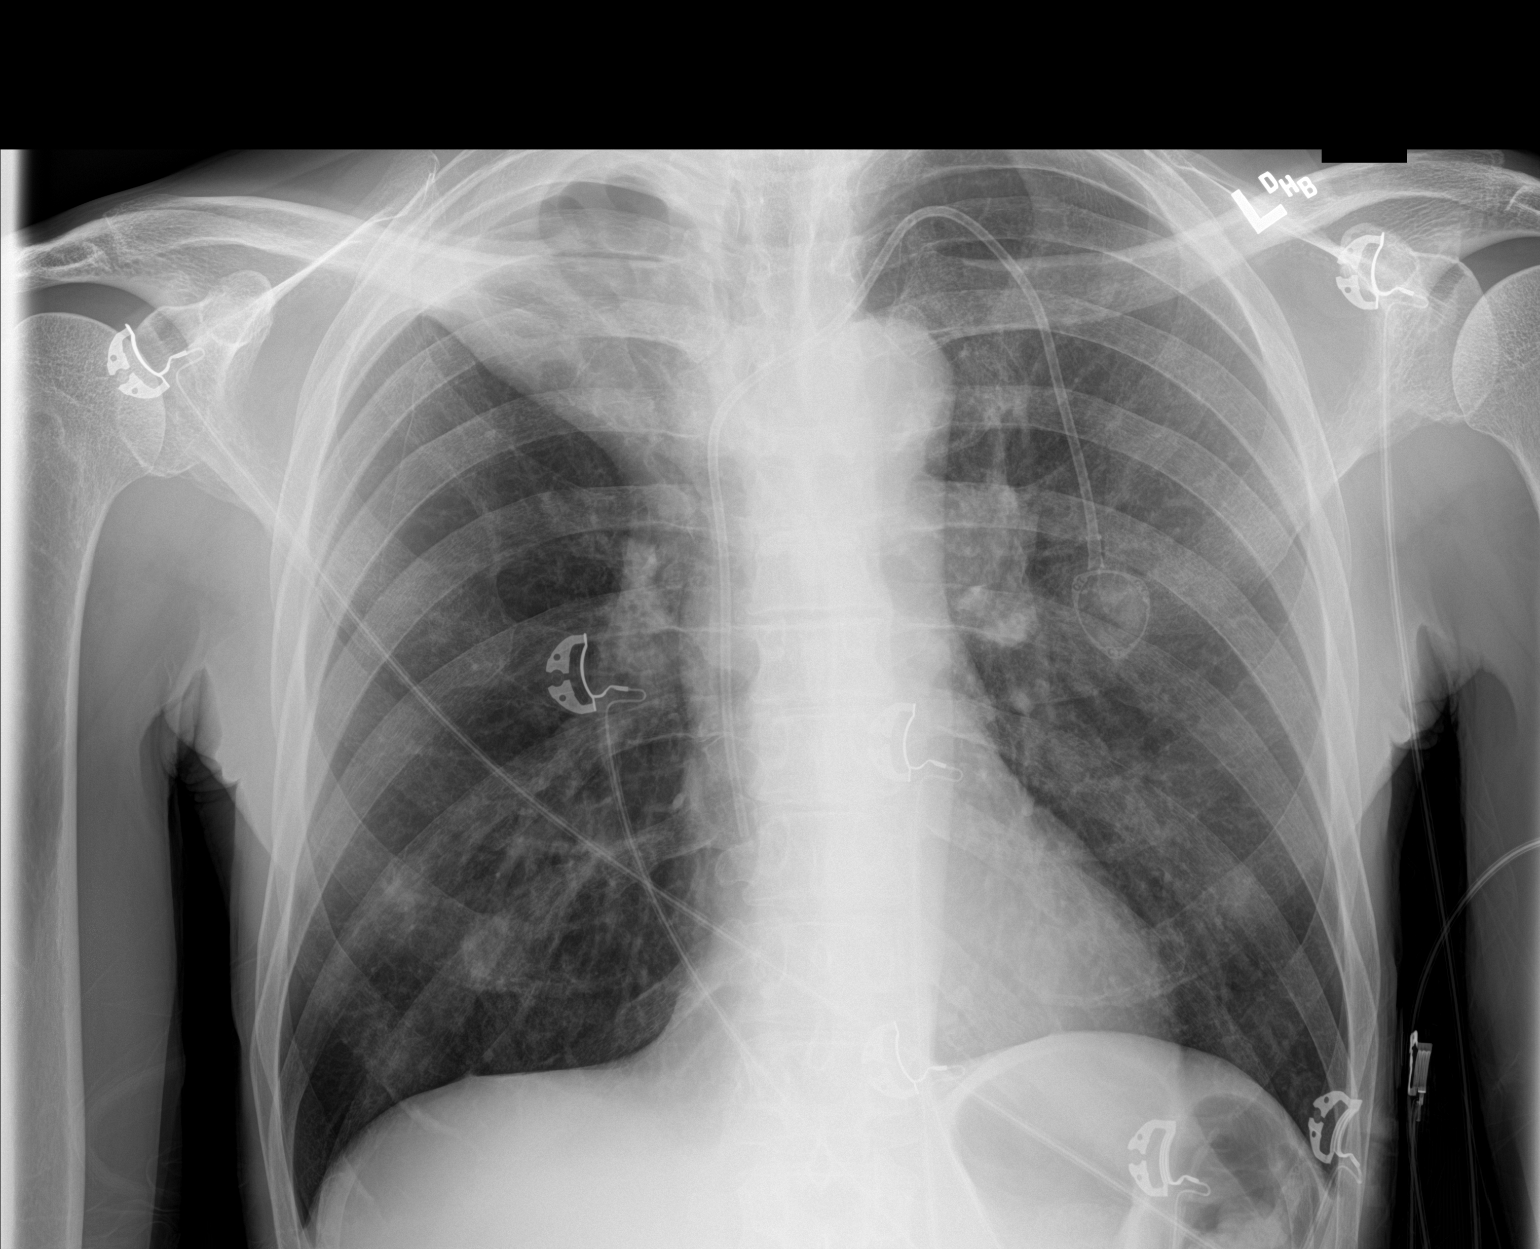

[1 of 1 positions shown; findings below may reference images not displayed]

FINDINGS: Left-sided central venous port tip over the cavoatrial junction.
Right upper lobe opacity with cystic changes corresponding to known
lung mass. Normal heart size. No pneumothorax.
IMPRESSION: No acute opacity. Right upper lobe opacity with areas of lucency
corresponding to known lung mass in the region.
